# Patient Record
Sex: Male | Born: 1945
Health system: Southern US, Community
[De-identification: ages and names within clinical notes are randomized; demographics above are authoritative.]

## PROBLEM LIST (undated history)

## (undated) ENCOUNTER — Emergency Department (HOSPITAL_COMMUNITY): Admission: EM | Source: Home / Self Care

## (undated) DIAGNOSIS — E785 Hyperlipidemia, unspecified: Secondary | ICD-10-CM

## (undated) DIAGNOSIS — N529 Male erectile dysfunction, unspecified: Secondary | ICD-10-CM

## (undated) DIAGNOSIS — Z860101 Personal history of adenomatous and serrated colon polyps: Secondary | ICD-10-CM

## (undated) DIAGNOSIS — Z86718 Personal history of other venous thrombosis and embolism: Secondary | ICD-10-CM

## (undated) DIAGNOSIS — Z972 Presence of dental prosthetic device (complete) (partial): Secondary | ICD-10-CM

## (undated) DIAGNOSIS — I2510 Atherosclerotic heart disease of native coronary artery without angina pectoris: Secondary | ICD-10-CM

## (undated) DIAGNOSIS — I440 Atrioventricular block, first degree: Secondary | ICD-10-CM

## (undated) DIAGNOSIS — R351 Nocturia: Secondary | ICD-10-CM

## (undated) DIAGNOSIS — I491 Atrial premature depolarization: Secondary | ICD-10-CM

## (undated) DIAGNOSIS — I252 Old myocardial infarction: Secondary | ICD-10-CM

## (undated) DIAGNOSIS — Z86010 Personal history of colonic polyps: Secondary | ICD-10-CM

## (undated) DIAGNOSIS — Z7901 Long term (current) use of anticoagulants: Secondary | ICD-10-CM

## (undated) DIAGNOSIS — K449 Diaphragmatic hernia without obstruction or gangrene: Secondary | ICD-10-CM

## (undated) DIAGNOSIS — R42 Dizziness and giddiness: Secondary | ICD-10-CM

## (undated) DIAGNOSIS — K219 Gastro-esophageal reflux disease without esophagitis: Secondary | ICD-10-CM

## (undated) DIAGNOSIS — Z955 Presence of coronary angioplasty implant and graft: Secondary | ICD-10-CM

## (undated) DIAGNOSIS — I10 Essential (primary) hypertension: Secondary | ICD-10-CM

## (undated) DIAGNOSIS — Z86711 Personal history of pulmonary embolism: Secondary | ICD-10-CM

## (undated) HISTORY — PX: CORONARY ANGIOPLASTY WITH STENT PLACEMENT: SHX49

## (undated) HISTORY — PX: CARDIOVASCULAR STRESS TEST: SHX262

## (undated) HISTORY — DX: Atherosclerotic heart disease of native coronary artery without angina pectoris: I25.10

---

## 2003-11-30 ENCOUNTER — Ambulatory Visit (HOSPITAL_COMMUNITY): Admission: RE | Admit: 2003-11-30 | Discharge: 2003-11-30 | Payer: Self-pay | Admitting: Internal Medicine

## 2005-01-19 ENCOUNTER — Ambulatory Visit: Payer: Self-pay | Admitting: Orthopedic Surgery

## 2005-06-14 ENCOUNTER — Inpatient Hospital Stay (HOSPITAL_COMMUNITY): Admission: EM | Admit: 2005-06-14 | Discharge: 2005-06-17 | Payer: Self-pay | Admitting: Emergency Medicine

## 2005-06-15 DIAGNOSIS — I252 Old myocardial infarction: Secondary | ICD-10-CM

## 2005-06-15 DIAGNOSIS — Z955 Presence of coronary angioplasty implant and graft: Secondary | ICD-10-CM

## 2005-06-15 HISTORY — DX: Old myocardial infarction: I25.2

## 2005-06-15 HISTORY — DX: Presence of coronary angioplasty implant and graft: Z95.5

## 2005-07-31 ENCOUNTER — Encounter (HOSPITAL_COMMUNITY): Admission: RE | Admit: 2005-07-31 | Discharge: 2005-08-30 | Payer: Self-pay | Admitting: Cardiovascular Disease

## 2005-08-31 ENCOUNTER — Encounter (HOSPITAL_COMMUNITY): Admission: RE | Admit: 2005-08-31 | Discharge: 2005-09-30 | Payer: Self-pay | Admitting: Cardiovascular Disease

## 2005-10-02 ENCOUNTER — Encounter (HOSPITAL_COMMUNITY): Admission: RE | Admit: 2005-10-02 | Discharge: 2005-11-01 | Payer: Self-pay | Admitting: Cardiovascular Disease

## 2005-10-09 HISTORY — PX: CARDIAC CATHETERIZATION: SHX172

## 2005-11-03 ENCOUNTER — Encounter (HOSPITAL_COMMUNITY): Admission: RE | Admit: 2005-11-03 | Discharge: 2005-12-03 | Payer: Self-pay | Admitting: Cardiovascular Disease

## 2006-10-19 ENCOUNTER — Observation Stay (HOSPITAL_COMMUNITY): Admission: EM | Admit: 2006-10-19 | Discharge: 2006-10-23 | Payer: Self-pay | Admitting: Emergency Medicine

## 2006-10-22 HISTORY — PX: CARDIAC CATHETERIZATION: SHX172

## 2008-03-10 ENCOUNTER — Encounter (INDEPENDENT_AMBULATORY_CARE_PROVIDER_SITE_OTHER): Payer: Self-pay | Admitting: General Surgery

## 2008-03-10 HISTORY — PX: LAPAROSCOPIC APPENDECTOMY: SUR753

## 2008-03-11 ENCOUNTER — Inpatient Hospital Stay (HOSPITAL_COMMUNITY): Admission: EM | Admit: 2008-03-11 | Discharge: 2008-03-11 | Payer: Self-pay | Admitting: Emergency Medicine

## 2008-12-18 ENCOUNTER — Other Ambulatory Visit: Admission: RE | Admit: 2008-12-18 | Discharge: 2008-12-18 | Payer: Self-pay | Admitting: Family Medicine

## 2010-02-21 ENCOUNTER — Ambulatory Visit (HOSPITAL_COMMUNITY): Admission: RE | Admit: 2010-02-21 | Discharge: 2010-02-21 | Payer: Self-pay | Admitting: Family Medicine

## 2010-05-22 ENCOUNTER — Encounter: Payer: Self-pay | Admitting: Family Medicine

## 2010-07-29 ENCOUNTER — Encounter: Payer: Self-pay | Admitting: Gastroenterology

## 2010-08-01 ENCOUNTER — Ambulatory Visit (INDEPENDENT_AMBULATORY_CARE_PROVIDER_SITE_OTHER): Payer: Medicare Other | Admitting: Gastroenterology

## 2010-08-01 ENCOUNTER — Encounter: Payer: Self-pay | Admitting: Gastroenterology

## 2010-08-01 VITALS — BP 121/77 | HR 72 | Temp 98.0°F | Ht 69.0 in | Wt 229.0 lb

## 2010-08-01 DIAGNOSIS — K219 Gastro-esophageal reflux disease without esophagitis: Secondary | ICD-10-CM

## 2010-08-01 DIAGNOSIS — Z800 Family history of malignant neoplasm of digestive organs: Secondary | ICD-10-CM

## 2010-08-01 DIAGNOSIS — K6289 Other specified diseases of anus and rectum: Secondary | ICD-10-CM

## 2010-08-01 DIAGNOSIS — K625 Hemorrhage of anus and rectum: Secondary | ICD-10-CM

## 2010-08-01 MED ORDER — PEG 3350-KCL-NA BICARB-NACL 420 G PO SOLR: ORAL | Status: AC

## 2010-08-01 NOTE — Assessment & Plan Note (Signed)
Significant family history of colorectal cancer. Mother deceased in her 28s, 2 paternal uncles deceased in their 58s all from the disease. Brother, Melford Tullier, recently diagnosed with colorectal cancer at age 65. It has been over 6 years since patient's last colonoscopy. Colonoscopy in the near future.

## 2010-08-01 NOTE — Progress Notes (Signed)
Discussed with Dr. Jena Gauss. Patient will continue his Plavix and aspirin prior to colonoscopy.

## 2010-08-01 NOTE — Progress Notes (Signed)
Primary Care Physician:  [name], MD  Chief Complaint  Patient presents with  . Rectal Pain    burning during BM around anus    HPI:  Steven Oneal is a 65 y.o. male here for consideration of colonoscopy. His brother, Candler Ginsberg, was recently diagnosed with colorectal cancer at the age of 52. Patient also has a history of 2 paternal uncles and mother who are deceased due to colon cancer in their 66s. Patient's last colonoscopy was in August of 2005. He had pancolonic diverticula, internal hemorrhoids, single anal papila. Patient states his bowels are always loose. Lot of rectal burning with BMs for past couple of months. Tried suppositories for hemorrhoids which helped some. Sometimes sees moderate volume hematachezia. Has happened several times. Appetite good. Heartburn controlled. No dysphagia, n/v. Some lower abdominal soreness. Chronic GERD over 20 years. Has been on PPI chronically. No prior upper endoscopy.   Current Outpatient Prescriptions  Medication Sig Dispense Refill  . amLODipine (NORVASC) 5 MG tablet Take 5 mg by mouth daily.        Marland Kitchen aspirin 81 MG tablet Take 81 mg by mouth daily.        . clopidogrel (PLAVIX) 75 MG tablet Take 75 mg by mouth daily.        . diazepam (VALIUM) 10 MG tablet Take 10 mg by mouth every 6 (six) hours as needed.        . fish oil-omega-3 fatty acids 1000 MG capsule Take 3 g by mouth daily.        Marland Kitchen losartan (COZAAR) 50 MG tablet Take 50 mg by mouth daily.        . nebivolol (BYSTOLIC) 10 MG tablet Take 10 mg by mouth daily.        . niacin (NIASPAN) 1000 MG CR tablet Take 1,000 mg by mouth at bedtime.        Marland Kitchen omeprazole (PRILOSEC) 20 MG capsule Take 20 mg by mouth daily.        . pravastatin (PRAVACHOL) 40 MG tablet Take 40 mg by mouth daily.                                                           Allergies as of 08/01/2010 - Review Complete 08/01/2010  Allergen Reaction Noted  . Codeine Itching, Nausea Only, and Rash  08/01/2010    Past Medical History  Diagnosis Date  . Allergic rhinitis   . Acid reflux   . CAD (coronary artery disease)     stents in 2007  . Hypertension   . Hyperlipidemia     Past Surgical History  Procedure Date  . Appendectomy   . Cardiac surgery     two stents placed through cardiac cath, 2007/2007  . Colonoscopy 11/2003    single anal papilla and int hemorrhoids, pancolonic tics    Family History  Problem Relation Age of Onset  . Colon cancer Mother 53  . Colon cancer Paternal Uncle 66  . Colon cancer Brother 9    rectal cancer  . Colon cancer Paternal Uncle 59  . Throat cancer Brother 55  . Prostate cancer Brother     History   Social History  . Marital Status: Married    Spouse Name: N/A    Number of Children:  2  . Years of Education: N/A   Occupational History  . disablity     CAD   Social History Main Topics  . Smoking status: Never Smoker   . Smokeless tobacco: Never Used  . Alcohol Use: No  . Drug Use: No  . Sexually Active: Yes -- Male partner(s)    Birth Control/ Protection: None     spouse    ROS:  General: Negative for anorexia, weight loss, fever, chills, fatigue, weakness. Eyes: Negative for vision changes.  ENT: Negative for hoarseness, difficulty swallowing , nasal congestion. CV: Negative for chest pain, angina, palpitations, dyspnea on exertion, peripheral edema.  Respiratory: Negative for dyspnea at rest, dyspnea on exertion, cough, sputum, wheezing.  GI: See history of present illness. GU:  Negative for dysuria, hematuria, urinary incontinence, urinary frequency, nocturnal urination.  MS: Negative for joint pain, low back pain.  Derm: Negative for rash or itching.  Neuro: Negative for weakness, abnormal sensation, seizure, frequent headaches, memory loss, confusion.  Psych: Negative for anxiety, depression, suicidal ideation, hallucinations.  Endo: Negative for unusual weight change.  Heme: Negative for bruising or  bleeding. Allergy: Negative for rash or hives.    Physical Examination: BP 121/77  Pulse 72  Temp 98 F (36.7 C)  Ht 5\' 9"  (1.753 m)  Wt 229 lb (103.874 kg)  BMI 33.82 kg/m2  SpO2 97%   General: Well-nourished, well-developed in no acute distress.  Head: Normocephalic, atraumatic.   Eyes: Conjunctiva pink, no icterus. Mouth: Oropharyngeal mucosa moist and pink , no lesions erythema or exudate. Neck: Supple without thyromegaly, masses, or lymphadenopathy.  Lungs: Clear to auscultation bilaterally.  Heart: Regular rate and rhythm, no murmurs rubs or gallops.  Abdomen: Bowel sounds are normal, nontender, nondistended, no hepatosplenomegaly or masses, no abdominal bruits or    hernia , no rebound or guarding.   Extremities: No lower extremity edema.  Neuro: Alert and oriented x 4 , grossly normal neurologically.  Skin: Warm and dry, no rash or jaundice.   Psych: Alert and cooperative, normal mood and affect.

## 2010-08-01 NOTE — Assessment & Plan Note (Signed)
Several month history of chronic rectal burning and prior history of internal hemorrhoids. May be due to benign anorectal disease such as hemorrhoids or fissure. Needs colonoscopy.

## 2010-08-01 NOTE — Assessment & Plan Note (Signed)
Intermittent moderate volume hematochezia. Colonoscopy in the near future. We'll continue his aspirin and Plavix for now. Will discuss further with Dr. Jena Gauss, but given history of coronary artery disease and stents and without recent bleeding will likely continue Plavix and aspirin at time of procedure. Patient will let us know if he has recurrent GI bleeding. I have discussed the risks, alternatives, benefits with regards to but not limited to the risk of reaction to medication, bleeding, infection, perforation and the patient is agreeable to proceed. Written consent to be obtained.

## 2010-08-01 NOTE — Assessment & Plan Note (Signed)
Chronic GERD on chronic PPI. He has never had an upper endoscopy. Would offer one at this time to rule out complications of GERD such as Barrett's esophagus. I have discussed the risks, alternatives, benefits with regards to but not limited to the risk of reaction to medication, bleeding, infection, perforation and the patient is agreeable to proceed. Written consent to be obtained.

## 2010-08-09 ENCOUNTER — Encounter: Payer: Medicare Other | Admitting: Internal Medicine

## 2010-08-09 ENCOUNTER — Other Ambulatory Visit: Payer: Self-pay | Admitting: Internal Medicine

## 2010-08-09 ENCOUNTER — Ambulatory Visit (HOSPITAL_COMMUNITY)
Admission: RE | Admit: 2010-08-09 | Discharge: 2010-08-09 | Disposition: A | Discharge status: Home / Self Care | Payer: Medicare Other | Source: Ambulatory Visit | Attending: Internal Medicine | Admitting: Internal Medicine

## 2010-08-09 DIAGNOSIS — D126 Benign neoplasm of colon, unspecified: Secondary | ICD-10-CM | POA: Insufficient documentation

## 2010-08-09 DIAGNOSIS — Z7982 Long term (current) use of aspirin: Secondary | ICD-10-CM | POA: Insufficient documentation

## 2010-08-09 DIAGNOSIS — K625 Hemorrhage of anus and rectum: Secondary | ICD-10-CM | POA: Insufficient documentation

## 2010-08-09 DIAGNOSIS — K449 Diaphragmatic hernia without obstruction or gangrene: Secondary | ICD-10-CM

## 2010-08-09 DIAGNOSIS — Z860100 Personal history of colon polyps, unspecified: Secondary | ICD-10-CM | POA: Insufficient documentation

## 2010-08-09 DIAGNOSIS — Z79899 Other long term (current) drug therapy: Secondary | ICD-10-CM | POA: Insufficient documentation

## 2010-08-09 DIAGNOSIS — Z800 Family history of malignant neoplasm of digestive organs: Secondary | ICD-10-CM | POA: Insufficient documentation

## 2010-08-09 DIAGNOSIS — I10 Essential (primary) hypertension: Secondary | ICD-10-CM | POA: Insufficient documentation

## 2010-08-09 DIAGNOSIS — K5730 Diverticulosis of large intestine without perforation or abscess without bleeding: Secondary | ICD-10-CM

## 2010-08-15 NOTE — Op Note (Signed)
NAME:  Steven Oneal, Steven Oneal            ACCOUNT NO.:  [id number]  MEDICAL RECORD NO.:  [id number]           PATIENT TYPE:  O  LOCATION:  DAYP                          FACILITY:  [facility]  PHYSICIAN:  R. Roetta Sessions, M.D. DATE OF BIRTH:  Mar 19, 1946  DATE OF PROCEDURE:  08/09/2010 DATE OF DISCHARGE:                              OPERATIVE REPORT   PROCEDURE:  Diagnostic EGD followed by colonoscopy with biopsy and snare polypectomy.  INDICATIONS FOR PROCEDURE:  A 65 year old gentleman with intermittent low-volume rectal bleeding, intermittent rectal burning, multiple family members with colon polyps, brother recently diagnosed with colorectal cancer, age less than 60; reflux symptoms longstanding, well-controlled on Prilosec 20 mg orally daily.  EGD and colonoscopy now being done. Risks, benefits, limitations, alternatives, imponderables have been discussed, questions answered.  Please see the documentation in the medical record.  PROCEDURE NOTE:  O2 saturation, blood pressure, pulse, respirations were monitored throughout the entire procedure.  CONSCIOUS SEDATION:  Versed 5 mg IV, Demerol 100 mg IV in divided doses.  INSTRUMENT:  Pentax video chip system.  Cetacaine spray for topical pharyngeal anesthesia.  FINDINGS:  EGD examination of tubular esophagus revealed normal mucosa. There was no evidence of Barrett's esophagus or other abnormality.  EG junction was easily traversed.  Stomach:  Gastric cavity was emptied and insufflated well with air. Thorough examination of gastric mucosa including retroflexion of proximal stomach, esophagogastric junction demonstrated only small hiatal hernia.  Pylorus was patent, easily traversed.  Examination of bulb and second portion revealed no abnormalities.  THERAPEUTIC/DIAGNOSTIC MANEUVERS PERFORMED:  None.  The patient tolerated the procedure well and was prepared for colonoscopy.  Digital rectal exam revealed no abnormalities.   Endoscopic findings:  Prep was adequate.  Colon:  Colonic mucosa was surveyed from the rectosigmoid junction through the left transverse right colon to the appendiceal orifice, ileocecal valve/cecum.  These structures were well seen and photographed for the record.  From this level, the scope was slowly and cautiously withdrawn.  All previously mucosal surfaces were again seen.  The patient did not have pancolonic diverticulosis.  There was a single dimunitive polyp in the transverse colon which was cold biopsied/removed.  In this sigmoid colon, there was a 8-mm polyp on a stalk which was hot snared and recovered for the pathologist.  The remainder of colonic mucosa appeared normal.  Scope was pulled down to the rectum where a thorough examination of rectal mucosa including retroflexed view of the anal verge.  On en face view, the anal canal demonstrated a single anal canal tag only.  The patient tolerated the procedure well.  Cecal withdrawal time 12 minutes.  IMPRESSION: 1. EGD normal esophagus, small hiatal hernia, otherwise normal stomach D1 and D2. 2. Colonoscopy findings:  Single anal canal tag, otherwise normal     rectum. 3. Pancolonic diverticula, diminutive polyp, transverse colon status     post cold biopsy removal, polyp in the sigmoid status post hot     snare polypectomy.  RECOMMENDATIONS: 1. Stop aspirin for 5 days.  The patient is to continue Plavix.  The     patient is to resume aspirin in 6 days. 2.  Daily fiber supplement recommended. 3. A 10-day course of Anusol-HC suppositories one per rectum bedtime. 4. Literature of hiatal hernia and reflux provided to Steven Oneal. 5. Prilosec has controlled the symptoms nicely, I feel he should     continue that regimen.  I feel the benefits outweigh the risks.     [name], M.D.     RMR/MEDQ  D:  08/09/2010  T:  08/10/2010  Job:  [id number]  cc:   [facility]  Electronically Signed by Lorrin Goodell  M.D. on 08/15/2010 12:34:31 PM

## 2010-08-21 ENCOUNTER — Encounter: Payer: Self-pay | Admitting: Internal Medicine

## 2010-09-13 NOTE — H&P (Signed)
NAME:  Steven, RAMSBURG NO.:  [id number]   MEDICAL RECORD NO.:  [id number]          PATIENT TYPE:  INP   LOCATION:  A312                          FACILITY:  [facility]   PHYSICIAN:  Tilford Pillar, MD      DATE OF BIRTH:  [birth year]   DATE OF ADMISSION:  03/10/2008  DATE OF DISCHARGE:  LH                              HISTORY & PHYSICAL   CHIEF COMPLAINT:  Right lower quadrant abdominal pain.   HISTORY OF PRESENT ILLNESS:  The patient is a 65 year old male with a  history of coronary artery disease, hypertension, anxiety, who presents  with right lower quadrant abdominal pain that started approximately 36  hours ago.  He described this as an acute onset in the periumbilical  region soon after eating lunch yesterday.  He has had a decrease in his  appetite since that time.  He has noticed a localization of the pain  from the periumbilical regions of the right lower quadrant.  The pain is  worse with movement and palpation.  There is no significant relieving  factors or features.  He denied any fever or chills.  He has had some  nausea but no episodes of emesis.  No change in bowel movements.  No  hematochezia.  No melena.  He has had no similar symptomatology in the  past.  He denies any shortness of breath or chest pain.   PAST MEDICAL HISTORY:  1. Hypertension.  2. Coronary artery disease with stents.  The patient had an MI in      February 2009.  3. Anxiety.   PAST SURGICAL HISTORY:  He has had coronary artery stents placed in  February 2009.   Medications are multiple and reviewed including Plavix, aspirin,  diazepam, lisinopril, isosorbide, metoprolol, Niaspan, nitroglycerin  p.r.n., simvastatin, AndroGel, Prilosec, amlodipine.   ALLERGIES:  CODEINE which causes a rash and pruritus.   SOCIAL HISTORY:  No tobacco.  No alcohol.  No recreational drug use.   REVIEW OF SYSTEMS:  CONSTITUTIONAL:  No recent weight loss.  No history  of headaches.  EYES:   Unremarkable.  EAR, NOSE, AND THROAT:  Unremarkable.  RESPIRATORY:  Unremarkable.  CARDIOVASCULAR:  He did have  a history of acute myocardial infarction in February of this year.  He  had coronary artery stenting at that time.  His last Cardiology  presentation was approximately 1-2 months ago.  The patient states that  he was told that everything was within normal limits at that  presentation.  No concerns were expressed to him at that time by his  cardiologist.  He has had no recent chest pain.  No palpitations.  GASTROINTESTINAL:  As per HPI, otherwise unremarkable.  GENITOURINARY:  Unremarkable.  MUSCULOSKELETAL:  Unremarkable.  NEUROLOGIC:  Unremarkable.  ENDOCRINE:  Unremarkable.   PHYSICAL EXAMINATION:  VITAL SIGNS:  Temperature 101.6, heart rate 100-  115, respirations 18, blood pressure 132/70.  GENERAL:  The patient is an obese-appearing male in no acute distress.  He is alert and oriented x3.  He is in a supine position in emergency  room cart.  HEENT:  Scalp no deformities.  Pupils are equal, round, and reactive.  Extraocular movements are intact.  No conjunctival pallor is noted.  Oral mucosa is pink and moist.  NECK:  Trachea is midline.  No cervical lymphadenopathy is apparent.  PULMONARY:  Unlabored respirations.  She is clear to auscultation  bilaterally.  CARDIOVASCULAR:  Tachycardic, but regular rhythm.  He has 2+ radial  pulses bilaterally.  ABDOMEN:  Bowel sounds are absent.  His abdomen is soft, obese, mildly  distended.  He does have right lower quadrant abdominal pain at McBurney  point.  He does have a positive Rovsing sign.  He has no diffuse  peritoneal signs.  No masses.  No hernias are appreciated.  EXTREMITIES:  Warm and dry.   PERTINENT LABORATORY AND RADIOGRAPHIC STUDIES:  CBC; white blood cell  count 11.5, hemoglobin 15.3, hematocrit 44.2, platelets 182, neutrophils  78, lymphocytes of 12, monocytes of 10.  Basic metabolic panel; sodium  136,  potassium 3.6, chloride 101, bicarb 26, BUN 5, creatinine 0.70,  blood glucose is elevated at 203.  CT of the abdomen and pelvis  demonstrated no evidence of any free air or free fluid.  There was  inflammatory changes in the right lower quadrant and the pericecal area  with positive fat stranding noted.  There is appendiceal dilatation and  wall thickening of the appendix.  No periappendiceal fluid is noted.  No  other masses or abnormalities are noted.   ASSESSMENT AND PLAN:  Acute appendicitis.  At this point, I did have a  discussion with the patient and the patient's wife who is present at the  time of evaluation regarding the physical examination and CT findings,  which are consistent with acute appendicitis.  I did discuss at length  the risks, benefits, and alternatives of laparoscopic possible open  appendectomy including but not limited to risk of bleeding, infection,  intra-abdominal abscess, anastomotic leak, as well as the possibility of  intraoperative cardiac and pulmonary events.  I did also discussed with  the patient his current medications including Plavix and aspirin and  discussed with the patient that this does not place him at an increased  risk for perioperative bleeding.  At this time, however due to the  emergent nature of his appendicitis, I did discuss with the patient that  these would be monitored closely and address if develop any difficulties  with postoperative or perioperative bleeding.  The patient's questions  and concerns were addressed and the patient will be consented for the  planned procedure, which includes a laparoscopic possible open  appendectomy.  This will be conducted in emergent fashion.      Tilford Pillar, MD  Electronically Signed     BZ/MEDQ  D:  03/10/2008  T:  03/11/2008  Job:  [id number]

## 2010-09-13 NOTE — Cardiovascular Report (Signed)
NAME:  Steven Oneal, Steven Oneal NO.:  [id number]   MEDICAL RECORD NO.:  [id number]          PATIENT TYPE:  INP   LOCATION:  2008                         FACILITY:  [facility]   PHYSICIAN:  Nanetta Batty, M.D.   DATE OF BIRTH:  20-Jun-1945   DATE OF PROCEDURE:  10/22/2006  DATE OF DISCHARGE:                            CARDIAC CATHETERIZATION   INDICATIONS:  Mr. Awbrey is a 64 year old, white male with history of  CAD, status post inferior wall myocardial infarction on June 15, 2005, treated with a Cypher drug-eluting stents by Dr. Lenise Herald.  He had moderate, but not critical disease in his LAD and OD.  His other  problems include hypertension and hyperlipidemia.  He was admitted on  June 20, with chest pain.  He ruled out for myocardial infarction.  He  had negative enzymes and was placed on Lovenox.  He presents now for  diagnostic coronary arteriography to define his anatomy and rule out  ischemic etiology.   DESCRIPTION OF PROCEDURE:  The patient was brought to the second floor  [facility] lab in a postabsorptive state.  He was premedicated  with p.o. Valium.  He was prepped and draped in the usual sterile  fashion.  Xylocaine 1% was used for local anesthesia.  A 6-French sheath  was inserted into the right femoral artery using standard Seldinger  technique.  A 6-French, right and left Judkins diagnostic catheter as  well as [place] pigtail catheter were used for selective coronary  angiography and left ventriculography respectively.  Visipaque dye was  used for entirety of the case.  Retrograde aortic and left ventricular  blood pressures were recorded.   HEMODYNAMIC RESULTS:  1. Aortic systolic pressure 136, diastolic pressure 66.  2. Left ventricular systolic pressure 133 and diastolic pressure 8.   Selective cholangiography:  1. Left main normal.  2. LAD:  LAD had a smooth segmental 60% proximal stenosis.  3. Left circumflex:  Free of  significant disease.  4. Ramus branch:  Free of significant disease.  5. Right coronary artery:  Patent stents in the proximal segment with      40% fairly focal stenosis just prior to that, 40% in the genu and      crux of the vessel.   Left ventriculography:  Left ventriculogram was performed using 25 mL of  Visipaque dye at 12 mL per second.  The overall LVEF was estimated at  greater than 60% without focal wall motion abnormalities.   IMPRESSION:  Mr. Samons has patent stents with noncritical coronary  artery disease.  I am not sure of the etiology of his chest pain.  Empiric antireflux therapy will be recommended as well as outpatient  Myoview to assess  the physiologic significance of his remaining coronary disease.  Sheath  was removed and pressure of the groin used to achieve hemostasis.  The  patient left in stable condition.  He will be discharged home later  tomorrow.  He will see me back in the office in approximately 2 weeks  for followup.      Nanetta Batty, M.D.  Electronically Signed  JB/MEDQ  D:  10/22/2006  T:  10/23/2006  Job:  [id number]   cc:   [facility]  [facility]  Patrica Duel, M.D.

## 2010-09-13 NOTE — H&P (Signed)
NAME:  Steven Oneal, Steven Oneal NO.:  [id number]   MEDICAL RECORD NO.:  [id number]          PATIENT TYPE:  INP   LOCATION:  6532                         FACILITY:  [facility]   PHYSICIAN:  Steven Oneal, M.D.   DATE OF BIRTH:  17-Apr-1946   DATE OF ADMISSION:  10/19/2006  DATE OF DISCHARGE:                              HISTORY & PHYSICAL   CHIEF COMPLAINTS:  Chest pain.   HISTORY OF PRESENT ILLNESS:  Mr. Steven Oneal is a 65 year old male  followed by Dr. Allyson Oneal and Dr. Patrica Oneal.  He has a history of  coronary disease.  He had an ST MI in February of 2007, treated with 2  RCA Cypher stents.  He was restudied in June of 2007 because of  recurrent chest pain.  Catheterization at that time showed a 70% LAD,  70% distal RCA, 70% optional diagonal and 80% diagonal; plan was for  medical therapy.  He did have a Myoview after his catheterization that  showed old inferior scar with no ischemia.  The patient has done pretty  well on medical therapy until this morning when he developed some left-  sided chest pain with shortness of breath.  He says his symptoms are  similar to his pre-PCI symptoms.  He became anxious.  He took  nitroglycerin with some relief.  He presented to Dr. Geanie Oneal office  and is now transferred here by EMS from Dr. Geanie Oneal office.  He is  almost pain free now.   His past medical history is remarkable for treated hypertension.  He  does have a history of anxiety.  He has had remote peptic ulcer disease  but no recent bleeding.  He has a history of dyslipidemia.  He has a  history of colonoscopy by Dr. Jena Oneal in 2005.   CURRENT MEDICATIONS:  1. Lisinopril 10 mg a day.  2. Metoprolol 50 mg b.i.d.  3. Prilosec 20 mg a day.  4. Plavix 75 mg a day.  5. Aspirin 162 mg a day.  6. Zocor 40 mg a day.  7. Imdur 30 mg a day.   HE IS INTOLERANT TO CODEINE BUT HAS NO OTHER DRUG ALLERGIES.   SOCIAL HISTORY:  Is married.  He is a nonsmoker.  He has 2 children  and  4 grandchildren.   FAMILY HISTORY:  His father died 11 of an MI; there is a family history  of colon cancer.   REVIEW OF SYSTEMS:  Essentially unremarkable except for noted above.  He  denies any recent melena or GI bleeding.  Has not had prostate trouble  or kidney trouble.  He has not had any trouble voiding.  He denies any  recent fever or chills.   PHYSICAL EXAMINATION:  Blood pressure was 160/80, pulse 70, respirations  12.  GENERAL:  He is a well-developed, well-nourished male in no acute  distress.  HEENT:  Normocephalic.  Extraocular movements are intact. Sclerae  nonicteric. Lids and conjunctivae within normal limits.  NECK:  Without bruit or JVD.  CHEST:  Clear to auscultation and percussion.  CARDIAC:  Reveals a regular rate and rhythm without murmur,  rub or  gallop.  Normal S1/S2.  ABDOMEN:  Nontender.  No hepatosplenomegaly.  EXTREMITIES:  Without edema.  Distal pulses are intact.  NEUROLOGICAL EXAM:  Grossly intact.  He is awake, alert, oriented,  cooperative.  Moves all extremities without obvious deficit.   His EKG shows sinus rhythm without acute changes.   IMPRESSION:  1. Unstable angina.  2. Coronary disease, right coronary artery percutaneous coronary      intervention with drug-eluting stents x2 February of 2007 with      moderate coronary disease on restudy June of 2007.  3. Treated hypertension.  4. Treated dyslipidemia.  5. History of anxiety.   PLAN:  The patient is admitted to telemetry.  He will be started on IV  nitroglycerin and Lovenox.  We will plan on restudy Monday.  The patient  was seen by Dr. Allyson Oneal and myself in the emergency room.      Steven Oneal, P.A.      Steven Oneal, M.D.  Electronically Signed    LKK/MEDQ  D:  10/19/2006  T:  10/20/2006  Job:  [id number]

## 2010-09-13 NOTE — Discharge Summary (Signed)
NAME:  Steven Oneal, Steven Oneal NO.:  [id number]   MEDICAL RECORD NO.:  [id number]          PATIENT TYPE:  INP   LOCATION:  2008                         FACILITY:  [facility]   PHYSICIAN:  Nanetta Batty, M.D.   DATE OF BIRTH:  12/19/1945   DATE OF ADMISSION:  10/19/2006  DATE OF DISCHARGE:                               DISCHARGE SUMMARY   DISCHARGE DIAGNOSES:  1. Coronary artery disease.  2. Chest pain.  3. Status post right coronary artery Cypher stent post ST-elevation      myocardial infarction in February 2007.  4. Hypertension.  5. Anxiety.  6. Dyslipidemia.  7. Noncritical coronary disease on catheterization.   PROCEDURE:  Left heart catheterization and coronary arteriogram were  performed on October 22, 2006, by Dr. Nanetta Batty.  Coronary arteriogram  revealed normal left main, 60% proximal stenosis of the LAD.  Circumflex  was free of significant disease.  The ramus branch was free of  significant disease.  The right coronary artery revealed patent stents  in the proximal vessel with 40% stenosis just prior to that, 40% in the  genu and crux of the vessel.  Left ventriculogram revealed an ejection  fraction of approximately 60% without wall motion abnormalities   BRIEF HISTORY:  Steven Oneal is a 65 year old male with a history of  coronary artery disease.  He is status post myocardial infarction in  February 2007 at which time he was treated with two Cypher stents to the  LAD.  He has experienced recurrent chest pain in the past which revealed  noncritical disease and medical therapy was recommended.  Followup of  that catheterization with Myoview stress test also revealed inferior  scar with no ischemia.  On the day of admission October 19, 2006, he  reported to the emergency department with left-sided chest discomfort  accompanied by shortness of breath very similar to his symptoms he had  experienced in the past.  Please see dictated history and physical for  further details.   HOSPITAL COURSE:  Steven Oneal was admitted to unit 2000 in stable  condition.  He was started on IV nitroglycerin as well as Lovenox and  plans were made to proceed with cardiac catheterization on Monday to  evaluate for any progression of his coronary artery disease.  There were  no acute changes on his EKG and he remained stable through the weekend.  His catheterization was performed on October 22, 2006, with the findings as  listed above and it was recommended that he continue his medical  management and repeat a stress Myoview as an outpatient to confirm no  evidence of ischemia, and followup by Dr. Allyson Sabal in 2 weeks.   DISCHARGE INSTRUCTIONS:  He is to keep his groin site clean and dry.  He  may shower ;ate today.  He is to keep the groin site clean and dry and  contact us with redness, swelling or drainage from the site or with  increased pain.  His followup appointments will be scheduled by our  office.  He is to see Dr. Allyson Sabal in 2 weeks and undergo stress testing  within the next two weeks prior to his visit with Dr. Allyson Sabal.   DISCHARGE MEDICATIONS:  1. Plavix 75 mg daily.  2. Metoprolol 50 mg one b.i.d.  3. Lisinopril 10 mg daily.  4. Zocor 40 mg q.h.s.  5. Prilosec OTC daily.  6. Aspirin 81 mg daily.  7. Sublingual nitroglycerin p.r.n.  8. Imdur 30 mg daily.  9. Diazepam 30 mg q.h.s.   ACTIVITIES:  He is to avoid lifting greater than 10 pounds for the next  2 weeks and avoid driving for the next 2 days.     ______________________________  Charmian Muff, NP      Nanetta Batty, M.D.  Electronically Signed    LS/MEDQ  D:  10/23/2006  T:  10/23/2006  Job:  [id number]   cc:   Patrica Duel, M.D.

## 2010-09-13 NOTE — Op Note (Signed)
NAME:  Steven Oneal, Steven Oneal NO.:  [id number]   MEDICAL RECORD NO.:  [id number]          PATIENT TYPE:  INP   LOCATION:  A312                          FACILITY:  [facility]   PHYSICIAN:  Tilford Pillar, MD      DATE OF BIRTH:  Nov 05, 1945   DATE OF PROCEDURE:  03/10/2008  DATE OF DISCHARGE:                               OPERATIVE REPORT   PREOPERATIVE DIAGNOSIS:  Acute appendicitis.   POSTOPERATIVE DIAGNOSIS:  Acute appendicitis.   PROCEDURE:  Laparoscopic appendectomy.   SURGEON:  Tilford Pillar, MD   ANESTHESIA:  General endotracheal local anesthetic 1% Sensorcaine plain.   SPECIMEN:  Appendix.   ESTIMATED BLOOD LOSS:  50 mL.   INDICATIONS:  The patient is a 65 year old male with multiple medical  problems including coronary artery disease with recent stent placed over  the last 6 months, who presented with acute abdominal pain.  He was  evaluated by the emergency room physician.  A CT evaluation of the  abdomen and pelvis was obtained, which confirmed acute appendicitis.  At  this time, surgical consultation was obtained.  I did discuss with the  patient and the patient's wife who was present during the time of the  evaluation, the risks, benefits, and alternatives of a laparoscopy,  possible open appendectomy.  I additionally discussed at length the  increased risks associated with bleeding secondarily to the patient's  history of recent Plavix and aspirin use for stent what explained the  emergent nature of his appendicitis.  He understood and wished to  proceed.  He consented for the planned procedure.   DESCRIPTION OF PROCEDURE:  The patient was taken to the operating room  and was placed in the supine position on the operating room table at  which time the general anesthetic was administered.  Once the patient  was asleep, he was endotracheally intubated by anesthesia.  At this  point, a Foley catheter was placed in standard sterile fashion by myself  without  any difficulty.  His abdomen was prepped with DuraPrep solution  and draped in standard fashion.  A stab incision was created  supraumbilically with an 11-blade scalpel, additional dissection down  through the subcutaneous tissue was carried out using a Kocher clamp,  which was utilized to grasp the anterior abdominal fascia and lifted  anteriorly.  A Veress needle was inserted.  Saline drop test was  utilized to confirm intraperitoneal placement and then pneumoperitoneum  was initiated.  Once sufficient pneumoperitoneum was obtained, an 11-mm  trocar was inserted over laparoscope allowing visualization of the  trocar entering into the peritoneal cavity.  The inner cannula was  removed.  The laparoscope was reinserted.  There was no evidence of any  trocar or Veress needle placement injury.  At this time, the remaining  trocars were placed with a 5-mm trocar in the suprapubic and a 11-mm  trocar in the left lateral abdominal wall.  These were placed with a  stab incision and placement of the trocars under direct visualization.  At this time, the cecum was identified.  There was significant amount of  inflammatory changes in  this area.  There was some omental tissue in  this area, which was well adherent.  Terminal ileum was identified and  this was mobilized and freed out of the way with gentle retraction.  The  patient was then placed in reverse Trendelenburg in left lateral  decubitus position to help with exposure.  Due to the patient's  severity, the closure was somewhat cleared with a 0-degree scope.  Therefore, a 30-degree scope was brought to the field to help with  angulation.  I could clearly see the base of the appendix as it entered  into the cecum at the termination of the taenia.  A window was created  at this point between the mesocolon and the appendix and an Endo-GIA 45  regular staple load was utilized by the base of the appendix at the  cecum.  At this point, I used the  vascular reload of the Endo-GIA 45  stapler to divide the mesoappendix.  I continued to dissect the distal  end of the appendix with a combination of blunt and suction-irrigated  dissection.  At this point, the specimen was freed and was placed in an  EndoCatch bag and removed through the umbilical trocar site.  At this  point, I reinspected the inflammatory phlegmon in this area.  There was  some oozing coming from the omental fat in this area, and at this time,  I opted to bring the harmonic scalpel to the field.  During this  continued dissection to obtain hemostasis, I visualized a distal tubular  structure adjacent to the cecum suspicious the appendix and its friable  nature might have been inadvertently divided as I continued to dissect  this free.  This clearly ran within the inflammatory tissue along the  right lateral abdominal wall, and based on its appearance, I did opt to  dissect this free using the harmonic scalpel.  With this free, I did opt  to place this also in the EndoCatch bag and remove this as well.  This  was removed in an intact EndoCatch bag and was placed with the appendix  on the back table to be sent as a permanent specimen.  I copiously  irrigated the abdominal cavity.  All the returning aspirate was clear.  I did inspect the area of dissection and did not see any active bleeding  in this area.  At this time, I turned my attention to closure.   Using an Endoclose suture passing device, I passed a 2-0 Vicryl suture  through the umbilical trocar site.  Due to limited space between the  anterior abdominal wall and the descending colon where the left lateral  abdominal trocar was located.  I opted not to use the Endoclose suture  passing device to reapproximate this prior to evacuating the  pneumoperitoneum.  I did inspect the staple line as well as the area of  dissection to reevaluate for any ongoing bleeding.  At this time, the  area appeared dry without any  active ongoing bleeding despite majority  of the patients being on the aspirin and Plavix combination.  At this  time, I opted to close.   The pneumoperitoneum was evacuated.  The trocars were removed.  The  previously placed umbilical Vicryl suture was secured.  Using a 2-0  Vicryl, the left lateral trocar site fascia was reapproximated using a  simple suture.  Local anesthetic was instilled.  A 4-0 Monocryl was  utilized to reapproximate the skin edges at all 3 trocar  sites.  The  skin was washed and dried with moist dry towel.  Benzoin was applied  around the incision.  Half-inch Steri-Strips were placed.  The drapes  were removed.  The patient was allowed to come out of general anesthetic  and was transferred back to regular hospital bed.  He was transferred to  post anesthetic area in stable condition.  At the conclusion of the  procedure, all instrument, sponge, and needle counts were correct.  On  the completion, I did examine the specimen on the back table.  I further  inspecting the appendix.  The appendix was within inflammatory phlegmon,  and therefore, the tip was somewhat obscured.  However, palpation of the  specimen did appear that the specimen was intact.  Further looking at  the second specimen, this was suspicious for a thrombosed vein likely  within the omental tongue that would appear in this area.  We will send  specimens to Pathology.      Tilford Pillar, MD  Electronically Signed     BZ/MEDQ  D:  03/11/2008  T:  03/11/2008  Job:  [id number]

## 2010-09-16 NOTE — Op Note (Signed)
NAME:  Steven Oneal, Steven Oneal                      ACCOUNT NO.:  [id number]   MEDICAL RECORD NO.:  [id number]                   PATIENT TYPE:  AMB   LOCATION:  DAY                                  FACILITY:  [facility]   PHYSICIAN:  R. Roetta Sessions, M.D.              DATE OF BIRTH:  1946-02-06   DATE OF PROCEDURE:  11/30/2003  DATE OF DISCHARGE:                                 OPERATIVE REPORT   PROCEDURE:  Colonoscopy with biopsy.   INDICATIONS:  The patient is a 65 year old gentleman with intermittent  __________  hematochezia with a positive family history of colon cancer in  two uncles and his mother who died of the disease at age 66.  He has never  had colonoscopy.  Colonoscopy is now being done and this approach has been  discussed with the patient at length.  The potential risks, benefits and  alternatives have been reviewed, questions answered and he is agreeable.  Please see documentation in medical record.   DESCRIPTION OF PROCEDURE:  Oxygen saturation, blood pressure, pulse, and  respiration were monitored throughout the entire procedure.  Conscious  sedation with Versed 4 mg IV and Demerol 75 mg IV in divided doses.  The  instrument was the Olympus video chip system.   FINDINGS:  Digital rectal exam initially revealed no abnormalities.   ENDOSCOPIC FINDINGS:  Prep was adequate.   Rectum:  Examination of the rectal mucosa including retroflexion in the anal  verge revealed a single anal papilla and internal hemorrhoids.  Rectal  mucosa otherwise appeared normal.   Colon:  Colonic mucosa was surveyed from the rectosigmoid junction through  the left, transverse,  right colon to the area of the appendiceal orifice,  ileocecal valve and cecum. These structures were well seen and photographed  for the record.  From this level,  the scope was slowly withdrawn.  All  previously mentioned mucosal surfaces were again seen.  The patient was  noted to have scattered pancolonic  diverticula.  The remainder of the  colonic mucosa appeared normal.  The patient tolerated the procedure well  and was reactive after endoscopy.   IMPRESSION:  1. Single anal papilla and internal hemorrhoids; otherwise normal rectum.  2. Pancolonic diverticula.  The remainder of the colonic mucosa appeared     normal.  3. I suspect the patient bled from hemorrhoids.   RECOMMENDATIONS:  1. Hemorrhoid and diverticulosis literature provided to Mr. Aikman.     __________  course of Anusol HC     suppositories one per rectum at bedtime.  2. The patient should go on a daily fiber supplement.  3. Given his family history of colon cancer, he should return for a     screening colonoscopy in five years.      ___________________________________________  [name], M.D.   RMR/MEDQ  D:  11/30/2003  T:  11/30/2003  Job:  [id number]   cc:   Patrica Duel, M.D.  [address], [address]  [place]  [place] [address]  Fax: [phone]

## 2010-09-16 NOTE — Cardiovascular Report (Signed)
NAME:  Steven Oneal, Steven Oneal NO.:  [id number]   MEDICAL RECORD NO.:  [id number]          PATIENT TYPE:  INP   LOCATION:  2917                         FACILITY:  [facility]   PHYSICIAN:  Darlin Priestly, MD  DATE OF BIRTH:  [birth year]   DATE OF PROCEDURE:  06/15/2005  DATE OF DISCHARGE:                              CARDIAC CATHETERIZATION   PROCEDURES PERFORMED:  1.  Left heart catheterization.  2.  Coronary angiography.  3.  Left ventriculogram.  4.  Right coronary artery-mid-proximal.      1.  Percutaneous transluminal coronary balloon angioplasty.      2.  Placement of intracoronary stent.   ATTENDING:  Darlin Priestly, M.D.   COMPLICATIONS:  None.   INDICATIONS:  Mr. Gibbons is a 65 year old male patient of Dr. Nobie Putnam  who was admitted on May 14, 2005 with a complaint of substernal chest  pain which initially began on the night of June 12, 2005.  He had  stuttering pain on and off for one or two days.  Upon his arrival to the ER  he had no significant EKG changes but he had relief of pain with  nitroglycerin.  He does subsequently rule in for MI with a peak troponin of  1.19, CK of 169 with a CK-MB of 26.9.  He is now brought for cardiac  catheterization to evaluate his coronary anatomy and possible PCI.   DESCRIPTION OF OPERATION:  After giving informed written consent, patient  brought to the cardiac catheterization laboratory.  Right and left groin  shaved, prepped, and draped in usual sterile fashion.  ECG monitor  established.  Using a modified Seldinger technique, a #6-French arterial  sheath inserted in right femoral artery.  A 6-French diagnostic catheter was  then used to perform diagnostic angiography.   Left main is a large vessel with no significant disease.   The LAD is a large vessel coursing the apex, gave rise to one diagonal  branch.  The LAD is noted to have a 70% proximal stenotic lesion.  The  remainder of the LAD does not  appear to have any significant disease.   The first diagonal was a small to medium sized vessel with 80%  ostial/proximal lesion.   Left circumflex is a large vessel coursing the AV groove and gave rise to  three obtuse marginal branches.  AV groove circumflex has no significant  disease.   The first OM was a large vessel which bifurcates distally with no  significant disease.   The second OM is a medium to large sized vessel which bifurcates distally  with a 70% mid vessel lesion.   The third OM is a small vessel with no significant disease.   There are collateral branches noted to the distal RCA from the circumflex  and LAD.   The proximal RCA is a medium to large sized vessel which is totally occluded  in its early mid portion.  As previously stated there is left to right  collateral flow into the distal RCA PAD and posterolateral branch.   Left ventriculogram reveals a preserved EF at 60%.  HEMODYNAMICS:  Systemic arterial pressure 116/68, LV systemic pressure  118/11, LVEDP of 19.   INTERVENTIONAL PROCEDURE:  RCA-mid-proximal.  Following diagnostic  angiography a #6-French JR4 guiding catheter with side holes was then  engaged in the right coronary ostium.  Next, a 0.014 Prowater guidewire was  advanced out a guiding catheter and then used to cross the mid RCA total  occlusion and positioned in the PDA without difficulty.  Next, a Voyager 2.5  x 15 mm balloon was then used to cross the mid total occlusion and six  subsequent inflations to a maximum of 16 atmospheres for a total of three  and a half minutes and then performed throughout the mid and proximal RCA  with restoration of TIMI 3 flow in the vessel.  This balloon was then  performed and a CYPHER 3 x 33 mm stent was then positioned in the mid LAD  stenotic lesion.  This stent was then deployed to 12 atmospheres for 30  seconds.  A second inflation to 16 atmospheres was performed for a total of  18 seconds.   Follow-up angiogram revealed no evidence of thrombus with TIMI  3 flow to the distal vessel, though the stent appeared to be somewhat under  sized.  The stent balloon was removed and a PowerSail 3.5 x 18 mm stent was  then inserted in the mid portion of the RCA and three subsequent inflations  to 16 atmospheres was performed throughout the proximal and mid RCA stenotic  lesions.  Follow-up angiogram revealed an excellent luminal gain.  The  ostial portion of the RCA appeared somewhat hazy and we opted to place a  second CYPHER 3.5 x 8 mm stent at the ostial portion of the RCA.  The stent  was deployed at 12 for a total of 25 seconds.  A second inflation to 14  atmospheres was performed to 24 seconds.  Follow-up angiogram revealed no  evidence of dissection or thrombus with TIMI 3 flow to the distal vessel.  There was noted to be a 60-70% lesion in the mid PLA which appeared to be a  less than 2 vessel and we elected to treat this medically.  IV Integrilin  was used throughout the case.  Intravenous doses of heparin were given to  maintain the ACT between 200-300.   Final orthogonal angiograms revealed less than 10% residual stenosis in the  proximal and mid RCA stenotic lesions with TIMI 3 flow to the distal vessel.  At this point we elected to conclude the procedure.  All balloons and  catheters were removed.  Hemostatic sheath was sewn in place and patient was  transferred back to the ward in stable condition.   CONCLUSION:  1.  Successful percutaneous transluminal coronary balloon angioplasty and      placement of a CYPHER 3 x 33 in the mid right coronary artery stenotic      lesion ultimately post dilated to 3.67 mm.  2.  Successful placement of a CYPHER 3.5 x 8 mm stent in the ostial portion      of the right coronary artery.  3.  70% residual proximal left anterior descending disease with 80% ostial     diagonal disease.  4.  70% mid obtuse marginal stenotic lesion.  5.  Normal  left ventricular function.  6.  Adjunct use of Integrilin infusion.      Darlin Priestly, MD  Electronically Signed     RHM/MEDQ  D:  06/15/2005  T:  06/15/2005  Job:  [id number]   cc:   Patrica Duel, M.D.  Fax: [phone]

## 2010-09-16 NOTE — Discharge Summary (Signed)
NAME:  ESTLE, HUGULEY NO.:  [id number]   MEDICAL RECORD NO.:  [id number]          PATIENT TYPE:  INP   LOCATION:  6531                         FACILITY:  [facility]   PHYSICIAN:  Abelino Derrick, P.A.   DATE OF BIRTH:  06-Mar-1946   DATE OF ADMISSION:  06/14/2005  DATE OF DISCHARGE:  06/17/2005                                 DISCHARGE SUMMARY   DISCHARGE DIAGNOSES:  1.  Coronary disease, subendocardial myocardial infarction this admission.  2.  Right coronary artery stenting this admission.  3.  Residual 70% obtuse marginal and 70% left anterior descending; plan is      medical therapy.  4.  Good left ventricular function.  5.  Treated hypertension.  6.  Dyslipidemia.  7.  Anxiety.   HOSPITAL COURSE:  The patient is a 65 year old male followed by Dr. Allyson Sabal  and Dr. Nobie Putnam who presented to [facility] ER with chest pain 06/12/05.  The patient is on Prilosec at home.  He was seen by Dr. Allyson Sabal consult.  He  has had remote peptic ulcer disease but no other significant coronary  disease.  He was put on heparin and nitrates, Integrilin, aspirin and beta  blocker and transferred to [facility].  The initial troponins were elevated.  He  was set up for diagnostic catheterization which was done June 15, 2005,  by Dr. Jenne Campus.  The patient had a total RCA with left-to-right collaterals.  There is also residual disease as noted above.  He underwent PTCA and  stenting to the RCA.  He received two stents, one in the mid RCA and one  ostial RCA.  He tolerated the procedure well.  His CKs peaked at 308 with 38  MBs and troponin of 3.50.  Other labs:  White count 7.2, hemoglobin 13.2,  hematocrit 37.3, platelets 169,000.  Sodium 138, potassium 3.8, BUN 5,  creatinine 0.9.  Cholesterol was 71 with HDL of 42, LDL of 96.  TSH 3.038.  Chest x-ray no acute disease.  EKG shows sinus rhythm with inferior Qs.   DISPOSITION:  The patient is discharged in stable condition.  He will follow  up  with Dr. Allyson Sabal in a couple of weeks.   DISCHARGE MEDICATIONS:  1.  Prilosec OTC once a day.  2.  Coated aspirin every day.  3.  Plavix 75 mg a day.  4.  Metoprolol 50 mg b.i.d.  5.  Isosorbide mononitrate 30 mg a day.  6.  Simvastatin 40 mg a day.  7.  Lisinopril 10 mg a day.  8.  Nitroglycerin sublingual p.r.n.      Abelino Derrick, P.Memory Dance  D:  06/17/2005  T:  06/17/2005  Job:  [id number]   cc:   Patrica Duel, M.D.  Fax: [phone]

## 2011-01-31 LAB — COMPREHENSIVE METABOLIC PANEL
ALT: 20
AST: 17
Albumin: 3.6
CO2: 26
Calcium: 9.3
GFR calc Af Amer: >60
Sodium: 136
Total Protein: 6.3

## 2011-01-31 LAB — DIFFERENTIAL
Basophils Absolute: 0
Eosinophils Absolute: 0
Eosinophils Relative: 0
Lymphocytes Relative: 8 — ABNORMAL LOW
Lymphs Abs: 0.8
Lymphs Abs: 1.4
Monocytes Absolute: 0.9
Monocytes Absolute: 1.1 — ABNORMAL HIGH
Monocytes Relative: 10
Monocytes Relative: 9
Neutro Abs: 8.7 — ABNORMAL HIGH

## 2011-01-31 LAB — BASIC METABOLIC PANEL
BUN: 4 — ABNORMAL LOW
CO2: 29
Chloride: 104
Glucose, Bld: 149 — ABNORMAL HIGH
Potassium: 4

## 2011-01-31 LAB — URINALYSIS, ROUTINE W REFLEX MICROSCOPIC
Bilirubin Urine: NEGATIVE
Nitrite: NEGATIVE
Specific Gravity, Urine: <1.005 — ABNORMAL LOW
Urobilinogen, UA: 0.2

## 2011-01-31 LAB — CBC
HCT: 39.9
Hemoglobin: 13.9
MCHC: 34.6
RBC: 4.85
RDW: 13
RDW: 13

## 2011-02-15 LAB — POCT CARDIAC MARKERS
Myoglobin, poc: 63.7
Operator id: 282751

## 2011-02-15 LAB — CBC
HCT: 41.3
HCT: 42.1
HCT: 42.8
Hemoglobin: 14.1
Hemoglobin: 14.6
MCHC: 33.8
MCHC: 34.2
MCHC: 34.6
MCV: 88
Platelets: 193
RBC: 4.7
RBC: 4.83
RDW: 13.1
RDW: 13.3

## 2011-02-15 LAB — COMPREHENSIVE METABOLIC PANEL
ALT: 27
CO2: 23
Calcium: 8.6
Creatinine, Ser: 0.79
GFR calc non Af Amer: >60
Glucose, Bld: 100 — ABNORMAL HIGH
Sodium: 134 — ABNORMAL LOW

## 2011-02-15 LAB — LIPID PANEL
Cholesterol: 113
LDL Cholesterol: 59
VLDL: 27

## 2011-02-15 LAB — PROTIME-INR: INR: 1.1

## 2011-02-15 LAB — I-STAT 8, (EC8 V) (CONVERTED LAB)
BUN: 14
Chloride: 105
Glucose, Bld: 97
Potassium: 4
TCO2: 22
pH, Ven: 7.407 — ABNORMAL HIGH

## 2011-02-15 LAB — URINALYSIS, ROUTINE W REFLEX MICROSCOPIC
Leukocytes, UA: NEGATIVE
Nitrite: NEGATIVE
Specific Gravity, Urine: 1.02
pH: 7

## 2011-02-15 LAB — TROPONIN I: Troponin I: 0.01

## 2011-02-15 LAB — BASIC METABOLIC PANEL
BUN: 10
CO2: 27
GFR calc non Af Amer: >60
Glucose, Bld: 128 — ABNORMAL HIGH
Potassium: 4.2

## 2011-02-15 LAB — DIFFERENTIAL
Eosinophils Absolute: 0.1
Lymphs Abs: 1.7
Neutrophils Relative %: 70

## 2011-02-15 LAB — APTT: aPTT: 28

## 2011-02-15 LAB — URINE MICROSCOPIC-ADD ON

## 2011-02-15 LAB — POCT I-STAT CREATININE: Operator id: 189501

## 2012-10-08 ENCOUNTER — Other Ambulatory Visit: Payer: Self-pay

## 2012-10-08 MED ORDER — PRAVASTATIN SODIUM 40 MG PO TABS: 40.0000 mg | ORAL_TABLET | Freq: Every day | ORAL | Status: DC

## 2012-10-08 NOTE — Telephone Encounter (Signed)
Rx was sent to pharmacy electronically. 

## 2012-10-23 ENCOUNTER — Other Ambulatory Visit (HOSPITAL_COMMUNITY): Payer: Self-pay | Admitting: Family Medicine

## 2012-10-23 ENCOUNTER — Ambulatory Visit (HOSPITAL_COMMUNITY)
Admission: RE | Admit: 2012-10-23 | Discharge: 2012-10-23 | Disposition: A | Discharge status: Home / Self Care | Payer: Medicare Other | Source: Ambulatory Visit | Attending: Family Medicine | Admitting: Family Medicine

## 2012-10-23 DIAGNOSIS — E279 Disorder of adrenal gland, unspecified: Secondary | ICD-10-CM | POA: Insufficient documentation

## 2012-10-23 DIAGNOSIS — R1032 Left lower quadrant pain: Secondary | ICD-10-CM

## 2012-10-23 DIAGNOSIS — R109 Unspecified abdominal pain: Secondary | ICD-10-CM

## 2012-10-23 DIAGNOSIS — R110 Nausea: Secondary | ICD-10-CM | POA: Insufficient documentation

## 2012-10-23 DIAGNOSIS — R197 Diarrhea, unspecified: Secondary | ICD-10-CM | POA: Insufficient documentation

## 2012-10-23 DIAGNOSIS — K5730 Diverticulosis of large intestine without perforation or abscess without bleeding: Secondary | ICD-10-CM | POA: Insufficient documentation

## 2012-10-23 MED ORDER — IOHEXOL 300 MG/ML  SOLN
50.0000 mL | Freq: Once | INTRAMUSCULAR | Status: AC | PRN
  Administered 2012-10-23: 50 mL via ORAL

## 2012-10-23 MED ORDER — IOHEXOL 300 MG/ML  SOLN
100.0000 mL | Freq: Once | INTRAMUSCULAR | Status: AC | PRN
  Administered 2012-10-23: 100 mL via INTRAVENOUS

## 2012-10-29 DIAGNOSIS — Z86718 Personal history of other venous thrombosis and embolism: Secondary | ICD-10-CM

## 2012-10-29 DIAGNOSIS — Z86711 Personal history of pulmonary embolism: Secondary | ICD-10-CM

## 2012-10-29 HISTORY — DX: Personal history of pulmonary embolism: Z86.711

## 2012-10-29 HISTORY — DX: Personal history of other venous thrombosis and embolism: Z86.718

## 2012-11-07 ENCOUNTER — Other Ambulatory Visit (HOSPITAL_COMMUNITY): Payer: Self-pay | Admitting: General Surgery

## 2012-11-07 DIAGNOSIS — M7989 Other specified soft tissue disorders: Secondary | ICD-10-CM

## 2012-11-07 DIAGNOSIS — M79604 Pain in right leg: Secondary | ICD-10-CM

## 2012-11-08 ENCOUNTER — Emergency Department (HOSPITAL_COMMUNITY): Payer: Medicare Other

## 2012-11-08 ENCOUNTER — Encounter (HOSPITAL_COMMUNITY): Payer: Self-pay | Admitting: *Deleted

## 2012-11-08 ENCOUNTER — Observation Stay (HOSPITAL_COMMUNITY)
Admission: EM | Admit: 2012-11-08 | Discharge: 2012-11-09 | Disposition: A | Discharge status: Home / Self Care | Payer: Medicare Other | Attending: Family Medicine | Admitting: Family Medicine

## 2012-11-08 ENCOUNTER — Ambulatory Visit (HOSPITAL_COMMUNITY)
Admission: RE | Admit: 2012-11-08 | Discharge: 2012-11-08 | Disposition: A | Discharge status: Home / Self Care | Payer: Medicare Other | Source: Ambulatory Visit | Attending: General Surgery | Admitting: General Surgery

## 2012-11-08 DIAGNOSIS — I82402 Acute embolism and thrombosis of unspecified deep veins of left lower extremity: Secondary | ICD-10-CM

## 2012-11-08 DIAGNOSIS — Z86711 Personal history of pulmonary embolism: Secondary | ICD-10-CM | POA: Diagnosis present

## 2012-11-08 DIAGNOSIS — I2699 Other pulmonary embolism without acute cor pulmonale: Principal | ICD-10-CM | POA: Insufficient documentation

## 2012-11-08 DIAGNOSIS — I82409 Acute embolism and thrombosis of unspecified deep veins of unspecified lower extremity: Secondary | ICD-10-CM

## 2012-11-08 DIAGNOSIS — Z86718 Personal history of other venous thrombosis and embolism: Secondary | ICD-10-CM | POA: Diagnosis present

## 2012-11-08 DIAGNOSIS — M79604 Pain in right leg: Secondary | ICD-10-CM

## 2012-11-08 DIAGNOSIS — R0602 Shortness of breath: Secondary | ICD-10-CM | POA: Insufficient documentation

## 2012-11-08 DIAGNOSIS — Z7901 Long term (current) use of anticoagulants: Secondary | ICD-10-CM | POA: Insufficient documentation

## 2012-11-08 DIAGNOSIS — K219 Gastro-esophageal reflux disease without esophagitis: Secondary | ICD-10-CM | POA: Diagnosis present

## 2012-11-08 DIAGNOSIS — E669 Obesity, unspecified: Secondary | ICD-10-CM | POA: Insufficient documentation

## 2012-11-08 DIAGNOSIS — Z9861 Coronary angioplasty status: Secondary | ICD-10-CM | POA: Insufficient documentation

## 2012-11-08 DIAGNOSIS — I824Y9 Acute embolism and thrombosis of unspecified deep veins of unspecified proximal lower extremity: Secondary | ICD-10-CM | POA: Insufficient documentation

## 2012-11-08 DIAGNOSIS — M7989 Other specified soft tissue disorders: Secondary | ICD-10-CM

## 2012-11-08 DIAGNOSIS — Z6833 Body mass index (BMI) 33.0-33.9, adult: Secondary | ICD-10-CM | POA: Insufficient documentation

## 2012-11-08 DIAGNOSIS — I2510 Atherosclerotic heart disease of native coronary artery without angina pectoris: Secondary | ICD-10-CM | POA: Insufficient documentation

## 2012-11-08 LAB — POCT I-STAT, CHEM 8
BUN: 4 mg/dL — ABNORMAL LOW (ref 6–23)
Calcium, Ion: 1.22 mmol/L (ref 1.13–1.30)
Chloride: 104 mEq/L (ref 96–112)
Creatinine, Ser: 0.7 mg/dL (ref 0.50–1.35)
Sodium: 141 mEq/L (ref 135–145)
TCO2: 27 mmol/L (ref 0–100)

## 2012-11-08 LAB — URINALYSIS, ROUTINE W REFLEX MICROSCOPIC
Glucose, UA: NEGATIVE mg/dL
Hgb urine dipstick: NEGATIVE
Specific Gravity, Urine: 1.01 (ref 1.005–1.030)
Urobilinogen, UA: 0.2 mg/dL (ref 0.0–1.0)

## 2012-11-08 LAB — CBC WITH DIFFERENTIAL/PLATELET
Basophils Absolute: 0 10*3/uL (ref 0.0–0.1)
Basophils Relative: 1 % (ref 0–1)
Eosinophils Relative: 4 % (ref 0–5)
HCT: 41.6 % (ref 39.0–52.0)
MCH: 31.2 pg (ref 26.0–34.0)
MCHC: 35.6 g/dL (ref 30.0–36.0)
MCV: 87.8 fL (ref 78.0–100.0)
Monocytes Absolute: 0.6 10*3/uL (ref 0.1–1.0)
RDW: 13.6 % (ref 11.5–15.5)

## 2012-11-08 MED ORDER — FLEET ENEMA 7-19 GM/118ML RE ENEM: 1.0000 | ENEMA | Freq: Once | RECTAL | Status: AC | PRN

## 2012-11-08 MED ORDER — ASPIRIN EC 81 MG PO TBEC
81.0000 mg | DELAYED_RELEASE_TABLET | Freq: Every day | ORAL | Status: DC
  Administered 2012-11-08 – 2012-11-09 (×2): 81 mg via ORAL
  Filled 2012-11-08 (×2): qty 1

## 2012-11-08 MED ORDER — IOHEXOL 350 MG/ML SOLN: 80.0000 mL | Freq: Once | INTRAVENOUS | Status: DC | PRN

## 2012-11-08 MED ORDER — NIACIN ER (ANTIHYPERLIPIDEMIC) 500 MG PO TBCR
1000.0000 mg | EXTENDED_RELEASE_TABLET | Freq: Every day | ORAL | Status: DC
  Administered 2012-11-08: 1000 mg via ORAL
  Filled 2012-11-08 (×3): qty 2

## 2012-11-08 MED ORDER — ENOXAPARIN SODIUM 120 MG/0.8ML ~~LOC~~ SOLN
1.0000 mg/kg | Freq: Two times a day (BID) | SUBCUTANEOUS | Status: DC
  Administered 2012-11-09: 105 mg via SUBCUTANEOUS
  Filled 2012-11-08 (×5): qty 0.8

## 2012-11-08 MED ORDER — ACETAMINOPHEN 325 MG PO TABS: 650.0000 mg | ORAL_TABLET | ORAL | Status: DC | PRN

## 2012-11-08 MED ORDER — SODIUM CHLORIDE 0.9 % IV SOLN
INTRAVENOUS | Status: DC
  Administered 2012-11-08 (×2): via INTRAVENOUS

## 2012-11-08 MED ORDER — ONDANSETRON HCL 4 MG/2ML IJ SOLN: 4.0000 mg | INTRAMUSCULAR | Status: DC | PRN

## 2012-11-08 MED ORDER — NIACIN 500 MG PO TABS
ORAL_TABLET | ORAL | Status: AC
  Filled 2012-11-08: qty 2

## 2012-11-08 MED ORDER — WARFARIN SODIUM 10 MG PO TABS
ORAL_TABLET | ORAL | Status: AC
  Filled 2012-11-08: qty 1

## 2012-11-08 MED ORDER — SIMVASTATIN 20 MG PO TABS: 20.0000 mg | ORAL_TABLET | Freq: Every day | ORAL | Status: DC

## 2012-11-08 MED ORDER — NEBIVOLOL HCL 2.5 MG PO TABS
ORAL_TABLET | ORAL | Status: AC
  Filled 2012-11-08: qty 4

## 2012-11-08 MED ORDER — DIAZEPAM 5 MG PO TABS: 10.0000 mg | ORAL_TABLET | Freq: Every evening | ORAL | Status: DC | PRN

## 2012-11-08 MED ORDER — WARFARIN SODIUM 10 MG PO TABS
10.0000 mg | ORAL_TABLET | Freq: Every day | ORAL | Status: DC
  Administered 2012-11-08: 10 mg via ORAL
  Filled 2012-11-08 (×2): qty 1

## 2012-11-08 MED ORDER — OMEGA-3 FATTY ACIDS 1000 MG PO CAPS: 3.0000 g | ORAL_CAPSULE | Freq: Every day | ORAL | Status: DC

## 2012-11-08 MED ORDER — AMLODIPINE BESYLATE 5 MG PO TABS
5.0000 mg | ORAL_TABLET | Freq: Every day | ORAL | Status: DC
  Administered 2012-11-08 – 2012-11-09 (×2): 5 mg via ORAL
  Filled 2012-11-08 (×2): qty 1

## 2012-11-08 MED ORDER — PANTOPRAZOLE SODIUM 40 MG PO TBEC
40.0000 mg | DELAYED_RELEASE_TABLET | Freq: Every day | ORAL | Status: DC
  Administered 2012-11-08 – 2012-11-09 (×2): 40 mg via ORAL
  Filled 2012-11-08 (×2): qty 1

## 2012-11-08 MED ORDER — WARFARIN - PHYSICIAN DOSING INPATIENT: Freq: Every day | Status: DC

## 2012-11-08 MED ORDER — SODIUM CHLORIDE 0.9 % IJ SOLN: 3.0000 mL | Freq: Two times a day (BID) | INTRAMUSCULAR | Status: DC

## 2012-11-08 MED ORDER — NEBIVOLOL HCL 10 MG PO TABS
10.0000 mg | ORAL_TABLET | Freq: Two times a day (BID) | ORAL | Status: DC
  Administered 2012-11-08 – 2012-11-09 (×2): 10 mg via ORAL
  Filled 2012-11-08 (×6): qty 1

## 2012-11-08 MED ORDER — TRAZODONE HCL 50 MG PO TABS
50.0000 mg | ORAL_TABLET | Freq: Every evening | ORAL | Status: DC | PRN
  Administered 2012-11-08: 50 mg via ORAL
  Filled 2012-11-08: qty 1

## 2012-11-08 MED ORDER — DOCUSATE SODIUM 100 MG PO CAPS
100.0000 mg | ORAL_CAPSULE | Freq: Two times a day (BID) | ORAL | Status: DC
  Administered 2012-11-08 – 2012-11-09 (×2): 100 mg via ORAL
  Filled 2012-11-08 (×2): qty 1

## 2012-11-08 MED ORDER — WARFARIN VIDEO
Freq: Once | Status: AC
  Administered 2012-11-09: 10:00:00

## 2012-11-08 MED ORDER — POTASSIUM CHLORIDE IN NACL 20-0.9 MEQ/L-% IV SOLN
INTRAVENOUS | Status: DC
  Administered 2012-11-08: 23:00:00 via INTRAVENOUS

## 2012-11-08 MED ORDER — IOHEXOL 300 MG/ML  SOLN
100.0000 mL | Freq: Once | INTRAMUSCULAR | Status: AC | PRN
  Administered 2012-11-08: 100 mL via INTRAVENOUS

## 2012-11-08 MED ORDER — OMEGA-3-ACID ETHYL ESTERS 1 G PO CAPS
1.0000 g | ORAL_CAPSULE | Freq: Every day | ORAL | Status: DC
  Administered 2012-11-09: 1 g via ORAL
  Filled 2012-11-08: qty 1

## 2012-11-08 MED ORDER — LOSARTAN POTASSIUM 50 MG PO TABS
50.0000 mg | ORAL_TABLET | Freq: Every day | ORAL | Status: DC
  Administered 2012-11-08 – 2012-11-09 (×2): 50 mg via ORAL
  Filled 2012-11-08 (×2): qty 1

## 2012-11-08 MED ORDER — ENOXAPARIN SODIUM 100 MG/ML ~~LOC~~ SOLN
100.0000 mg | Freq: Once | SUBCUTANEOUS | Status: AC
  Administered 2012-11-08: 100 mg via SUBCUTANEOUS
  Filled 2012-11-08: qty 1

## 2012-11-08 MED ORDER — CLOPIDOGREL BISULFATE 75 MG PO TABS
75.0000 mg | ORAL_TABLET | Freq: Every day | ORAL | Status: DC
  Administered 2012-11-08 – 2012-11-09 (×2): 75 mg via ORAL
  Filled 2012-11-08 (×2): qty 1

## 2012-11-08 MED ORDER — PATIENT'S GUIDE TO USING COUMADIN BOOK
Freq: Once | Status: AC
  Administered 2012-11-09: 10:00:00
  Filled 2012-11-08: qty 1

## 2012-11-08 NOTE — ED Notes (Signed)
Pt requesting a meal tray

## 2012-11-08 NOTE — H&P (Signed)
[facility] History and Physical  Steven Oneal  ZOX:[id number]  DOB: 05/13/1945   DOA: 11/08/2012   PCP:   [name], MD   Chief Complaint:  Swelling of the left leg and shortness of breath for 3 weeks  HPI: Steven Oneal is a 67 y.o. male.   Elderly overweight Caucasian gentleman, with known history of diverticulosis,  who is usually very active walks at least 2 miles each day, has been sick for about a month. He reports that because of extreme fatigue he has stopped walking for about 3 weeks, is no longer able to more his lawn. Because of his abdominal pain and history of diverticulosis it seems his primary care physician referred him to Dr. Leticia Penna for further management. Dr. Leticia Penna noted the swollen left leg and referred him to the emergency room for ultrasound evaluation. Ultrasound revealed evidence of DVT and a subsequent CT angiogram revealed pulmonary embolus.  He denies any bloody or black stool; he denies a history of lower GI bleed.  Patient denies any long-distance travel; he is not by any means a sedentary person; he is overweight; He does have a brother with a history of pulmonary embolus while being treated for lung cancer   Rewiew of Systems:   All systems negative except as marked bold or noted in the HPI;  Constitutional:    malaise, fever and chills. ;  Eyes:   eye pain, redness and discharge. ;  ENMT:   ear pain, hoarseness, nasal congestion, sinus pressure and sore throat. ;  Cardiovascular:    chest pain, palpitations, diaphoresis, dyspnea and peripheral edema.  Respiratory:   cough, hemoptysis, wheezing and stridor. ;  Gastrointestinal:  nausea, vomiting, diarrhea, constipation, abdominal pain, melena, blood in stool, hematemesis, jaundice and rectal bleeding. unusual weight loss..   Genitourinary:    frequency, dysuria, incontinence,flank pain and hematuria; Musculoskeletal:   back pain and neck pain.  swelling and trauma.;  Skin: .   pruritus, rash, abrasions, bruising and skin lesion.; ulcerations Neuro:    headache, lightheadedness and neck stiffness.  weakness, altered level of consciousness, altered mental status, extremity weakness, burning feet, involuntary movement, seizure and syncope.  Psych:    anxiety, depression, insomnia, tearfulness, panic attacks, hallucinations, paranoia, suicidal or homicidal ideation    Past Medical History  Diagnosis Date  . Allergic rhinitis   . Acid reflux   . CAD (coronary artery disease)     stents in 2007  . Hypertension   . Hyperlipidemia     Past Surgical History  Procedure Laterality Date  . Appendectomy    . Cardiac surgery      two stents placed through cardiac cath, 2007/2007  . Colonoscopy  11/2003    single anal papilla and int hemorrhoids, pancolonic tics    Medications:  HOME MEDS: Prior to Admission medications   Medication Sig Start Date End Date Taking? Authorizing Provider  amLODipine (NORVASC) 5 MG tablet Take 5 mg by mouth daily.     Yes Historical Provider, MD  aspirin 81 MG tablet Take 81 mg by mouth daily.     Yes Historical Provider, MD  clopidogrel (PLAVIX) 75 MG tablet Take 75 mg by mouth daily.     Yes Historical Provider, MD  diazepam (VALIUM) 10 MG tablet Take 10 mg by mouth every 6 (six) hours as needed.     Yes Historical Provider, MD  fish oil-omega-3 fatty acids 1000 MG capsule Take 3 g by mouth daily.  Yes Historical Provider, MD  losartan (COZAAR) 50 MG tablet Take 50 mg by mouth daily.     Yes Historical Provider, MD  Naproxen Sodium (ALEVE PO) Take 2 tablets by mouth daily as needed (pain).   Yes Historical Provider, MD  nebivolol (BYSTOLIC) 10 MG tablet Take 10 mg by mouth 2 (two) times daily.    Yes Historical Provider, MD  niacin (NIASPAN) 1000 MG CR tablet Take 1,000 mg by mouth at bedtime.     Yes Historical Provider, MD  omeprazole (PRILOSEC) 20 MG capsule Take 20 mg by mouth daily.     Yes Historical Provider, MD  pravastatin  (PRAVACHOL) 40 MG tablet Take 1 tablet (40 mg total) by mouth daily. 10/08/12  Yes Runell Gess, MD     Allergies:  Allergies  Allergen Reactions  . Codeine Itching, Nausea Only and Rash    Social History:   reports that he has never smoked. He has never used smokeless tobacco. He reports that he does not drink alcohol or use illicit drugs.  Family History: Family History  Problem Relation Age of Onset  . Colon cancer Mother 37  . Colon cancer Paternal Uncle 98  . Colon cancer Brother 13    rectal cancer  . Colon cancer Paternal Uncle 10  . Throat cancer Brother 86  . Prostate cancer Brother      Physical Exam: Filed Vitals:   11/08/12 1331 11/08/12 1756 11/08/12 1925 11/08/12 2051  BP: 123/73 119/65 144/77 129/74  Pulse: 85 85 86 84  Temp: 98 F (36.7 C) 97.5 F (36.4 C)  97.5 F (36.4 C)  TempSrc: Oral Oral  Oral  Resp: 18  24 20   Height: 5\' 9"  (1.753 m)     Weight: 103.42 kg (228 lb)     SpO2: 98% 94% 96% 99%   Blood pressure 129/74, pulse 84, temperature 97.5 F (36.4 C), temperature source Oral, resp. rate 20, height 5\' 9"  (1.753 m), weight 103.42 kg (228 lb), SpO2 99.00%. Body mass index is 33.65 kg/(m^2).   GEN:  Pleasant middle-aged Caucasian gentleman lying bed in no acute distress; cooperative with exam PSYCH:  alert and oriented x4;  anxious nor depressed; affect is appropriate. HEENT: Mucous membranes pink and anicteric; PERRLA; EOM intact;  Breasts:: Not examined CHEST WALL: No tenderness CHEST: Normal respiration, clear to auscultation bilaterally HEART: Regular rate and rhythm; no murmurs rubs or gallops BACK: No kyphosis no scoliosis; no CVA tenderness ABDOMEN: Obese, mild lower abdominal; no masses, no organomegaly, normal abdominal bowel sounds; no pannus; no intertriginous candida. Rectal Exam: Not done EXTREMITIES: age-appropriate arthropathy of the hands and knees; slightly swollen left leg ; no ulcerations. Genitalia: not  examined PULSES: 2+ and symmetric SKIN: Normal hydration no rash or ulceration CNS: Cranial nerves 2-12 grossly intact no focal lateralizing neurologic deficit   Labs on Admission:  Basic Metabolic Panel:  Recent Labs Lab 11/08/12 1613  NA 141  K 3.7  CL 104  GLUCOSE 100*  BUN 4*  CREATININE 0.70   Liver Function Tests: No results found for this basename: AST, ALT, ALKPHOS, BILITOT, PROT, ALBUMIN,  in the last 168 hours No results found for this basename: LIPASE, AMYLASE,  in the last 168 hours No results found for this basename: AMMONIA,  in the last 168 hours CBC:  Recent Labs Lab 11/08/12 1558 11/08/12 1613  WBC 6.8  --   NEUTROABS 3.8  --   HGB 14.8 14.3  HCT 41.6 42.0  MCV  87.8  --   PLT 214  --    Cardiac Enzymes: No results found for this basename: CKTOTAL, CKMB, CKMBINDEX, TROPONINI,  in the last 168 hours BNP: No components found with this basename: POCBNP,  D-dimer: No components found with this basename: D-DIMER,  CBG: No results found for this basename: GLUCAP,  in the last 168 hours  Radiological Exams on Admission: Ct Angio Chest W/cm &/or Wo Cm  11/08/2012   *RADIOLOGY REPORT*  Clinical Data: Shortness of breath, elevated D-dimer  CT ANGIOGRAPHY CHEST  Technique:  Multidetector CT imaging of the chest using the standard protocol during bolus administration of intravenous contrast. Multiplanar reconstructed images including MIPs were obtained and reviewed to evaluate the vascular anatomy.  Contrast: OMNIPAQUE IOHEXOL 300 MG/ML  SOLN  Comparison: None  Findings: Images of the thoracic inlet are unremarkable.  Mild atherosclerotic calcifications of the thoracic aorta.  No aortic aneurysm or dissection.  The study is of excellent technical quality.  Thrombus is noted in the left lower lobe pulmonary artery.  A small amount of thrombus is seen extending into at least two segmental arterial branches in  left lower lobe.  This is best seen in axial image  41.  Atherosclerotic calcifications of the coronary artery.  Heart size is within normal limits.  The visualized upper abdomen shows no adrenal gland mass.  Gallbladder is contracted.  Question tiny gallstones.  Images of the lung parenchyma shows no acute infiltrate or pulmonary edema.  No pulmonary nodules.  Sagittal images of the spine shows no destructive bony lesions.  Degenerative changes thoracic spine are noted.  IMPRESSION:  1.  Study is positive for pulmonary embolus with thrombus noted in the left lower lobe arterial branch please see axial image 41. 2.  No acute infiltrate or pulmonary edema.  No adenopathy. 3.  Contracted gallbladder.  Question small gallstones.  Critical findings discussed with Dr. Lynelle Doctor   Original Report Authenticated By: Natasha Mead, M.D.   US Venous Img Lower Bilateral  11/08/2012   *RADIOLOGY REPORT*  Clinical Data: Bilateral leg swelling  RIGHT LOWER EXTREMITY VENOUS DUPLEX ULTRASOUND  Technique:  Gray-scale sonography with graded compression, as well as color Doppler and duplex ultrasound were performed to evaluate the deep venous system of the lower extremity from the level of the common femoral vein through the popliteal and proximal calf veins. Spectral Doppler was utilized to evaluate flow at rest and with distal augmentation maneuvers.  Comparison:  None.  Findings:  Normal compressibility of the common femoral, superficial femoral, and popliteal veins is demonstrated, as well as the visualized proximal calf veins.  No filling defects to suggest DVT on grayscale or color Doppler imaging.  Doppler waveforms show normal direction of venous flow, normal respiratory phasicity and response to augmentation.  IMPRESSION: No evidence of lower extremity deep vein thrombosis.  *RADIOLOGY REPORT*  Clinical Data: Bilateral leg swelling  LEFT LOWER EXTREMITY VENOUS DUPLEX ULTRASOUND  Technique:  Gray-scale sonography with graded compression, as well as color Doppler and duplex  ultrasound, were performed to evaluate the deep venous system of the lower extremity from the level of the common femoral vein through the popliteal and proximal calf veins. Spectral Doppler was utilized to evaluate flow at rest and with distal augmentation maneuvers.  Comparison:  None.  Findings: The left common femoral vein and superficial femoral vein are patent with normal compressibility and normal Doppler signal.  There is clot in the popliteal vein.  This is partially occlusive proximally  and occlusive distally.  IMPRESSION: Segmental DVT in the left popliteal vein which is occlusive in the distal popliteal vein.   Original Report Authenticated By: Janeece Riggers, M.D.     Assessment/Plan  Active Problems:   GERD (gastroesophageal reflux disease)   DVT (deep venous thrombosis)   Pulmonary embolus   Mild obesity   PLAN: Continue Lovenox and Coumadin already started in the emergency room; Discuss plan of management Continue management of his chronic medical conditions  Other plans as per orders.  Code Status: Full code    Naphtali Zywicki Nocturnist [facility] Pager [phone]   11/08/2012, 9:17 PM

## 2012-11-08 NOTE — ED Provider Notes (Signed)
History    This chart was scribed for Ward Givens, MD, MD by Ashley Jacobs, ED Scribe. The patient was seen in room APA16A/APA16A and the patient's care was started at 1523.  CSN: [id number] Arrival date & time 11/08/12  1314  First MD Initiated Contact with Patient 11/08/12 1446     Chief Complaint  Patient presents with  . DVT    Patient is a 67 y.o. male presenting with general illness. The history is provided by the patient and medical records. No language interpreter was used.  Illness Location:  Lower left leg Severity:  Mild Onset quality:  Gradual Chronicity:  Chronic Associated symptoms: cough and shortness of breath   Associated symptoms: no fatigue    HPI Comments: Steven Oneal is a 67 y.o. male with DVTwho presents to the Emergency Department complaining of a pop behind knee 3 weeks ago followed by constant moderate left leg pain behind his left knee and on the anterior portion of his left ankle and swelling of his left leg. Pt experiences associated SOB upon walking and nonproductive cough.  He denies any chest pain. He denies any fever. He states he was recently treated about 2 weeks ago for diverticulitis when he had abdominal pain and that improved after he took the antibiotics. However he was subsequently found to also have gallstones. He was seen today by his surgeon Dr. Leticia Penna who sent him for an outpatient Doppler ultrasound of his leg which showed he did have a DVT of his popliteal vein.   Pt has a hx of 2 hearts stents  in  Feb 2007. Pt reports that he has 3 small stones in gallbladder  Pt reports that he takes 1 Asprin (81mg ) at night. Pt reports that nothing relieves or worsens the pain.    Patient states he had a brother who had colon cancer and developed a PE. He also had another brother who died of throat cancer. He states his mother died of colon cancer at age 75  PCP Dr Regino Schultze Cardiologist Dr Allyson Sabal  Past Medical History  Diagnosis Date  .  Allergic rhinitis   . Acid reflux   . CAD (coronary artery disease)     stents in 2007  . Hypertension   . Hyperlipidemia    Past Surgical History  Procedure Laterality Date  . Appendectomy    . Cardiac surgery      two stents placed through cardiac cath, 2007/2007  . Colonoscopy  11/2003    single anal papilla and int hemorrhoids, pancolonic tics   Family History  Problem Relation Age of Onset  . Colon cancer Mother 87  . Colon cancer Paternal Uncle 6  . Colon cancer Brother 68    rectal cancer  . Colon cancer Paternal Uncle 55  . Throat cancer Brother 53  . Prostate cancer Brother    History  Substance Use Topics  . Smoking status: Never Smoker   . Smokeless tobacco: Never Used  . Alcohol Use: No   Pt is retired.  Pt do not have a hx of blood clotting issues.   Review of Systems  Constitutional: Negative for fatigue.  Respiratory: Positive for cough and shortness of breath.   Cardiovascular: Positive for leg swelling (lower left leg).  Musculoskeletal:       Left knee tenderness   Hematological: Does not bruise/bleed easily.  All other systems reviewed and are negative.    Allergies  Codeine  Home Medications   Current  Outpatient Rx  Name  Route  Sig  Dispense  Refill  . amLODipine (NORVASC) 5 MG tablet   Oral   Take 5 mg by mouth daily.           Marland Kitchen aspirin 81 MG tablet   Oral   Take 81 mg by mouth daily.           . clopidogrel (PLAVIX) 75 MG tablet   Oral   Take 75 mg by mouth daily.           . diazepam (VALIUM) 10 MG tablet   Oral   Take 10 mg by mouth every 6 (six) hours as needed.           . fish oil-omega-3 fatty acids 1000 MG capsule   Oral   Take 3 g by mouth daily.           Marland Kitchen losartan (COZAAR) 50 MG tablet   Oral   Take 50 mg by mouth daily.           . nebivolol (BYSTOLIC) 10 MG tablet   Oral   Take 10 mg by mouth daily.           . niacin (NIASPAN) 1000 MG CR tablet   Oral   Take 1,000 mg by mouth at  bedtime.           Marland Kitchen omeprazole (PRILOSEC) 20 MG capsule   Oral   Take 20 mg by mouth daily.           . pravastatin (PRAVACHOL) 40 MG tablet   Oral   Take 1 tablet (40 mg total) by mouth daily.   30 tablet   9    BP 123/73  Pulse 85  Temp(Src) 98 F (36.7 C) (Oral)  Resp 18  Ht 5\' 9"  (1.753 m)  Wt 228 lb (103.42 kg)  BMI 33.65 kg/m2  SpO2 98%  Vital signs normal    Physical Exam  Nursing note and vitals reviewed. Constitutional: He is oriented to person, place, and time. He appears well-developed and well-nourished.  Non-toxic appearance. He does not appear ill. No distress.  HENT:  Head: Normocephalic and atraumatic.  Right Ear: External ear normal.  Left Ear: External ear normal.  Nose: Nose normal. No mucosal edema or rhinorrhea.  Mouth/Throat: Oropharynx is clear and moist and mucous membranes are normal. No dental abscesses or edematous.  Eyes: Conjunctivae and EOM are normal. Pupils are equal, round, and reactive to light.  Neck: Normal range of motion and full passive range of motion without pain. Neck supple.  Cardiovascular: Normal rate, regular rhythm and normal heart sounds.  Exam reveals no gallop and no friction rub.   No murmur heard. Pulmonary/Chest: Effort normal and breath sounds normal. No respiratory distress. He has no wheezes. He has no rhonchi. He has no rales. He exhibits no tenderness and no crepitus.  Abdominal: Soft. Normal appearance and bowel sounds are normal. He exhibits no distension. There is no tenderness. There is no rebound and no guarding.  Musculoskeletal: Normal range of motion. He exhibits edema and tenderness.  Diffused swelling of the left lower leg Tenderness in the popliteal area of the L knee and the ant L ankle  Neurological: He is alert and oriented to person, place, and time. He has normal strength. No cranial nerve deficit.  Skin: Skin is warm, dry and intact. No rash noted. No erythema. No pallor.  Psychiatric: He has  a normal mood and affect.  His speech is normal and behavior is normal. His mood appears not anxious.    ED Course  Procedures (including critical care time)  Medications  0.9 %  sodium chloride infusion ( Intravenous New Bag/Given 11/08/12 1607)  iohexol (OMNIPAQUE) 350 MG/ML injection 80 mL (not administered)  enoxaparin (LOVENOX) injection 100 mg (not administered)  warfarin (COUMADIN) tablet 10 mg (not administered)  iohexol (OMNIPAQUE) 300 MG/ML solution 100 mL (100 mLs Intravenous Contrast Given 11/08/12 1650)    DIAGNOSTIC STUDIES: Oxygen Saturation is 100% on room air, normal by my interpretation.    COORDINATION OF CARE: 1528 Discussed course of care with pt which includes xray and prescription treament . Pt understands and agrees.  16:32 Dr Sherwood Gambler, if chest CT negative, stop lovenox, coumadin 10 mg see Monday July 14  17:09 Radiologist called results of CT angio chest.  18:00 Pt given results of his CT angio chest.   Pt given option of being admitted to start medication or to go home with medication and he prefers to be admitted overnight   18:49 Dr Irene Limbo, hospitalist, admit to tele, obs, team 1  Results for orders placed during the hospital encounter of 11/08/12  CBC WITH DIFFERENTIAL      Result Value Range   WBC 6.8  4.0 - 10.5 K/uL   RBC 4.74  4.22 - 5.81 MIL/uL   Hemoglobin 14.8  13.0 - 17.0 g/dL   HCT 16.1  09.6 - 04.5 %   MCV 87.8  78.0 - 100.0 fL   MCH 31.2  26.0 - 34.0 pg   MCHC 35.6  30.0 - 36.0 g/dL   RDW 40.9  81.1 - 91.4 %   Platelets 214  150 - 400 K/uL   Neutrophils Relative % 55  43 - 77 %   Neutro Abs 3.8  1.7 - 7.7 K/uL   Lymphocytes Relative 31  12 - 46 %   Lymphs Abs 2.1  0.7 - 4.0 K/uL   Monocytes Relative 9  3 - 12 %   Monocytes Absolute 0.6  0.1 - 1.0 K/uL   Eosinophils Relative 4  0 - 5 %   Eosinophils Absolute 0.3  0.0 - 0.7 K/uL   Basophils Relative 1  0 - 1 %   Basophils Absolute 0.0  0.0 - 0.1 K/uL  APTT      Result Value Range    aPTT 28  24 - 37 seconds  PROTIME-INR      Result Value Range   Prothrombin Time 13.3  11.6 - 15.2 seconds   INR 1.03  0.00 - 1.49  POCT I-STAT, CHEM 8      Result Value Range   Sodium 141  135 - 145 mEq/L   Potassium 3.7  3.5 - 5.1 mEq/L   Chloride 104  96 - 112 mEq/L   BUN 4 (*) 6 - 23 mg/dL   Creatinine, Ser 7.82  0.50 - 1.35 mg/dL   Glucose, Bld 956 (*) 70 - 99 mg/dL   Calcium, Ion 2.13  0.86 - 1.30 mmol/L   TCO2 27  0 - 100 mmol/L   Hemoglobin 14.3  13.0 - 17.0 g/dL   HCT 57.8  46.9 - 62.9 %   Laboratory interpretation all normal   Doppler US of Lower Extremities 11/08/2012   *RADIOLOGY REPORT*  Clinical Data: Bilateral leg swelling  RIGHT LOWER EXTREMITY VENOUS DUPLEX ULTRASOUND  Technique:  Gray-scale sonography with graded compression, as well as color Doppler and duplex ultrasound were performed  to evaluate the deep venous system of the lower extremity from the level of the common femoral vein through the popliteal and proximal calf veins. Spectral Doppler was utilized to evaluate flow at rest and with distal augmentation maneuvers.  Comparison:  None.  Findings:  Normal compressibility of the common femoral, superficial femoral, and popliteal veins is demonstrated, as well as the visualized proximal calf veins.  No filling defects to suggest DVT on grayscale or color Doppler imaging.  Doppler waveforms show normal direction of venous flow, normal respiratory phasicity and response to augmentation.  IMPRESSION: No evidence of lower extremity deep vein thrombosis.  *RADIOLOGY REPORT*  Clinical Data: Bilateral leg swelling  LEFT LOWER EXTREMITY VENOUS DUPLEX ULTRASOUND  Technique:  Gray-scale sonography with graded compression, as well as color Doppler and duplex ultrasound, were performed to evaluate the deep venous system of the lower extremity from the level of the common femoral vein through the popliteal and proximal calf veins. Spectral Doppler was utilized to evaluate flow at  rest and with distal augmentation maneuvers.  Comparison:  None.  Findings: The left common femoral vein and superficial femoral vein are patent with normal compressibility and normal Doppler signal.  There is clot in the popliteal vein.  This is partially occlusive proximally and occlusive distally.  IMPRESSION: Segmental DVT in the left popliteal vein which is occlusive in the distal popliteal vein.   Original Report Authenticated By: Janeece Riggers, M.D.     Ct Angio Chest W/cm &/or Wo Cm  11/08/2012   *RADIOLOGY REPORT*  Clinical Data: Shortness of breath, elevated D-dimer  CT ANGIOGRAPHY CHEST  Technique:  Multidetector CT imaging of the chest using the standard protocol during bolus administration of intravenous contrast. Multiplanar reconstructed images including MIPs were obtained and reviewed to evaluate the vascular anatomy.  Contrast: OMNIPAQUE IOHEXOL 300 MG/ML  SOLN  Comparison: None  Findings: Images of the thoracic inlet are unremarkable.  Mild atherosclerotic calcifications of the thoracic aorta.  No aortic aneurysm or dissection.  The study is of excellent technical quality.  Thrombus is noted in the left lower lobe pulmonary artery.  A small amount of thrombus is seen extending into at least two segmental arterial branches in  left lower lobe.  This is best seen in axial image 41.  Atherosclerotic calcifications of the coronary artery.  Heart size is within normal limits.  The visualized upper abdomen shows no adrenal gland mass.  Gallbladder is contracted.  Question tiny gallstones.  Images of the lung parenchyma shows no acute infiltrate or pulmonary edema.  No pulmonary nodules.  Sagittal images of the spine shows no destructive bony lesions.  Degenerative changes thoracic spine are noted.  IMPRESSION:  1.  Study is positive for pulmonary embolus with thrombus noted in the left lower lobe arterial branch please see axial image 41. 2.  No acute infiltrate or pulmonary edema.  No  adenopathy. 3.  Contracted gallbladder.  Question small gallstones.  Critical findings discussed with Dr. Lynelle Doctor   Original Report Authenticated By: Natasha Mead, M.D.     Ct Abdomen Pelvis W Contrast  10/23/2012  .  IMPRESSION: No acute findings.  Sigmoid colon diverticulosis with additional right colon diverticula, but no diverticulitis.  Stones along the gallbladder wall.  These may reside in the wall. There is no evidence of acute cholecystitis.  The stones are new from prior study.  Small right adrenal mass consistent with adenoma.  Stable renal cortical thinning.  Degenerative changes of the spine.  Original Report Authenticated By: Amie Portland, M.D.      1. DVT (deep venous thrombosis), left   2. Pulmonary embolus     Plan admission   Devoria Albe, MD, FACEP   MDM    I personally performed the services described in this documentation, which was scribed in my presence. The recorded information has been reviewed and considered.  Devoria Albe, MD, Armando Gang    Ward Givens, MD 11/08/12 Ebony Cargo

## 2012-11-08 NOTE — Progress Notes (Signed)
ANTICOAGULATION CONSULT NOTE - Initial Consult  Pharmacy Consult for Warfarin Indication: DVT / PE  Allergies  Allergen Reactions  . Codeine Itching, Nausea Only and Rash    Patient Measurements: Height: 5\' 9"  (175.3 cm) Weight: 228 lb (103.42 kg) IBW/kg (Calculated) : 70.7   Vital Signs: Temp: 97.5 F (36.4 C) (07/11 2051) Temp src: Oral (07/11 2051) BP: 129/74 mmHg (07/11 2051) Pulse Rate: 84 (07/11 2051)  Labs:  Recent Labs  11/08/12 1558 11/08/12 1613  HGB 14.8 14.3  HCT 41.6 42.0  PLT 214  --   APTT 28  --   LABPROT 13.3  --   INR 1.03  --   CREATININE  --  0.70    Estimated Creatinine Clearance: 106.2 ml/min (by C-G formula based on Cr of 0.7).   Medical History: Past Medical History  Diagnosis Date  . Allergic rhinitis   . Acid reflux   . CAD (coronary artery disease)     stents in 2007  . Hypertension   . Hyperlipidemia     Medications:  Scheduled:  . amLODipine  5 mg Oral Daily  . aspirin  81 mg Oral Daily  . clopidogrel  75 mg Oral Daily  . docusate sodium  100 mg Oral BID  . [START ON 11/09/2012] enoxaparin (LOVENOX) injection  1 mg/kg Subcutaneous Q12H  . fish oil-omega-3 fatty acids  3 g Oral Daily  . losartan  50 mg Oral Daily  . nebivolol  10 mg Oral BID  . niacin  1,000 mg Oral QHS  . pantoprazole  40 mg Oral Daily  . [START ON 11/09/2012] simvastatin  20 mg Oral q1800  . sodium chloride  3 mL Intravenous Q12H  . warfarin  10 mg Oral q1800  . [START ON 11/09/2012] Warfarin - Physician Dosing Inpatient   Does not apply q1800    Assessment: VTE treatment for DVT & PE Patient on Lovenox 1 mg/kg every 12 hours Warfarin 10 mg po given tonight  Goal of Therapy:  INR 2-3 Monitor platelets by anticoagulation protocol: Yes   Plan:  No additional Warfarin tonight INR/PT daily CBC daily Monitor renal function Labs per protocol  Raquel James, Tayvin Preslar Bennett 11/08/2012,9:57 PM

## 2012-11-08 NOTE — ED Notes (Signed)
Here for outpt US - DVT to left leg.  C/o increased swelling/pain x 3 wks.

## 2012-11-08 NOTE — ED Notes (Signed)
Pt c/o pain behind left knee and swelling to left lower leg x 3 weeks.  Denies injury.  Reports saw Dr. Caesar Bookman recently for diverticulitis and was told to come here for [place] of left leg.  Pt diagnosed with blood clot.  Left leg swollen, red.  Pedal pulse audible with doppler.

## 2012-11-09 LAB — CBC
MCV: 88 fL (ref 78.0–100.0)
Platelets: 185 10*3/uL (ref 150–400)
RBC: 4.43 MIL/uL (ref 4.22–5.81)
WBC: 7.4 10*3/uL (ref 4.0–10.5)

## 2012-11-09 LAB — COMPREHENSIVE METABOLIC PANEL
ALT: 16 U/L (ref 0–53)
AST: 15 U/L (ref 0–37)
CO2: 28 mEq/L (ref 19–32)
Chloride: 105 mEq/L (ref 96–112)
Creatinine, Ser: 0.66 mg/dL (ref 0.50–1.35)
GFR calc non Af Amer: >90 mL/min (ref >90)
Total Bilirubin: 0.9 mg/dL (ref 0.3–1.2)

## 2012-11-09 LAB — HEMOGLOBIN A1C: Hgb A1c MFr Bld: 5.8 % — ABNORMAL HIGH (ref <5.7)

## 2012-11-09 LAB — PROTIME-INR: INR: 1.13 (ref 0.00–1.49)

## 2012-11-09 MED ORDER — ENOXAPARIN SODIUM 150 MG/ML ~~LOC~~ SOLN
150.0000 mg | SUBCUTANEOUS | Status: DC
  Filled 2012-11-09 (×2): qty 1

## 2012-11-09 MED ORDER — WARFARIN - PHARMACIST DOSING INPATIENT: Status: DC

## 2012-11-09 MED ORDER — WARFARIN SODIUM 10 MG PO TABS: 10.0000 mg | ORAL_TABLET | Freq: Every day | ORAL | Status: DC

## 2012-11-09 MED ORDER — ENOXAPARIN SODIUM 120 MG/0.8ML ~~LOC~~ SOLN
SUBCUTANEOUS | Status: AC
  Filled 2012-11-09: qty 0.8

## 2012-11-09 MED ORDER — ENOXAPARIN SODIUM 150 MG/ML ~~LOC~~ SOLN: 150.0000 mg | SUBCUTANEOUS | Status: DC

## 2012-11-09 MED ORDER — WARFARIN SODIUM 10 MG PO TABS: 10.0000 mg | ORAL_TABLET | Freq: Once | ORAL | Status: DC

## 2012-11-09 NOTE — Progress Notes (Signed)
[facility] PROGRESS NOTE  Steven Oneal ZOX:[id number] DOB: 1945/06/01 DOA: 11/08/2012 PCP: [name], MD Cardiologist Dr Allyson Sabal  Assessment/Plan: 1. Acute pulmonary embolism: Asymptomatic. Continue anticoagulation. 2. Left lower remedy DVT in the popliteal vein: Somewhat improved. Continue anticoagulation. 3. History of coronary artery disease: On aspirin stent was 2007. Can likely discontinue Plavix. I will discuss with Dr. Allyson Sabal 7/14. Plavix as an outpatient.   Continue warfarin and Lovenox on discharge  Followup with primary care physician 7/14 for PT/INR  Code Status: Full DVT prophylaxis: Lovenox, warfarin Family Communication: None present Disposition Plan: Home  Brendia Sacks, MD  [facility]  Pager [phone] If 7PM-7AM, please contact night-coverage at [email or web address], password [facility] 11/09/2012, 11:44 AM  LOS: 1 day   Clinical Summary: 67 year old man presented for an outpatient lower extremity venous Doppler secondary to a swollen left leg. This was positive for DVT. In emergency department he was found pulmonary embolism and referred for admission.  Consultants:  None  Procedures:  None  HPI/Subjective: Feels well. No complaints. No chest pain or shortness of breath. Some leg pain and swelling.  Objective: Filed Vitals:   11/08/12 1756 11/08/12 1925 11/08/12 2051 11/09/12 0614  BP: 119/65 144/77 129/74 105/65  Pulse: 85 86 84 77  Temp: 97.5 F (36.4 C)  97.5 F (36.4 C) 97.6 F (36.4 C)  TempSrc: Oral  Oral Oral  Resp:  24 20 18   Height:      Weight:      SpO2: 94% 96% 99% 93%    Intake/Output Summary (Last 24 hours) at 11/09/12 1144 Last data filed at 11/09/12 0834  Gross per 24 hour  Intake 531.67 ml  Output   1125 ml  Net -593.33 ml     Filed Weights   11/08/12 1331  Weight: 103.42 kg (228 lb)    Exam:   Afebrile, vital signs stable  General: Appears calm and comfortable.  Cardiovascular: Regular rate and  rhythm. No murmur, rub, gallop.   Telemetry: Sinus rhythm. No arrhythmias.  Musculoskeletal: Left lower short he edema at ankle up mid lower leg. Nontender.  Psychiatric: Grossly normal mood and affect. Speech fluent and appropriate.  Data Reviewed:  Complete metabolic panel unremarkable. CBC normal. INR 1.13.  Urinalysis negative.  Left lower extremity DVT in the popliteal vein  CT chest positive for pulmonary embolus left lower lobe arterial branch  Pending studies:   TSH  Hemoglobin A1c  Scheduled Meds: . amLODipine  5 mg Oral Daily  . aspirin EC  81 mg Oral Daily  . clopidogrel  75 mg Oral Daily  . docusate sodium  100 mg Oral BID  . enoxaparin (LOVENOX) injection  1 mg/kg Subcutaneous Q12H  . losartan  50 mg Oral Daily  . nebivolol  10 mg Oral BID  . niacin  1,000 mg Oral QHS  . omega-3 acid ethyl esters  1 g Oral Daily  . pantoprazole  40 mg Oral Daily  . simvastatin  20 mg Oral q1800  . sodium chloride  3 mL Intravenous Q12H  . warfarin  10 mg Oral Once  . Warfarin - Pharmacist Dosing Inpatient   Does not apply Q24H   Continuous Infusions: . 0.9 % NaCl with KCl 20 mEq / L 50 mL/hr at 11/08/12 2316    Active Problems:   GERD (gastroesophageal reflux disease)   DVT (deep venous thrombosis)   Pulmonary embolus   Mild obesity

## 2012-11-09 NOTE — Progress Notes (Signed)
Pt is to be discharged home today. Pt is in NAD, IV is out, all paperwork has been reviewed/discussed with patient, and there are no questions/concerns at this time. Assessment is unchanged from this morning. Pt is to be accompanied downstairs by staff and family via wheelchair.  

## 2012-11-09 NOTE — Progress Notes (Addendum)
ANTICOAGULATION CONSULT NOTE   Pharmacy Consult for Warfarin Indication: DVT / PE  Allergies  Allergen Reactions  . Codeine Itching, Nausea Only and Rash    Patient Measurements: Height: 5\' 9"  (175.3 cm) Weight: 228 lb (103.42 kg) IBW/kg (Calculated) : 70.7   Vital Signs: Temp: 97.6 F (36.4 C) (07/12 0614) Temp src: Oral (07/12 0614) BP: 105/65 mmHg (07/12 0614) Pulse Rate: 77 (07/12 0614)  Labs:  Recent Labs  11/08/12 1558 11/08/12 1613 11/09/12 0615  HGB 14.8 14.3 13.7  HCT 41.6 42.0 39.0  PLT 214  --  185  APTT 28  --   --   LABPROT 13.3  --  14.3  INR 1.03  --  1.13  CREATININE  --  0.70 0.66  TROPONINI <0.30  --   --     Estimated Creatinine Clearance: 106.2 ml/min (by C-G formula based on Cr of 0.66).   Medical History: Past Medical History  Diagnosis Date  . Allergic rhinitis   . Acid reflux   . CAD (coronary artery disease)     stents in 2007  . Hypertension   . Hyperlipidemia     Medications:  Scheduled:  . amLODipine  5 mg Oral Daily  . aspirin EC  81 mg Oral Daily  . clopidogrel  75 mg Oral Daily  . docusate sodium  100 mg Oral BID  . enoxaparin (LOVENOX) injection  1 mg/kg Subcutaneous Q12H  . losartan  50 mg Oral Daily  . nebivolol  10 mg Oral BID  . niacin  1,000 mg Oral QHS  . omega-3 acid ethyl esters  1 g Oral Daily  . pantoprazole  40 mg Oral Daily  . [COMPLETED] patient's guide to using coumadin book   Does not apply Once  . simvastatin  20 mg Oral q1800  . sodium chloride  3 mL Intravenous Q12H  . warfarin  10 mg Oral Once  . warfarin   Does not apply Once  . Warfarin - Pharmacist Dosing Inpatient   Does not apply Q24H    Assessment: VTE treatment for DVT & PE Patient on Lovenox 1 mg/kg every 12 hours  Goal of Therapy:  INR 2-3 Monitor platelets by anticoagulation protocol: Yes   Plan:  Coumadin 10 mg po today at 4 PM INR/PT daily CBC daily, monitor platelets Monitor renal function Labs per protocol Follow up  Coumadin education  Steven Oneal, Steven Oneal 11/09/2012,9:11 AM

## 2012-11-09 NOTE — Progress Notes (Signed)
Provided pt education. Pt was given coumadin pamphlet and video has been seen by pt. Pt currently has no new questions. Will continue to monitor and assess pt's needs for further education based on taking anticoagulants at home.

## 2012-11-09 NOTE — Discharge Summary (Signed)
Physician Discharge Summary  Steven Oneal NFA:[id number] DOB: August 02, 1945 DOA: 11/08/2012  PCP: Steven Ruths, MD  Admit date: 11/08/2012 Discharge date: 11/09/2012  Recommendations for Outpatient Follow-up:  1. Followup acute pulmonary embolism, with left lower extremity DVT 2. Followup PT/INR 7/14.  Lab Results  Component Value Date   INR 1.13 11/09/2012   INR 1.03 11/08/2012   INR 1.1 10/19/2006    3. Patient on aspirin and Plavix for history of coronary artery disease. Followup with cardiology, consider discontinuing Plavix.   Follow-up Information   Follow up with Steven Ruths, MD. Schedule an appointment as soon as possible for a visit on 11/11/2012. (for PT/INR check)    Contact information:   [address] [place] [address] [phone]      Discharge Diagnoses:  1. Acute pulmonary embolism 2. Left lower extremity DVT in the popliteal vein  Discharge Condition: Improved Disposition: Home  Diet recommendation: Heart healthy  Filed Weights   11/08/12 1331  Weight: 103.42 kg (228 lb)    History of present illness:  67 year old man presented for an outpatient lower extremity venous Doppler secondary to a swollen left leg. This was positive for DVT. In emergency department he was found pulmonary embolism and referred for admission.  Hospital Course:  Steven Oneal was started on Lovenox and Coumadin. He had no chest pain or shortness of breath and telemetry is unremarkable. His hospitalization was uncomplicated. He was instructed in Lovenox and Coumadin administration and demonstrate competence. I confirmed that pharmacy has Lovenox available. He is stable for discharge and will followup with his primary care physician 7/14 for PT/INR check.  Assessment/Plan:  1. Acute pulmonary embolism: Asymptomatic. Continue anticoagulation. 2. Left lower remedy DVT in the popliteal vein: Somewhat improved. Continue  anticoagulation. 3. History of coronary artery disease: On aspirin stent was 2007. Can likely discontinue Plavix. I will discuss with Steven Oneal 7/14.   Discharge Instructions  Discharge Orders   Future Orders Complete By Expires     Diet - low sodium heart healthy  As directed     Discharge instructions  As directed     Comments:      Be sure to take Lovenox and warfarin as directed. Call your physician or seek immediate medical attention for bleeding, chest pain, shortness of breath, worsening swelling or worsening of your condition.    Increase activity slowly  As directed         Medication List    STOP taking these medications       ALEVE PO      TAKE these medications       amLODipine 5 MG tablet  Commonly known as:  NORVASC  Take 5 mg by mouth daily.     aspirin 81 MG tablet  Take 81 mg by mouth daily.     clopidogrel 75 MG tablet  Commonly known as:  PLAVIX  Take 75 mg by mouth daily.     diazepam 10 MG tablet  Commonly known as:  VALIUM  Take 10 mg by mouth every 6 (six) hours as needed.     enoxaparin 150 MG/ML injection  Commonly known as:  LOVENOX  Inject 1 mL (150 mg total) into the skin daily. Start this evening 7/12 at 6 PM     fish oil-omega-3 fatty acids 1000 MG capsule  Take 3 g by mouth daily.     losartan 50 MG tablet  Commonly known as:  COZAAR  Take 50  mg by mouth daily.     nebivolol 10 MG tablet  Commonly known as:  BYSTOLIC  Take 10 mg by mouth 2 (two) times daily.     niacin 1000 MG CR tablet  Commonly known as:  NIASPAN  Take 1,000 mg by mouth at bedtime.     omeprazole 20 MG capsule  Commonly known as:  PRILOSEC  Take 20 mg by mouth daily.     pravastatin 40 MG tablet  Commonly known as:  PRAVACHOL  Take 1 tablet (40 mg total) by mouth daily.     warfarin 10 MG tablet  Commonly known as:  COUMADIN  Take 1 tablet (10 mg total) by mouth daily. Take each evening at the same time.       Allergies  Allergen Reactions  .  Codeine Itching, Nausea Only and Rash    The results of significant diagnostics from this hospitalization (including imaging, microbiology, ancillary and laboratory) are listed below for reference.    Significant Diagnostic Studies: Ct Angio Chest W/cm &/or Wo Cm  11/08/2012   *RADIOLOGY REPORT*  Clinical Data: Shortness of breath, elevated D-dimer  CT ANGIOGRAPHY CHEST  Technique:  Multidetector CT imaging of the chest using the standard protocol during bolus administration of intravenous contrast. Multiplanar reconstructed images including MIPs were obtained and reviewed to evaluate the vascular anatomy.  Contrast: OMNIPAQUE IOHEXOL 300 MG/ML  SOLN  Comparison: None  Findings: Images of the thoracic inlet are unremarkable.  Mild atherosclerotic calcifications of the thoracic aorta.  No aortic aneurysm or dissection.  The study is of excellent technical quality.  Thrombus is noted in the left lower lobe pulmonary artery.  A small amount of thrombus is seen extending into at least two segmental arterial branches in  left lower lobe.  This is best seen in axial image 41.  Atherosclerotic calcifications of the coronary artery.  Heart size is within normal limits.  The visualized upper abdomen shows no adrenal gland mass.  Gallbladder is contracted.  Question tiny gallstones.  Images of the lung parenchyma shows no acute infiltrate or pulmonary edema.  No pulmonary nodules.  Sagittal images of the spine shows no destructive bony lesions.  Degenerative changes thoracic spine are noted.  IMPRESSION:  1.  Study is positive for pulmonary embolus with thrombus noted in the left lower lobe arterial branch please see axial image 41. 2.  No acute infiltrate or pulmonary edema.  No adenopathy. 3.  Contracted gallbladder.  Question small gallstones.  Critical findings discussed with Steven Oneal   Original Report Authenticated By: Natasha Mead, M.D.   US Venous Img Lower Bilateral  11/08/2012   *RADIOLOGY REPORT*   Clinical Data: Bilateral leg swelling  RIGHT LOWER EXTREMITY VENOUS DUPLEX ULTRASOUND  Technique:  Gray-scale sonography with graded compression, as well as color Doppler and duplex ultrasound were performed to evaluate the deep venous system of the lower extremity from the level of the common femoral vein through the popliteal and proximal calf veins. Spectral Doppler was utilized to evaluate flow at rest and with distal augmentation maneuvers.  Comparison:  None.  Findings:  Normal compressibility of the common femoral, superficial femoral, and popliteal veins is demonstrated, as well as the visualized proximal calf veins.  No filling defects to suggest DVT on grayscale or color Doppler imaging.  Doppler waveforms show normal direction of venous flow, normal respiratory phasicity and response to augmentation.  IMPRESSION: No evidence of lower extremity deep vein thrombosis.  *RADIOLOGY REPORT*  Clinical  Data: Bilateral leg swelling  LEFT LOWER EXTREMITY VENOUS DUPLEX ULTRASOUND  Technique:  Gray-scale sonography with graded compression, as well as color Doppler and duplex ultrasound, were performed to evaluate the deep venous system of the lower extremity from the level of the common femoral vein through the popliteal and proximal calf veins. Spectral Doppler was utilized to evaluate flow at rest and with distal augmentation maneuvers.  Comparison:  None.  Findings: The left common femoral vein and superficial femoral vein are patent with normal compressibility and normal Doppler signal.  There is clot in the popliteal vein.  This is partially occlusive proximally and occlusive distally.  IMPRESSION: Segmental DVT in the left popliteal vein which is occlusive in the distal popliteal vein.   Original Report Authenticated By: Janeece Riggers, M.D.   Labs: Basic Metabolic Panel:  Recent Labs Lab 11/08/12 1613 11/09/12 0615  NA 141 140  K 3.7 3.7  CL 104 105  CO2  --  28  GLUCOSE 100* 141*  BUN 4* 6   CREATININE 0.70 0.66  CALCIUM  --  8.8   Liver Function Tests:  Recent Labs Lab 11/09/12 0615  AST 15  ALT 16  ALKPHOS 39  BILITOT 0.9  PROT 5.7*  ALBUMIN 3.1*   CBC:  Recent Labs Lab 11/08/12 1558 11/08/12 1613 11/09/12 0615  WBC 6.8  --  7.4  NEUTROABS 3.8  --   --   HGB 14.8 14.3 13.7  HCT 41.6 42.0 39.0  MCV 87.8  --  88.0  PLT 214  --  185   Cardiac Enzymes:  Recent Labs Lab 11/08/12 1558  TROPONINI <0.30    Active Problems:   GERD (gastroesophageal reflux disease)   DVT (deep venous thrombosis)   Pulmonary embolus   Mild obesity   Time coordinating discharge: 25 minutes  Signed:  Brendia Sacks, MD [facility] 11/09/2012, 12:26 PM

## 2012-11-14 ENCOUNTER — Other Ambulatory Visit: Payer: Self-pay

## 2012-11-14 MED ORDER — CARVEDILOL 6.25 MG PO TABS: 6.2500 mg | ORAL_TABLET | Freq: Two times a day (BID) | ORAL | Status: DC

## 2012-11-14 MED ORDER — LOSARTAN POTASSIUM 50 MG PO TABS: 50.0000 mg | ORAL_TABLET | Freq: Every day | ORAL | Status: DC

## 2012-11-14 NOTE — Telephone Encounter (Signed)
Rx was sent to pharmacy electronically. 

## 2012-11-15 ENCOUNTER — Other Ambulatory Visit: Payer: Self-pay | Admitting: *Deleted

## 2012-11-21 ENCOUNTER — Telehealth: Payer: Self-pay | Admitting: Family Medicine

## 2012-11-21 NOTE — Telephone Encounter (Signed)
Discussed with Dr. Jolayne Haines to stop Plavix.  Updated patient by telephone. He is doing well. He understands to stop Plavix. Continue ASA.  Brendia Sacks, MD [facility]

## 2012-12-17 ENCOUNTER — Other Ambulatory Visit: Payer: Self-pay | Admitting: Cardiovascular Disease

## 2012-12-17 MED ORDER — LOSARTAN POTASSIUM 50 MG PO TABS: 50.0000 mg | ORAL_TABLET | Freq: Every day | ORAL | Status: DC

## 2013-01-02 ENCOUNTER — Ambulatory Visit (INDEPENDENT_AMBULATORY_CARE_PROVIDER_SITE_OTHER): Payer: Medicare Other | Admitting: Cardiovascular Disease

## 2013-01-02 ENCOUNTER — Encounter: Payer: Self-pay | Admitting: Cardiovascular Disease

## 2013-01-02 VITALS — BP 102/72 | HR 82 | Ht 69.0 in | Wt 224.0 lb

## 2013-01-02 DIAGNOSIS — I82402 Acute embolism and thrombosis of unspecified deep veins of left lower extremity: Secondary | ICD-10-CM

## 2013-01-02 DIAGNOSIS — I82409 Acute embolism and thrombosis of unspecified deep veins of unspecified lower extremity: Secondary | ICD-10-CM

## 2013-01-02 DIAGNOSIS — I2510 Atherosclerotic heart disease of native coronary artery without angina pectoris: Secondary | ICD-10-CM

## 2013-01-02 DIAGNOSIS — I10 Essential (primary) hypertension: Secondary | ICD-10-CM

## 2013-01-02 DIAGNOSIS — E785 Hyperlipidemia, unspecified: Secondary | ICD-10-CM

## 2013-01-02 NOTE — Patient Instructions (Addendum)
  We will see you back in follow up in 6 months with Dr Allyson Sabal  Dr Allyson Sabal has ordered a lower extremity venous doppler in December 2014

## 2013-01-02 NOTE — Assessment & Plan Note (Signed)
The patient had a left lower Jimi DVT in July of this year with a pulmonary embolus. He was hospitalized and she was Lovenox and his transition to oral Coumadin anticoagulation. His swelling resolved. I'm going to get a venous duplex on him in December and consider stopping his Coumadin regulation after that if his DVT has resolved.

## 2013-01-02 NOTE — Assessment & Plan Note (Signed)
On statin therapy with recent lipid profile performed 07/03/12 revealing a total cholesterol of 126, LDL of 66 and HDL of 41

## 2013-01-02 NOTE — Assessment & Plan Note (Signed)
Patient has a history of CAD status post PCI and stenting by myself in the past. He had an intraoral myocardial infarction February of 07. I restarted him 10/22/06 revealing patent stents in the proximal RCA with noncritical disease in his proximal LAD and normal LV function. His last Myoview performed in 2007 was nonischemic. He denies chest pain or shortness of breath.

## 2013-01-02 NOTE — Progress Notes (Signed)
01/02/2013 Steven Oneal   [birth year]  [id number]  Primary Physician Steven Ruths, MD Primary Cardiologist: Steven Gess MD Steven Oneal   HPI:  The patient is a 67 year old, moderately overweight, married Caucasian male, father of 2 who I last saw a year ago. He has a history of CAD status post multivessel PCI and stenting by myself in the past. He did have an inferior wall myocardial infarction February 2007. I restudied him October 22, 2006, revealing patent stents in his proximal RCA and noncritical disease in his proximal LAD with normal LV function. His last Myoview performed in 2007 was nonischemic. He denies chest pain or shortness of breath. His most recent lipid profile performed by Dr. Regino Oneal i3/5/14revealed total cholesterol 126, LDL 66 and HDL of 41. He did have a left lobes of a DVT in July of this year with a pulmonary embolus 2 with Lovenox and Coumadin anticoagulation.      Current Outpatient Prescriptions  Medication Sig Dispense Refill  . amLODipine (NORVASC) 5 MG tablet Take 5 mg by mouth daily.      Marland Kitchen aspirin 81 MG tablet Take 81 mg by mouth daily.        . carvedilol (COREG) 6.25 MG tablet       . diazepam (VALIUM) 10 MG tablet Take 10 mg by mouth every 6 (six) hours as needed.        . fish oil-omega-3 fatty acids 1000 MG capsule Take 3 g by mouth daily.        Marland Kitchen losartan (COZAAR) 50 MG tablet Take 1 tablet (50 mg total) by mouth daily.  30 tablet  1  . niacin (NIASPAN) 1000 MG CR tablet Take 1,000 mg by mouth at bedtime.        Marland Kitchen omeprazole (PRILOSEC) 20 MG capsule Take 20 mg by mouth daily.        . pravastatin (PRAVACHOL) 40 MG tablet Take 1 tablet (40 mg total) by mouth daily.  30 tablet  9  . warfarin (COUMADIN) 10 MG tablet Take 1 tablet (10 mg total) by mouth daily. Take each evening at the same time.  30 tablet  0  . clopidogrel (PLAVIX) 75 MG tablet Take 75 mg by mouth daily.        Marland Kitchen enoxaparin (LOVENOX) 150 MG/ML injection Inject 1 mL  (150 mg total) into the skin daily. Start this evening 7/12 at 6 PM  7 Syringe  0   No current facility-administered medications for this visit.    Allergies  Allergen Reactions  . Codeine Itching, Nausea Only and Rash    History   Social History  . Marital Status: Married    Spouse Name: N/A    Number of Children: 2  . Years of Education: N/A   Occupational History  . disablity     CAD   Social History Main Topics  . Smoking status: Former Games developer  . Smokeless tobacco: Never Used  . Alcohol Use: No  . Drug Use: No  . Sexual Activity: Yes    Partners: Female    Pharmacist, hospital Protection: None     Comment: spouse   Other Topics Concern  . Not on file   Social History Narrative  . No narrative on file     Review of Systems: General: negative for chills, fever, night sweats or weight changes.  Cardiovascular: negative for chest pain, dyspnea on exertion, edema, orthopnea, palpitations, paroxysmal nocturnal dyspnea or shortness of breath Dermatological: negative  for rash Respiratory: negative for cough or wheezing Urologic: negative for hematuria Abdominal: negative for nausea, vomiting, diarrhea, bright red blood per rectum, melena, or hematemesis Neurologic: negative for visual changes, syncope, or dizziness All other systems reviewed and are otherwise negative except as noted above.    Blood pressure 102/72, pulse 82, height 5\' 9"  (1.753 m), weight 224 lb (101.606 kg).  General appearance: alert and no distress Neck: no adenopathy, no carotid bruit, no JVD, supple, symmetrical, trachea midline and thyroid not enlarged, symmetric, no tenderness/mass/nodules Lungs: clear to auscultation bilaterally Heart: regular rate and rhythm, S1, S2 normal, no murmur, click, rub or gallop Extremities: extremities normal, atraumatic, no cyanosis or edema  EKG normal sinus rhythm 82 without ST or T wave changes.  ASSESSMENT AND PLAN:   Coronary artery disease Patient has a  history of CAD status post PCI and stenting by myself in the past. He had an intraoral myocardial infarction February of 07. I restarted him 10/22/06 revealing patent stents in the proximal RCA with noncritical disease in his proximal LAD and normal LV function. His last Myoview performed in 2007 was nonischemic. He denies chest pain or shortness of breath.  Essential hypertension Well-controlled on current medications  Hyperlipidemia On statin therapy with recent lipid profile performed 07/03/12 revealing a total cholesterol of 126, LDL of 66 and HDL of 41  DVT (deep venous thrombosis) The patient had a left lower Jimi DVT in July of this year with a pulmonary embolus. He was hospitalized and she was Lovenox and his transition to oral Coumadin anticoagulation. His swelling resolved. I'm going to get a venous duplex on him in December and consider stopping his Coumadin regulation after that if his DVT has resolved.      Steven Gess MD FACP,FACC,FAHA, [facility] 01/02/2013 2:20 PM

## 2013-01-02 NOTE — Assessment & Plan Note (Signed)
Well-controlled on current medications 

## 2013-02-10 ENCOUNTER — Other Ambulatory Visit: Payer: Self-pay | Admitting: Cardiovascular Disease

## 2013-02-10 ENCOUNTER — Other Ambulatory Visit: Payer: Self-pay | Admitting: *Deleted

## 2013-02-10 MED ORDER — AMLODIPINE BESYLATE 5 MG PO TABS: 5.0000 mg | ORAL_TABLET | Freq: Every day | ORAL | Status: DC

## 2013-02-10 NOTE — Telephone Encounter (Signed)
Rx was sent to pharmacy electronically. 

## 2013-02-12 ENCOUNTER — Other Ambulatory Visit: Payer: Self-pay | Admitting: Cardiovascular Disease

## 2013-02-12 NOTE — Telephone Encounter (Signed)
Rx was sent to pharmacy electronically. 

## 2013-04-07 ENCOUNTER — Telehealth: Payer: Self-pay | Admitting: Cardiovascular Disease

## 2013-04-07 ENCOUNTER — Ambulatory Visit (HOSPITAL_COMMUNITY)
Admission: RE | Admit: 2013-04-07 | Discharge: 2013-04-07 | Disposition: A | Discharge status: Home / Self Care | Payer: Medicare Other | Source: Ambulatory Visit | Attending: Cardiovascular Disease | Admitting: Cardiovascular Disease

## 2013-04-07 DIAGNOSIS — I825Y9 Chronic embolism and thrombosis of unspecified deep veins of unspecified proximal lower extremity: Secondary | ICD-10-CM | POA: Insufficient documentation

## 2013-04-07 DIAGNOSIS — I82402 Acute embolism and thrombosis of unspecified deep veins of left lower extremity: Secondary | ICD-10-CM

## 2013-04-07 DIAGNOSIS — Z7901 Long term (current) use of anticoagulants: Secondary | ICD-10-CM | POA: Insufficient documentation

## 2013-04-07 DIAGNOSIS — I803 Phlebitis and thrombophlebitis of lower extremities, unspecified: Secondary | ICD-10-CM

## 2013-04-07 DIAGNOSIS — E785 Hyperlipidemia, unspecified: Secondary | ICD-10-CM

## 2013-04-07 NOTE — Telephone Encounter (Signed)
Spoke with patient who reported that on thanksgiving evening took Niaspan before going to bed and was flushed, red skin, heart fluttering - took a "nerve pill" and went back to bed, but awoke with continued palpitations. patient reports he did not take nNaspan on Friday and only took 1/2 a pill on Saturday 11/29.Marland Kitchen Since then, he talked with his brother who recommended red yeast rice and patient reports that he stopped Pravastatin and Niaspan on 12/1 and has been take 2 caps of red yeast rice every morning and 2 capsules at lunch. Reports that he does not have any complaints currently and is sleeping well. Also reports that insurance will not cover his Niaspan anymore and left paperwork with front desk staff? Informed patient that Dr. Allyson Sabal and his nurse would be notified of his issues with Niaspan and his medication changes and he would be contacted with any further instructions.

## 2013-04-07 NOTE — Progress Notes (Signed)
Left Lower Ext. Venous Duplex Completed. Laylanie Kruczek, BS, RDMS, RVT  

## 2013-04-07 NOTE — Progress Notes (Addendum)
Left Lower Ext. Venous Duplex Completed. Chronic appearing partial DVT is still present in the distal popliteal vein. Marilynne Halsted, BS, RDMS, RVT

## 2013-04-07 NOTE — Telephone Encounter (Signed)
Patient was here for a test today.  Would like someone to call him. He takes Niaspan which often causes hot flashes.  When he took it Thursday night, his heart starting racing and he had hot flashes and flushing. (Thought he was going to have to call 911). He got up and took 10 mg Valium and drank a coke.  Went back to bed but did not feel good on Friday. What to do? He also left a notice from his insurance company to be filled out for generic Niaspan.  They will no longer cover brand name.

## 2013-04-08 NOTE — Telephone Encounter (Signed)
I advised patient to stay on Red Yeast Rice and have blood work repeated in 3 months to see what the cholesterol num bers are on just Red Yeast Rice.  Pt verbalized understanding and is agreeable.

## 2013-04-15 ENCOUNTER — Encounter: Payer: Self-pay | Admitting: Physician Assistant

## 2013-04-15 ENCOUNTER — Telehealth: Payer: Self-pay | Admitting: *Deleted

## 2013-04-15 ENCOUNTER — Ambulatory Visit (INDEPENDENT_AMBULATORY_CARE_PROVIDER_SITE_OTHER): Payer: Medicare Other | Admitting: Physician Assistant

## 2013-04-15 VITALS — BP 130/80 | HR 84 | Ht 69.0 in | Wt 226.0 lb

## 2013-04-15 DIAGNOSIS — I4891 Unspecified atrial fibrillation: Secondary | ICD-10-CM

## 2013-04-15 DIAGNOSIS — I10 Essential (primary) hypertension: Secondary | ICD-10-CM

## 2013-04-15 DIAGNOSIS — R002 Palpitations: Secondary | ICD-10-CM

## 2013-04-15 MED ORDER — CARVEDILOL 6.25 MG PO TABS: 9.3750 mg | ORAL_TABLET | Freq: Two times a day (BID) | ORAL | Status: DC

## 2013-04-15 NOTE — Assessment & Plan Note (Signed)
The most severe episode was on Thanksgiving(maybe SVT/VT or afib?) but he still as been having intermittent fluttering in his chest along with dizziness.  I have ordered a HR monitor and increased his coreg to 9.375mg  BID.

## 2013-04-15 NOTE — Progress Notes (Signed)
Date:  04/15/2013   ID:  [name], DOB 06-16-1945, MRN [id number]  PCP:  [name], MD  Primary Cardiologist:  Allyson Sabal     History of Present Illness: Steven Oneal is a 67 y.o. male  The patient is a 67 year old, Obese, married Caucasian male, father of 2 who I last saw a year ago. He has a history of CAD status post multivessel PCI and stenting by myself in the past. He did have an inferior wall myocardial infarction February 2007. Dr. Allyson Sabal restudied him October 22, 2006, revealing patent stents in his proximal RCA and noncritical disease in his proximal LAD with normal LV function. His last Myoview performed in 2007 was nonischemic.  His most recent lipid profile performed by Dr. Regino Schultze 3/5/14revealed total cholesterol 126, LDL 66 and HDL of 41. He did have a left lower ext DVT in July of this year with a pulmonary embolus. On Coumadin anticoagulation.  He presents today with complaints of increased HR, dizziness, Flushed in the face, CP, SOB Diaphoresis.  This occurred on Thanksgiving evening. He took a nerve pill and eventually it stopped.  He has not had a big episode since.  The CP, which he has been having intermittently in the left axillae, is sharp at about 4/10.  He attributes it to gas.  He also has some mild LEE.  The patient currently denies nausea, vomiting, fever, orthopnea, PND, cough, congestion, abdominal pain, hematochezia, melena, claudication.  Wt Readings from Last 3 Encounters:  04/15/13 226 lb (102.513 kg)  01/02/13 224 lb (101.606 kg)  11/08/12 228 lb (103.42 kg)     Past Medical History  Diagnosis Date  . Allergic rhinitis   . Acid reflux   . CAD (coronary artery disease)     stents in 2007  . Hypertension   . Hyperlipidemia   . DVT of leg (deep venous thrombosis)   . Pulmonary embolus   . On continuous oral anticoagulation     Current Outpatient Prescriptions  Medication Sig Dispense Refill  . amLODipine (NORVASC) 5 MG tablet  Take 1 tablet (5 mg total) by mouth daily.  90 tablet  3  . aspirin 81 MG tablet Take 81 mg by mouth daily.        . carvedilol (COREG) 6.25 MG tablet Take 1.5 tablets (9.375 mg total) by mouth 2 (two) times daily with a meal.  90 tablet  6  . diazepam (VALIUM) 10 MG tablet Take 10 mg by mouth every 6 (six) hours as needed.        . fish oil-omega-3 fatty acids 1000 MG capsule Take 3 g by mouth daily.        Marland Kitchen losartan (COZAAR) 50 MG tablet Take 1 tablet (50 mg total) by mouth daily.  30 tablet  6  . omeprazole (PRILOSEC) 20 MG capsule Take 20 mg by mouth daily.        . pravastatin (PRAVACHOL) 40 MG tablet Take 1 tablet (40 mg total) by mouth daily.  30 tablet  9  . Red Yeast Rice 600 MG TABS Take by mouth.      . warfarin (COUMADIN) 10 MG tablet Take 1 tablet (10 mg total) by mouth daily. Take each evening at the same time.  30 tablet  0   No current facility-administered medications for this visit.    Allergies:    Allergies  Allergen Reactions  . Codeine Itching, Nausea Only and Rash    Social History:  The patient  reports that he has quit smoking. He has never used smokeless tobacco. He reports that he does not drink alcohol or use illicit drugs.   Family history:   Family History  Problem Relation Age of Onset  . Colon cancer Mother 21  . Colon cancer Paternal Uncle 71  . Colon cancer Brother 70    rectal cancer  . Colon cancer Paternal Uncle 3  . Throat cancer Brother 74  . Prostate cancer Brother   . Pulmonary embolism Brother     with cancer    ROS:  Please see the history of present illness.  All other systems reviewed and negative.   PHYSICAL EXAM: VS:  BP 130/80  Pulse 84  Ht 5\' 9"  (1.753 m)  Wt 226 lb (102.513 kg)  BMI 33.36 kg/m2 Obese, well developed, in no acute distress HEENT: Pupils are equal round react to light accommodation extraocular movements are intact.  Neck: no JVDNo cervical lymphadenopathy. Cardiac: Regular rate and rhythm without murmurs  rubs or gallops.  Occasional ectopy. Lungs:  clear to auscultation bilaterally, no wheezing, rhonchi or rales Ext: 1+ lower extremity edema.  2+ radial and PT pulses. Skin: warm and dry Neuro:  Grossly normal  EKG:  Sinus rhythm 1st degree AVB. QTc .  PACs.   ASSESSMENT AND PLAN:  Problem List Items Addressed This Visit   Essential hypertension     BP at target.  Increased coreg will further improve it.    Relevant Medications      carvedilol (COREG) tablet   Palpitations - Primary     The most severe episode was on Thanksgiving(maybe SVT/VT or afib?) but he still as been having intermittent fluttering in his chest along with dizziness.  I have ordered a HR monitor and increased his coreg to 9.375mg  BID.      Relevant Orders      EKG 12-Lead

## 2013-04-15 NOTE — Patient Instructions (Signed)
1.  Wear a Cardionet HR monitor for two weeks. 2.  Increase Coreg to 9.375mg  twice daily. 3.  Follow up in two weeks with Dr. Allyson Sabal or PA

## 2013-04-15 NOTE — Assessment & Plan Note (Addendum)
BP at target.  Increased coreg will further improve it.

## 2013-04-21 ENCOUNTER — Encounter: Payer: Self-pay | Admitting: Cardiology

## 2013-04-22 ENCOUNTER — Encounter: Payer: Self-pay | Admitting: Cardiovascular Disease

## 2013-04-29 ENCOUNTER — Encounter: Payer: Self-pay | Admitting: Cardiology

## 2013-04-29 ENCOUNTER — Ambulatory Visit (INDEPENDENT_AMBULATORY_CARE_PROVIDER_SITE_OTHER): Payer: Medicare Other | Admitting: Cardiology

## 2013-04-29 VITALS — BP 120/80 | HR 68 | Ht 69.0 in | Wt 221.0 lb

## 2013-04-29 DIAGNOSIS — E785 Hyperlipidemia, unspecified: Secondary | ICD-10-CM

## 2013-04-29 DIAGNOSIS — R079 Chest pain, unspecified: Secondary | ICD-10-CM

## 2013-04-29 DIAGNOSIS — I209 Angina pectoris, unspecified: Secondary | ICD-10-CM | POA: Insufficient documentation

## 2013-04-29 DIAGNOSIS — R002 Palpitations: Secondary | ICD-10-CM

## 2013-04-29 DIAGNOSIS — Z7901 Long term (current) use of anticoagulants: Secondary | ICD-10-CM | POA: Insufficient documentation

## 2013-04-29 MED ORDER — CARVEDILOL 6.25 MG PO TABS: 6.2500 mg | ORAL_TABLET | Freq: Two times a day (BID) | ORAL | Status: DC

## 2013-04-29 NOTE — Patient Instructions (Signed)
Decrease Coreg back to 6.25 mg BID. Your physician has requested that you have en exercise stress myoview. For further information please visit [email or web address] Please follow instruction sheet, as given. Your physician recommends that you schedule a follow-up appointment in: one month with Dr Allyson Sabal

## 2013-04-29 NOTE — Assessment & Plan Note (Signed)
Associated with tachycardia  

## 2013-04-29 NOTE — Progress Notes (Signed)
04/29/2013 CASHEL BELLINA   [birth year]  [id number]  Primary Physicia [name], MD Primary Cardiologist: Dr Allyson Sabal  HPI:  67 y/o male who has a history of CAD status post PCI and stenting of his RCA by Dr Allyson Sabal Feb 2007 in the setting of an MI. He was restudied in June  2008, and had patent stents in his proximal RCA and noncritical disease in his proximal LAD with normal LV function. He has not had a Myoview since and has done well from a cardiac standpoint. He did have a PE/DVT in July 2014 and is on chronic Coumadin. He recently saw Wilburt Finlay after he had palpitations and chest pain Thanksgiving night. The pt says his heart was racing, he was sweaty, and he had pain across his chest. He told me  "thought I was having a heart attack". Judie Grieve increased his Coreg and the pt stopped his Niaspan suspecting this had something to do with his symptoms, he had been on this since 2008. Since he stopped the Niaspan he has done well. He denies any further chest pain or palpations. His Holter showed PACs, PVCs, and sinus arrythmia with transient bradycardia and sinus pauses up to 2.7 seconds. He denies any syncope or pre syncope.     Current Outpatient Prescriptions  Medication Sig Dispense Refill  . amLODipine (NORVASC) 5 MG tablet Take 1 tablet (5 mg total) by mouth daily.  90 tablet  3  . aspirin 81 MG tablet Take 81 mg by mouth daily.        . diazepam (VALIUM) 10 MG tablet Take 10 mg by mouth every 6 (six) hours as needed.        . fish oil-omega-3 fatty acids 1000 MG capsule Take 3 g by mouth daily.        Marland Kitchen losartan (COZAAR) 50 MG tablet Take 1 tablet (50 mg total) by mouth daily.  30 tablet  6  . omeprazole (PRILOSEC) 20 MG capsule Take 20 mg by mouth daily.        . pravastatin (PRAVACHOL) 40 MG tablet Take 1 tablet (40 mg total) by mouth daily.  30 tablet  9  . warfarin (COUMADIN) 10 MG tablet Take 1 tablet (10 mg total) by mouth daily. Take each evening at the same time.  30 tablet  0   . carvedilol (COREG) 6.25 MG tablet Take 1 tablet (6.25 mg total) by mouth 2 (two) times daily.  180 tablet  3   No current facility-administered medications for this visit.    Allergies  Allergen Reactions  . Codeine Itching, Nausea Only and Rash    History   Social History  . Marital Status: Married    Spouse Name: N/A    Number of Children: 2  . Years of Education: N/A   Occupational History  . disablity     CAD   Social History Main Topics  . Smoking status: Former Games developer  . Smokeless tobacco: Never Used  . Alcohol Use: No  . Drug Use: No  . Sexual Activity: Yes    Partners: Female    Pharmacist, hospital Protection: None     Comment: spouse   Other Topics Concern  . Not on file   Social History Narrative  . No narrative on file     Review of Systems: General: negative for chills, fever, night sweats or weight changes.  Cardiovascular: negative for chest pain, dyspnea on exertion, edema, orthopnea, palpitations, paroxysmal nocturnal dyspnea or shortness of breath  Dermatological: negative for rash Respiratory: negative for cough or wheezing Urologic: negative for hematuria Abdominal: negative for nausea, vomiting, diarrhea, bright red blood per rectum, melena, or hematemesis Neurologic: negative for visual changes, syncope, or dizziness All other systems reviewed and are otherwise negative except as noted above.    Blood pressure 120/80, pulse 68, height 5\' 9"  (1.753 m), weight 221 lb (100.245 kg).  General appearance: alert, cooperative and no distress Neck: no carotid bruit and no JVD Lungs: clear to auscultation bilaterally Heart: regular rate and rhythm    ASSESSMENT AND PLAN:   Palpitations Better off Niaspan. PACs, PVCs, sinus arrythmia on Holter  Hyperlipidemia LDL 66, HDL 41 March 2014.   Chest pain Associated with tachycardia.   PLAN: I suggested Mr Shatto stay off his Niaspan. His brother in law suggested he take red yeast rice but I  discouraged this as he is already on a statin. I did cut his Coreg back to his previous dose and ordered an exercise Myoview. He will see Dr Allyson Sabal in follow up.   Sierria Bruney KPA-C 04/29/2013 2:28 PM

## 2013-04-29 NOTE — Assessment & Plan Note (Signed)
LDL 66, HDL 41 March 2014.

## 2013-04-29 NOTE — Assessment & Plan Note (Signed)
Better off Niaspan. PACs, PVCs, sinus arrythmia on Holter

## 2013-05-07 ENCOUNTER — Encounter (HOSPITAL_COMMUNITY): Payer: Medicare Other

## 2013-05-07 ENCOUNTER — Other Ambulatory Visit (HOSPITAL_COMMUNITY): Payer: Self-pay | Admitting: *Deleted

## 2013-05-08 ENCOUNTER — Ambulatory Visit (HOSPITAL_COMMUNITY)
Admission: RE | Admit: 2013-05-08 | Discharge: 2013-05-08 | Disposition: A | Discharge status: Home / Self Care | Payer: Medicare Other | Source: Ambulatory Visit | Attending: Cardiovascular Disease | Admitting: Cardiovascular Disease

## 2013-05-08 DIAGNOSIS — R079 Chest pain, unspecified: Secondary | ICD-10-CM

## 2013-05-08 MED ORDER — TECHNETIUM TC 99M SESTAMIBI GENERIC - CARDIOLITE
30.2000 | Freq: Once | INTRAVENOUS | Status: AC | PRN
  Administered 2013-05-08: 30.2 via INTRAVENOUS

## 2013-05-08 MED ORDER — TECHNETIUM TC 99M SESTAMIBI GENERIC - CARDIOLITE
10.4000 | Freq: Once | INTRAVENOUS | Status: AC | PRN
  Administered 2013-05-08: 10 via INTRAVENOUS

## 2013-05-08 NOTE — Procedures (Addendum)
 [facility] [address] [place] [address] [phone]  Cardiology Nuclear Med Study  Steven Oneal is a 68 y.o. male     MRN : [id number]     DOB: February 09, 1946  Procedure Date: 05/08/2013  Nuclear Med Background Indication for Stress Test:  Evaluation for Ischemia and Stent Patency History:  CAD;MI-2007;STENT/PTCA--06/15/2005;Pulmonary emboli;DVT of leg; Cardiac Risk Factors: Family History - CAD, History of Smoking, Hypertension, Lipids and Obesity  Symptoms:  Chest Pain, Dizziness, Light-Headedness, Palpitations and SOB   Nuclear Pre-Procedure Caffeine/Decaff Intake:  12:00am NPO After: 10 am   IV Site: R Forearm  IV 0.9% NS with Angio Cath:  22g  Chest Size (in):  44"  IV Started by: Azucena Cecil, RN  Height: 5\' 9"  (1.753 m)  Cup Size: n/a  BMI:  Body mass index is 32.62 kg/(m^2). Weight:  221 lb (100.245 kg)   Tech Comments:  n/a    Nuclear Med Study 1 or 2 day study: 1 day  Stress Test Type:  Stress  Order Authorizing Provider:  Quay Burow, Md   Resting Radionuclide: Technetium 88m Sestamibi  Resting Radionuclide Dose: 10.4 mCi   Stress Radionuclide:  Technetium 15m Sestamibi  Stress Radionuclide Dose: 30.2 mCi           Stress Protocol Rest HR: 81 Stress HR: 166  Rest BP: 148/81 Stress BP: 198/77  Exercise Time (min): 8 METS: 10.1   Predicted Max HR: 153 bpm % Max HR: 108.5 bpm Rate Pressure Product: 32868  Dose of Adenosine (mg):  n/a Dose of Lexiscan: n/a mg  Dose of Atropine (mg): n/a Dose of Dobutamine: n/a mcg/kg/min (at max HR)  Stress Test Technologist: Leane Para, CCT Nuclear Technologist: Imagene Riches, CNMT   Rest Procedure:  Myocardial perfusion imaging was performed at rest 45 minutes following the intravenous administration of Technetium 67m Sestamibi. Stress Procedure:  The patient performed treadmill exercise using a Bruce  Protocol for 8 minutes. The patient stopped due  to SOB and Fatigue and denied any chest pain.  There were no significant ST-T wave changes.  Technetium 14m Sestamibi was injected at peak exercise and myocardial perfusion imaging was performed after a brief delay.  Transient Ischemic Dilatation (Normal <1.22):  0.83 Lung/Heart Ratio (Normal <0.45):  0.36 QGS EDV:  59 ml QGS ESV:  20 ml LV Ejection Fraction: 65%     Rest ECG: NSR, rare PVCs  Stress ECG: No significant change from baseline ECG  QPS Raw Data Images:  Normal; no motion artifact; normal heart/lung ratio. Stress Images:  There is decreased uptake in the inferior wall. Rest Images:  Comparison with the stress images reveals no significant change. Subtraction (SDS):  No evidence of ischemia.  Impression Exercise Capacity:  Good exercise capacity. BP Response:  Hypertensive blood pressure response. Clinical Symptoms:  No significant symptoms noted. ECG Impression:  No significant ST segment change suggestive of ischemia. Comparison with Prior Nuclear Study: No images to compare  Overall Impression:  Low risk stress nuclear study with basal inferior wall non transmural infarction..  LV Wall Motion:  Normal overall systolic function. Mild basal inferior wall hypokinesis.   Sanda Klein, MD  05/08/2013 5:52 PM

## 2013-05-27 ENCOUNTER — Encounter: Payer: Self-pay | Admitting: Cardiovascular Disease

## 2013-05-27 ENCOUNTER — Ambulatory Visit (INDEPENDENT_AMBULATORY_CARE_PROVIDER_SITE_OTHER): Payer: Medicare Other | Admitting: Cardiovascular Disease

## 2013-05-27 VITALS — BP 112/82 | HR 80 | Ht 69.0 in | Wt 223.6 lb

## 2013-05-27 DIAGNOSIS — I10 Essential (primary) hypertension: Secondary | ICD-10-CM

## 2013-05-27 DIAGNOSIS — E785 Hyperlipidemia, unspecified: Secondary | ICD-10-CM

## 2013-05-27 DIAGNOSIS — I2510 Atherosclerotic heart disease of native coronary artery without angina pectoris: Secondary | ICD-10-CM

## 2013-05-27 NOTE — Assessment & Plan Note (Signed)
Controlled on current medications 

## 2013-05-27 NOTE — Assessment & Plan Note (Signed)
On statin therapy. We'll recheck a lipid and liver profile 

## 2013-05-27 NOTE — Progress Notes (Signed)
05/27/2013 Steven Oneal   [birth year]  [id number]  Primary Physician Leonides Grills, MD Primary Cardiologist: Lorretta Harp MD [name]   HPI:  68 y/o male who has a history of CAD status post PCI and stenting of his RCA by Dr [name] Feb 2007 in the setting of an MI. He was restudied in June 2008, and had patent stents in his proximal RCA and noncritical disease in his proximal LAD with normal LV function. He has not had a Myoview since and has done well from a cardiac standpoint. He did have a PE/DVT in July 2014 and is on chronic Coumadin. He recently saw [name] after he had palpitations and chest pain Thanksgiving night. The pt says his heart was racing, he was sweaty, and he had pain across his chest. He told me "thought I was having a heart attack". [name] increased his Coreg and the pt stopped his Niaspan suspecting this had something to do with his symptoms, he had been on this since 2008. Since he stopped the Niaspan he has done well. He denies any further chest pain or palpations. His Holter showed PACs, PVCs, and sinus arrythmia with transient bradycardia and sinus pauses up to 2.7 seconds. He denies any syncope or pre syncope. He saw [name] back several weeks ago. A Myoview stress test performed 05/08/13 showed inferobasal scar with mild inferolateral hypokinesis unchanged from prior functional studies.    Current Outpatient Prescriptions  Medication Sig Dispense Refill  . amLODipine (NORVASC) 5 MG tablet Take 1 tablet (5 mg total) by mouth daily.  90 tablet  3  . aspirin 81 MG tablet Take 81 mg by mouth daily.        . carvedilol (COREG) 6.25 MG tablet Take 1 tablet (6.25 mg total) by mouth 2 (two) times daily.  180 tablet  3  . diazepam (VALIUM) 10 MG tablet Take 10 mg by mouth every 6 (six) hours as needed.        . fish oil-omega-3 fatty acids 1000 MG capsule Take 3 g by mouth daily.        Marland Kitchen losartan (COZAAR) 50 MG tablet Take 1 tablet (50 mg  total) by mouth daily.  30 tablet  6  . meclizine (ANTIVERT) 25 MG tablet Take 25 mg by mouth as needed.      Marland Kitchen omeprazole (PRILOSEC) 20 MG capsule Take 20 mg by mouth daily.        . pravastatin (PRAVACHOL) 40 MG tablet Take 1 tablet (40 mg total) by mouth daily.  30 tablet  9  . warfarin (COUMADIN) 5 MG tablet Take 5 mg by mouth as directed.       No current facility-administered medications for this visit.    Allergies  Allergen Reactions  . Codeine Itching, Nausea Only and Rash    History   Social History  . Marital Status: Married    Spouse Name: N/A    Number of Children: 2  . Years of Education: N/A   Occupational History  . disablity     CAD   Social History Main Topics  . Smoking status: Former Smoker -- 0.10 packs/day for 13 years    Types: Cigarettes    Quit date: 05/27/1977  . Smokeless tobacco: Never Used  . Alcohol Use: No  . Drug Use: No  . Sexual Activity: Yes    Partners: Female    Patent examiner Protection: None     Comment: spouse  Other Topics Concern  . Not on file   Social History Narrative  . No narrative on file     Review of Systems: General: negative for chills, fever, night sweats or weight changes.  Cardiovascular: negative for chest pain, dyspnea on exertion, edema, orthopnea, palpitations, paroxysmal nocturnal dyspnea or shortness of breath Dermatological: negative for rash Respiratory: negative for cough or wheezing Urologic: negative for hematuria Abdominal: negative for nausea, vomiting, diarrhea, bright red blood per rectum, melena, or hematemesis Neurologic: negative for visual changes, syncope, or dizziness All other systems reviewed and are otherwise negative except as noted above.    Blood pressure 112/82, pulse 80, height 5\' 9"  (1.753 m), weight 223 lb 9.6 oz (101.424 kg).  General appearance: alert and no distress Neck: no adenopathy, no carotid bruit, no JVD, supple, symmetrical, trachea midline and thyroid not  enlarged, symmetric, no tenderness/mass/nodules Lungs: clear to auscultation bilaterally Heart: regular rate and rhythm, S1, S2 normal, no murmur, click, rub or gallop Extremities: extremities normal, atraumatic, no cyanosis or edema  EKG not performed today  ASSESSMENT AND PLAN:   Coronary artery disease- RCA PCI, last cath 2007 Status post RCA stenting by Dr. [name] 06/15/05 with Cypher drug-eluting stents. I recast him 10/22/06 revealing a widely patent stent with scattered noncritical disease in the distal RCA and proximal LAD. Because of atypical chest pain he had a Myoview stress test performed 05/08/13 which was low risk with inferior wall nontransmural scar at the base unchanged from prior studies. He denies chest pain or shortness of breath.  Essential hypertension Controlled on current medications.  Hyperlipidemia On statin therapy. We'll recheck a lipid and liver profile      Lorretta Harp MD [facility], [facility] 05/27/2013 2:18 PM

## 2013-05-27 NOTE — Patient Instructions (Signed)
Your physician recommends that you schedule a follow-up appointment in: 6 months with [facility]  Your physician recommends that you schedule a follow-up appointment in: 12 months with Dr [name]  '

## 2013-05-27 NOTE — Assessment & Plan Note (Signed)
Status post RCA stenting by Dr. [name] 06/15/05 with Cypher drug-eluting stents. I recast him 10/22/06 revealing a widely patent stent with scattered noncritical disease in the distal RCA and proximal LAD. Because of atypical chest pain he had a Myoview stress test performed 05/08/13 which was low risk with inferior wall nontransmural scar at the base unchanged from prior studies. He denies chest pain or shortness of breath.

## 2013-06-09 LAB — LIPID PANEL
Cholesterol: 169 mg/dL (ref 0–200)
HDL: 45 mg/dL (ref >39)
LDL Cholesterol: 88 mg/dL (ref 0–99)
Total CHOL/HDL Ratio: 3.8 Ratio
Triglycerides: 180 mg/dL — ABNORMAL HIGH (ref <150)
VLDL: 36 mg/dL (ref 0–40)

## 2013-06-16 ENCOUNTER — Telehealth: Payer: Self-pay | Admitting: *Deleted

## 2013-06-16 ENCOUNTER — Other Ambulatory Visit: Payer: Self-pay | Admitting: *Deleted

## 2013-06-16 DIAGNOSIS — Z79899 Other long term (current) drug therapy: Secondary | ICD-10-CM

## 2013-06-16 DIAGNOSIS — E785 Hyperlipidemia, unspecified: Secondary | ICD-10-CM

## 2013-06-16 MED ORDER — PRAVASTATIN SODIUM 80 MG PO TABS: 80.0000 mg | ORAL_TABLET | Freq: Every day | ORAL | Status: DC

## 2013-06-16 NOTE — Telephone Encounter (Signed)
Message copied by Chauncy Lean on Mon Jun 16, 2013 12:23 PM ------      Message from: Lorretta Harp      Created: Thu Jun 12, 2013  3:29 PM       Unsure why FLP worse then before. Is he taking his statin? If so, increase Prav from 40 to 80 and re check ------

## 2013-06-16 NOTE — Telephone Encounter (Signed)
Order placed for repeat labs and RX sent to pharmacy

## 2013-07-01 ENCOUNTER — Other Ambulatory Visit (HOSPITAL_COMMUNITY): Payer: Self-pay | Admitting: Physician Assistant

## 2013-07-01 DIAGNOSIS — R1013 Epigastric pain: Secondary | ICD-10-CM

## 2013-07-07 ENCOUNTER — Ambulatory Visit (HOSPITAL_COMMUNITY)
Admission: RE | Admit: 2013-07-07 | Discharge: 2013-07-07 | Disposition: A | Discharge status: Home / Self Care | Payer: Medicare Other | Source: Ambulatory Visit | Attending: Physician Assistant | Admitting: Physician Assistant

## 2013-07-07 ENCOUNTER — Other Ambulatory Visit (HOSPITAL_COMMUNITY): Payer: Self-pay | Admitting: Physician Assistant

## 2013-07-07 DIAGNOSIS — K8020 Calculus of gallbladder without cholecystitis without obstruction: Secondary | ICD-10-CM | POA: Insufficient documentation

## 2013-07-07 DIAGNOSIS — R109 Unspecified abdominal pain: Secondary | ICD-10-CM | POA: Insufficient documentation

## 2013-07-07 DIAGNOSIS — R1013 Epigastric pain: Secondary | ICD-10-CM

## 2013-07-07 DIAGNOSIS — K7689 Other specified diseases of liver: Secondary | ICD-10-CM | POA: Insufficient documentation

## 2013-07-07 DIAGNOSIS — N281 Cyst of kidney, acquired: Secondary | ICD-10-CM | POA: Insufficient documentation

## 2013-07-11 ENCOUNTER — Encounter (HOSPITAL_COMMUNITY): Payer: Self-pay | Admitting: Pharmacy Technician

## 2013-07-11 ENCOUNTER — Encounter (HOSPITAL_COMMUNITY)
Admission: RE | Admit: 2013-07-11 | Discharge: 2013-07-11 | Disposition: A | Discharge status: Home / Self Care | Payer: Medicare Other | Source: Ambulatory Visit | Attending: General Surgery | Admitting: General Surgery

## 2013-07-11 ENCOUNTER — Encounter (HOSPITAL_COMMUNITY): Payer: Self-pay

## 2013-07-11 HISTORY — DX: Dizziness and giddiness: R42

## 2013-07-11 LAB — CBC WITH DIFFERENTIAL/PLATELET
Basophils Absolute: 0 10*3/uL (ref 0.0–0.1)
Basophils Relative: 0 % (ref 0–1)
EOS ABS: 0.2 10*3/uL (ref 0.0–0.7)
Eosinophils Relative: 2 % (ref 0–5)
HCT: 41.4 % (ref 39.0–52.0)
Hemoglobin: 14.7 g/dL (ref 13.0–17.0)
Lymphocytes Relative: 33 % (ref 12–46)
Lymphs Abs: 2.3 10*3/uL (ref 0.7–4.0)
MCH: 30.2 pg (ref 26.0–34.0)
MCHC: 35.5 g/dL (ref 30.0–36.0)
MCV: 85.2 fL (ref 78.0–100.0)
Monocytes Absolute: 0.6 10*3/uL (ref 0.1–1.0)
Monocytes Relative: 9 % (ref 3–12)
NEUTROS PCT: 56 % (ref 43–77)
Neutro Abs: 3.9 10*3/uL (ref 1.7–7.7)
PLATELETS: 188 10*3/uL (ref 150–400)
RBC: 4.86 MIL/uL (ref 4.22–5.81)
RDW: 13.2 % (ref 11.5–15.5)
WBC: 7.1 10*3/uL (ref 4.0–10.5)

## 2013-07-11 LAB — HEPATIC FUNCTION PANEL
ALBUMIN: 3.6 g/dL (ref 3.5–5.2)
ALT: 28 U/L (ref 0–53)
AST: 22 U/L (ref 0–37)
Alkaline Phosphatase: 47 U/L (ref 39–117)
BILIRUBIN TOTAL: 1 mg/dL (ref 0.3–1.2)
Bilirubin, Direct: <0.2 mg/dL (ref 0.0–0.3)
Total Protein: 6.6 g/dL (ref 6.0–8.3)

## 2013-07-11 LAB — BASIC METABOLIC PANEL
BUN: 8 mg/dL (ref 6–23)
CALCIUM: 9.2 mg/dL (ref 8.4–10.5)
CO2: 27 mEq/L (ref 19–32)
Chloride: 106 mEq/L (ref 96–112)
Creatinine, Ser: 0.61 mg/dL (ref 0.50–1.35)
GFR calc Af Amer: >90 mL/min (ref >90)
GLUCOSE: 84 mg/dL (ref 70–99)
Potassium: 3.9 mEq/L (ref 3.7–5.3)
Sodium: 144 mEq/L (ref 137–147)

## 2013-07-11 LAB — PROTIME-INR
INR: 1.74 — AB (ref 0.00–1.49)
PROTHROMBIN TIME: 19.8 s — AB (ref 11.6–15.2)

## 2013-07-11 NOTE — Patient Instructions (Signed)
Steven Oneal  07/11/2013   Your procedure is scheduled on:   07/14/2013  Report to [facility] at  900  AM.  Call this number if you have problems the morning of surgery: [phone]   Remember:   Do not eat food or drink liquids after midnight.   Take these medicines the morning of surgery with A SIP OF WATER:  Amlodipine, carvedilol, valium, losartan, antivert, prilosec   Do not wear jewelry, make-up or nail polish.  Do not wear lotions, powders, or perfumes.   Do not shave 48 hours prior to surgery. Men may shave face and neck.  Do not bring valuables to the hospital.  [facility] is not responsible for any belongings or valuables.               Contacts, dentures or bridgework may not be worn into surgery.  Leave suitcase in the car. After surgery it may be brought to your room.  For patients admitted to the hospital, discharge time is determined by your treatment team.               Patients discharged the day of surgery will not be allowed to drive home.  Name and phone number of your driver: family  Special Instructions: Shower using CHG 2 nights before surgery and the night before surgery.  If you shower the day of surgery use CHG.  Use special wash - you have one bottle of CHG for all showers.  You should use approximately 1/3 of the bottle for each shower.   Please read over the following fact sheets that you were given: Pain Booklet, Coughing and Deep Breathing, Surgical Site Infection Prevention, Anesthesia Post-op Instructions and Care and Recovery After Surgery Laparoscopic Cholecystectomy Laparoscopic cholecystectomy is surgery to remove the gallbladder. The gallbladder is located in the upper right part of the abdomen, behind the liver. It is a storage sac for bile produced in the liver. Bile aids in the digestion and absorption of fats. Cholecystectomy is often done for inflammation of the gallbladder (cholecystitis). This condition is usually caused by a  buildup of gallstones (cholelithiasis) in your gallbladder. Gallstones can block the flow of bile, resulting in inflammation and pain. In severe cases, emergency surgery may be required. When emergency surgery is not required, you will have time to prepare for the procedure. Laparoscopic surgery is an alternative to open surgery. Laparoscopic surgery has a shorter recovery time. Your common bile duct may also need to be examined during the procedure. If stones are found in the common bile duct, they may be removed. LET [facility] CARE PROVIDER KNOW ABOUT:  Any allergies you have.  All medicines you are taking, including vitamins, herbs, eye drops, creams, and over-the-counter medicines.  Previous problems you or members of your family have had with the use of anesthetics.  Any blood disorders you have.  Previous surgeries you have had.  Medical conditions you have. RISKS AND COMPLICATIONS Generally, this is a safe procedure. However, as with any procedure, complications can occur. Possible complications include:  Infection.  Damage to the common bile duct, nerves, arteries, veins, or other internal organs such as the stomach, liver, or intestines.  Bleeding.  A stone may remain in the common bile duct.  A bile leak from the cyst duct that is clipped when your gallbladder is removed.  The need to convert to open surgery, which requires a larger incision in the abdomen. This may be necessary  if your surgeon thinks it is not safe to continue with a laparoscopic procedure. BEFORE THE PROCEDURE  Ask your health care provider about changing or stopping any regular medicines. You will need to stop taking aspirin or blood thinners at least 5 days prior to surgery.  Do not eat or drink anything after midnight the night before surgery.  Let your health care provider know if you develop a cold or other infectious problem before surgery. PROCEDURE   You will be given medicine to make you  sleep through the procedure (general anesthetic). A breathing tube will be placed in your mouth.  When you are asleep, your surgeon will make several small cuts (incisions) in your abdomen.  A thin, lighted tube with a tiny camera on the end (laparoscope) is inserted through one of the small incisions. The camera on the laparoscope sends a picture to a TV screen in the operating room. This gives the surgeon a good view inside your abdomen.  A gas will be pumped into your abdomen. This expands your abdomen so that the surgeon has more room to perform the surgery.  Other tools needed for the procedure are inserted through the other incisions. The gallbladder is removed through one of the incisions.  After the removal of your gallbladder, the incisions will be closed with stitches, staples, or skin glue. AFTER THE PROCEDURE  You will be taken to a recovery area where your progress will be checked often.  You may be allowed to go home the same day if your pain is controlled and you can tolerate liquids. Document Released: 04/17/2005 Document Revised: 02/05/2013 Document Reviewed: 11/27/2012 [facility] Patient Information 2014 [place]. PATIENT INSTRUCTIONS POST-ANESTHESIA  IMMEDIATELY FOLLOWING SURGERY:  Do not drive or operate machinery for the first twenty four hours after surgery.  Do not make any important decisions for twenty four hours after surgery or while taking narcotic pain medications or sedatives.  If you develop intractable nausea and vomiting or a severe headache please notify your doctor immediately.  FOLLOW-UP:  Please make an appointment with your surgeon as instructed. You do not need to follow up with anesthesia unless specifically instructed to do so.  WOUND CARE INSTRUCTIONS (if applicable):  Keep a dry clean dressing on the anesthesia/puncture wound site if there is drainage.  Once the wound has quit draining you may leave it open to air.  Generally you should leave  the bandage intact for twenty four hours unless there is drainage.  If the epidural site drains for more than 36-48 hours please call the anesthesia department.  QUESTIONS?:  Please feel free to call your physician or the hospital operator if you have any questions, and they will be happy to assist you.

## 2013-07-11 NOTE — Pre-Procedure Instructions (Signed)
Patient given information to sign up for my chart at home. 

## 2013-07-11 NOTE — H&P (Signed)
  NTS SOAP Note  Vital Signs:  Vitals as of: 5/91/6384: Systolic 665: Diastolic 95: Heart Rate 92: Temp 97.47F: Height 19ft 9in: Weight 230Lbs 0 Ounces: Pain Level 5: BMI 33.96  BMI : 33.96 kg/m2  Subjective: This 68 Years 87 Months old Male presents for of right upper quadrant abdomina pain.  Has been present for several months.  Made worse with fatty food.  No fever, chills, jaundice.  U/S of gallbladder reveals choleltihiasis, normal common bile duct.  Review of Symptoms:  Constitutional:unremarkable   Head:unremarkable    Eyes:unremarkable   Nose/Mouth/Throat:unremarkable Cardiovascular:  unremarkable   Respiratory:unremarkable   Gastrointestin    abdominal pain Genitourinary:unremarkable     Musculoskeletal:unremarkable   Hematolgic/Lymphatic:unremarkable     Allergic/Immunologic:unremarkable     Past Medical History:    Reviewed  Past Medical History  Surgical History: right knee.  stent Medical Problems: GERD, Hypercholesterolemia, CAD, HTN, DVT with PE one year ago, atrial fib Psychiatric History: anxiety Allergies: codiene Medications: pravastatin, coumadin (which he is holding), niaspan, prilosec, valium, carvediol, cialis, losartan, ASA, amlodipine   Social History:Reviewed  Social History  Preferred Language: English Race:  White Ethnicity: Not Hispanic / Latino Age: 19 Years 0 Months Marital Status:  M Alcohol: none Recreational drug(s): none   Smoking Status: Never smoker reviewed on 07/10/2013 Functional Status reviewed on 07/10/2013 ------------------------------------------------ Bathing: Normal Cooking: Normal Dressing: Normal Driving: Normal Eating: Normal Managing Meds: Normal Oral Care: Normal Shopping: Normal Toileting: Normal Transferring: Normal Walking: Normal Cognitive Status reviewed on 07/10/2013 ------------------------------------------------ Attention: Normal Decision Making:  Normal Language: Normal Memory: Normal Motor: Normal Perception: Normal Problem Solving: Normal Visual and Spatial: Normal   Family History:  Reviewed  Family Health History Mother, Healthy;  Father, Healthy;  Other Family Member, Living; Healthy; noncontributory    Objective Information: General:  Well appearing, well nourished in no distress.   no scleral icterus Heart:  RRR, no murmur or gallop.  Normal S1, S2.  No S3, s4.. No atrial fib apprecicated. Abdomen:Soft, slightly tender in right upper quadrant to palpation, ND, normal bowel sounds, no HSM, no masses.  No peritoneal signs.  Assessment:Biliary colic, cholelitihiasis  Diagnoses: 574.20 Gallstone (Calculus of gallbladder without cholecystitis without obstruction)  Procedures: 99357 - OFFICE OUTPATIENT VISIT 25 MINUTES    Plan:  Scheduled for laparoscopic cholecystectomy on 07/14/13.  Should continue to hold coumadin, stop ASA.   Patient Education:Alternative treatments to surgery were discussed with patient (and family).  Risks and benefits  of procedure including bleeding, infection, hepatobiliary injury, and the possibility of an open procedure were fully explained to the patient (and family) who gave informed consent. Patient/family questions were addressed.  Follow-up:Pending Surgery

## 2013-07-14 ENCOUNTER — Ambulatory Visit (HOSPITAL_COMMUNITY): Payer: Medicare Other | Admitting: Anesthesiology

## 2013-07-14 ENCOUNTER — Encounter (HOSPITAL_COMMUNITY): Payer: Self-pay | Admitting: *Deleted

## 2013-07-14 ENCOUNTER — Encounter (HOSPITAL_COMMUNITY)
Admission: RE | Disposition: A | Discharge status: Home / Self Care | Payer: Self-pay | Source: Ambulatory Visit | Attending: General Surgery

## 2013-07-14 ENCOUNTER — Ambulatory Visit (HOSPITAL_COMMUNITY)
Admission: RE | Admit: 2013-07-14 | Discharge: 2013-07-14 | Disposition: A | Discharge status: Home / Self Care | Payer: Medicare Other | Source: Ambulatory Visit | Attending: General Surgery | Admitting: General Surgery

## 2013-07-14 ENCOUNTER — Encounter (HOSPITAL_COMMUNITY): Payer: Medicare Other | Admitting: Anesthesiology

## 2013-07-14 DIAGNOSIS — I252 Old myocardial infarction: Secondary | ICD-10-CM | POA: Insufficient documentation

## 2013-07-14 DIAGNOSIS — K8010 Calculus of gallbladder with chronic cholecystitis without obstruction: Secondary | ICD-10-CM | POA: Insufficient documentation

## 2013-07-14 DIAGNOSIS — K219 Gastro-esophageal reflux disease without esophagitis: Secondary | ICD-10-CM | POA: Insufficient documentation

## 2013-07-14 DIAGNOSIS — I4891 Unspecified atrial fibrillation: Secondary | ICD-10-CM | POA: Insufficient documentation

## 2013-07-14 DIAGNOSIS — I2510 Atherosclerotic heart disease of native coronary artery without angina pectoris: Secondary | ICD-10-CM | POA: Insufficient documentation

## 2013-07-14 DIAGNOSIS — E7800 Pure hypercholesterolemia, unspecified: Secondary | ICD-10-CM | POA: Insufficient documentation

## 2013-07-14 DIAGNOSIS — I739 Peripheral vascular disease, unspecified: Secondary | ICD-10-CM | POA: Insufficient documentation

## 2013-07-14 DIAGNOSIS — Z9861 Coronary angioplasty status: Secondary | ICD-10-CM | POA: Insufficient documentation

## 2013-07-14 DIAGNOSIS — F411 Generalized anxiety disorder: Secondary | ICD-10-CM | POA: Insufficient documentation

## 2013-07-14 DIAGNOSIS — I10 Essential (primary) hypertension: Secondary | ICD-10-CM | POA: Insufficient documentation

## 2013-07-14 DIAGNOSIS — Z01812 Encounter for preprocedural laboratory examination: Secondary | ICD-10-CM | POA: Insufficient documentation

## 2013-07-14 DIAGNOSIS — Z87891 Personal history of nicotine dependence: Secondary | ICD-10-CM | POA: Insufficient documentation

## 2013-07-14 DIAGNOSIS — Z86711 Personal history of pulmonary embolism: Secondary | ICD-10-CM | POA: Insufficient documentation

## 2013-07-14 DIAGNOSIS — Z86718 Personal history of other venous thrombosis and embolism: Secondary | ICD-10-CM | POA: Insufficient documentation

## 2013-07-14 HISTORY — PX: CHOLECYSTECTOMY: SHX55

## 2013-07-14 SURGERY — LAPAROSCOPIC CHOLECYSTECTOMY
Anesthesia: General

## 2013-07-14 MED ORDER — POVIDONE-IODINE 10 % OINT PACKET
TOPICAL_OINTMENT | CUTANEOUS | Status: DC | PRN
  Administered 2013-07-14: 1 via TOPICAL

## 2013-07-14 MED ORDER — MIDAZOLAM HCL 2 MG/2ML IJ SOLN
1.0000 mg | INTRAMUSCULAR | Status: DC | PRN
  Administered 2013-07-14: 2 mg via INTRAVENOUS

## 2013-07-14 MED ORDER — ONDANSETRON HCL 4 MG/2ML IJ SOLN
INTRAMUSCULAR | Status: AC
  Filled 2013-07-14: qty 2

## 2013-07-14 MED ORDER — LACTATED RINGERS IV SOLN
INTRAVENOUS | Status: DC
  Administered 2013-07-14 (×2): via INTRAVENOUS

## 2013-07-14 MED ORDER — SODIUM CHLORIDE 0.9 % IR SOLN
Status: DC | PRN
  Administered 2013-07-14: 1000 mL
  Administered 2013-07-14: 3000 mL

## 2013-07-14 MED ORDER — SUCCINYLCHOLINE CHLORIDE 20 MG/ML IJ SOLN
INTRAMUSCULAR | Status: AC
Start: 2013-07-14 — End: 2013-07-14
  Filled 2013-07-14: qty 1

## 2013-07-14 MED ORDER — GLYCOPYRROLATE 0.2 MG/ML IJ SOLN
INTRAMUSCULAR | Status: DC | PRN
  Administered 2013-07-14: 0.4 mg via INTRAVENOUS

## 2013-07-14 MED ORDER — MIDAZOLAM HCL 2 MG/2ML IJ SOLN
INTRAMUSCULAR | Status: AC
  Filled 2013-07-14: qty 2

## 2013-07-14 MED ORDER — ROCURONIUM BROMIDE 100 MG/10ML IV SOLN
INTRAVENOUS | Status: DC | PRN
  Administered 2013-07-14: 5 mg via INTRAVENOUS
  Administered 2013-07-14: 20 mg via INTRAVENOUS
  Administered 2013-07-14 (×3): 5 mg via INTRAVENOUS

## 2013-07-14 MED ORDER — NEOSTIGMINE METHYLSULFATE 1 MG/ML IJ SOLN
INTRAMUSCULAR | Status: DC | PRN
  Administered 2013-07-14: 2 mg via INTRAVENOUS

## 2013-07-14 MED ORDER — KETOROLAC TROMETHAMINE 30 MG/ML IJ SOLN
30.0000 mg | Freq: Once | INTRAMUSCULAR | Status: AC
  Administered 2013-07-14: 30 mg via INTRAVENOUS
  Filled 2013-07-14: qty 1

## 2013-07-14 MED ORDER — DEXTROSE 5 % IV SOLN
INTRAVENOUS | Status: AC
  Filled 2013-07-14: qty 2

## 2013-07-14 MED ORDER — HEMOSTATIC AGENTS (NO CHARGE) OPTIME
TOPICAL | Status: DC | PRN
  Administered 2013-07-14: 2 via TOPICAL

## 2013-07-14 MED ORDER — POVIDONE-IODINE 10 % EX OINT
TOPICAL_OINTMENT | CUTANEOUS | Status: AC
  Filled 2013-07-14: qty 1

## 2013-07-14 MED ORDER — ONDANSETRON HCL 4 MG/2ML IJ SOLN
4.0000 mg | Freq: Once | INTRAMUSCULAR | Status: AC
  Administered 2013-07-14: 4 mg via INTRAVENOUS

## 2013-07-14 MED ORDER — BUPIVACAINE HCL (PF) 0.5 % IJ SOLN
INTRAMUSCULAR | Status: AC
  Filled 2013-07-14: qty 30

## 2013-07-14 MED ORDER — GLYCOPYRROLATE 0.2 MG/ML IJ SOLN
INTRAMUSCULAR | Status: AC
  Filled 2013-07-14: qty 2

## 2013-07-14 MED ORDER — FENTANYL CITRATE 0.05 MG/ML IJ SOLN: 25.0000 ug | INTRAMUSCULAR | Status: DC | PRN

## 2013-07-14 MED ORDER — PROPOFOL 10 MG/ML IV BOLUS
INTRAVENOUS | Status: DC | PRN
  Administered 2013-07-14: 30 mg via INTRAVENOUS
  Administered 2013-07-14: 150 mg via INTRAVENOUS
  Administered 2013-07-14: 20 mg via INTRAVENOUS

## 2013-07-14 MED ORDER — FENTANYL CITRATE 0.05 MG/ML IJ SOLN
INTRAMUSCULAR | Status: AC
Start: 2013-07-14 — End: 2013-07-14
  Filled 2013-07-14: qty 5

## 2013-07-14 MED ORDER — CHLORHEXIDINE GLUCONATE 4 % EX LIQD: 1.0000 "application " | Freq: Once | CUTANEOUS | Status: DC

## 2013-07-14 MED ORDER — BUPIVACAINE HCL (PF) 0.5 % IJ SOLN
INTRAMUSCULAR | Status: DC | PRN
  Administered 2013-07-14: 10 mL

## 2013-07-14 MED ORDER — FENTANYL CITRATE 0.05 MG/ML IJ SOLN
INTRAMUSCULAR | Status: DC | PRN
  Administered 2013-07-14: 50 ug via INTRAVENOUS
  Administered 2013-07-14: 25 ug via INTRAVENOUS
  Administered 2013-07-14 (×2): 50 ug via INTRAVENOUS
  Administered 2013-07-14: 25 ug via INTRAVENOUS
  Administered 2013-07-14: 50 ug via INTRAVENOUS

## 2013-07-14 MED ORDER — OXYCODONE-ACETAMINOPHEN 7.5-325 MG PO TABS: 1.0000 | ORAL_TABLET | ORAL | Status: DC | PRN

## 2013-07-14 MED ORDER — ONDANSETRON HCL 4 MG/2ML IJ SOLN: 4.0000 mg | Freq: Once | INTRAMUSCULAR | Status: DC | PRN

## 2013-07-14 MED ORDER — DEXTROSE 5 % IV SOLN
2.0000 g | INTRAVENOUS | Status: AC
  Administered 2013-07-14: 2 g via INTRAVENOUS

## 2013-07-14 MED ORDER — SUCCINYLCHOLINE CHLORIDE 20 MG/ML IJ SOLN
INTRAMUSCULAR | Status: DC | PRN
  Administered 2013-07-14: 120 mg via INTRAVENOUS

## 2013-07-14 MED ORDER — LIDOCAINE HCL (CARDIAC) 10 MG/ML IV SOLN
INTRAVENOUS | Status: DC | PRN
  Administered 2013-07-14: 20 mg via INTRAVENOUS

## 2013-07-14 SURGICAL SUPPLY — 47 items
APPLIER CLIP LAPSCP 10X32 DD (CLIP) ×2 IMPLANT
BAG HAMPER (MISCELLANEOUS) ×2 IMPLANT
BAG SPEC RTRVL LRG 6X4 10 (ENDOMECHANICALS) ×2
CLOTH BEACON ORANGE TIMEOUT ST (SAFETY) ×2 IMPLANT
COVER LIGHT HANDLE STERIS (MISCELLANEOUS) ×4 IMPLANT
DECANTER SPIKE VIAL GLASS SM (MISCELLANEOUS) ×2 IMPLANT
DURAPREP 26ML APPLICATOR (WOUND CARE) ×2 IMPLANT
ELECT REM PT RETURN 9FT ADLT (ELECTROSURGICAL) ×2
ELECTRODE REM PT RTRN 9FT ADLT (ELECTROSURGICAL) ×1 IMPLANT
FILTER SMOKE EVAC LAPAROSHD (FILTER) ×2 IMPLANT
FORMALIN 10 PREFIL 120ML (MISCELLANEOUS) ×2 IMPLANT
GLOVE BIO SURGEON STRL SZ7.5 (GLOVE) ×2 IMPLANT
GLOVE BIOGEL PI IND STRL 7.0 (GLOVE) IMPLANT
GLOVE BIOGEL PI IND STRL 7.5 (GLOVE) IMPLANT
GLOVE BIOGEL PI IND STRL 8 (GLOVE) ×1 IMPLANT
GLOVE BIOGEL PI INDICATOR 7.0 (GLOVE) ×2
GLOVE BIOGEL PI INDICATOR 7.5 (GLOVE) ×1
GLOVE BIOGEL PI INDICATOR 8 (GLOVE) ×1
GLOVE ECLIPSE 6.5 STRL STRAW (GLOVE) IMPLANT
GLOVE ECLIPSE 7.0 STRL STRAW (GLOVE) ×1 IMPLANT
GLOVE SS BIOGEL STRL SZ 6.5 (GLOVE) IMPLANT
GLOVE SUPERSENSE BIOGEL SZ 6.5 (GLOVE) ×1
GOWN STRL REUS W/TWL LRG LVL3 (GOWN DISPOSABLE) ×6 IMPLANT
HEMOSTAT SNOW SURGICEL 2X4 (HEMOSTASIS) ×3 IMPLANT
INST SET LAPROSCOPIC AP (KITS) ×3 IMPLANT
IV NS IRRIG 3000ML ARTHROMATIC (IV SOLUTION) ×1 IMPLANT
KIT ROOM TURNOVER APOR (KITS) ×2 IMPLANT
MANIFOLD NEPTUNE II (INSTRUMENTS) ×2 IMPLANT
NDL INSUFFLATION 14GA 120MM (NEEDLE) ×1 IMPLANT
NEEDLE INSUFFLATION 14GA 120MM (NEEDLE) ×2 IMPLANT
NS IRRIG 1000ML POUR BTL (IV SOLUTION) ×2 IMPLANT
PACK LAP CHOLE LZT030E (CUSTOM PROCEDURE TRAY) ×2 IMPLANT
PAD ARMBOARD 7.5X6 YLW CONV (MISCELLANEOUS) ×2 IMPLANT
POUCH SPECIMEN RETRIEVAL 10MM (ENDOMECHANICALS) ×3 IMPLANT
SET BASIN LINEN APH (SET/KITS/TRAYS/PACK) ×2 IMPLANT
SET TUBE IRRIG SUCTION NO TIP (IRRIGATION / IRRIGATOR) ×1 IMPLANT
SLEEVE ENDOPATH XCEL 5M (ENDOMECHANICALS) ×2 IMPLANT
SPONGE GAUZE 2X2 8PLY STRL LF (GAUZE/BANDAGES/DRESSINGS) ×8 IMPLANT
STAPLER VISISTAT (STAPLE) ×2 IMPLANT
SUT VICRYL 0 UR6 27IN ABS (SUTURE) ×2 IMPLANT
TAPE CLOTH SURG 4X10 WHT LF (GAUZE/BANDAGES/DRESSINGS) ×1 IMPLANT
TROCAR ENDO BLADELESS 11MM (ENDOMECHANICALS) ×2 IMPLANT
TROCAR XCEL NON-BLD 5MMX100MML (ENDOMECHANICALS) ×2 IMPLANT
TROCAR XCEL UNIV SLVE 11M 100M (ENDOMECHANICALS) ×2 IMPLANT
TUBING INSUFFLATION (TUBING) ×2 IMPLANT
WARMER LAPAROSCOPE (MISCELLANEOUS) ×2 IMPLANT
YANKAUER SUCT 12FT TUBE ARGYLE (SUCTIONS) ×2 IMPLANT

## 2013-07-14 NOTE — Anesthesia Preprocedure Evaluation (Signed)
Anesthesia Evaluation  Patient identified by MRN, date of birth, ID band Patient awake    Reviewed: Allergy & Precautions, H&P , NPO status , Patient's Chart, lab work & pertinent test results  Airway Mallampati: II TM Distance: >3 FB     Dental  (+) Edentulous Upper, Partial Lower   Pulmonary former smoker,  breath sounds clear to auscultation        Cardiovascular hypertension, Pt. on medications + CAD, + Past MI, + Cardiac Stents, + Peripheral Vascular Disease and DVT Rhythm:Regular Rate:Normal     Neuro/Psych    GI/Hepatic GERD-  Medicated and Controlled,  Endo/Other    Renal/GU      Musculoskeletal   Abdominal   Peds  Hematology   Anesthesia Other Findings   Reproductive/Obstetrics                           Anesthesia Physical Anesthesia Plan  ASA: III  Anesthesia Plan: General   Post-op Pain Management:    Induction: Intravenous, Rapid sequence and Cricoid pressure planned  Airway Management Planned: Oral ETT  Additional Equipment:   Intra-op Plan:   Post-operative Plan: Extubation in OR  Informed Consent: I have reviewed the patients History and Physical, chart, labs and discussed the procedure including the risks, benefits and alternatives for the proposed anesthesia with the patient or authorized representative who has indicated his/her understanding and acceptance.     Plan Discussed with:   Anesthesia Plan Comments:         Anesthesia Quick Evaluation

## 2013-07-14 NOTE — Discharge Instructions (Signed)
Restart coumadin on Wednesday 07/16/13.  Get INR check next week by primary care physician who follows your coumadin.  Laparoscopic Cholecystectomy, Care After Refer to this sheet in the next few weeks. These instructions provide you with information on caring for yourself after your procedure. Your health care provider may also give you more specific instructions. Your treatment has been planned according to current medical practices, but problems sometimes occur. Call your health care provider if you have any problems or questions after your procedure. WHAT TO EXPECT AFTER THE PROCEDURE After your procedure, it is typical to have the following:  Pain at your incision sites. You will be given pain medicines to control the pain.  Mild nausea or vomiting. This should improve after the first 24 hours.  Bloating and possibly shoulder pain from the gas used during the procedure. This will improve after the first 24 hours. HOME CARE INSTRUCTIONS   Change bandages (dressings) as directed by your health care provider.  Keep the wound dry and clean. You may wash the wound gently with soap and water. Gently blot or dab the area dry.  Do not take baths or use swimming pools or hot tubs for 2 weeks or until your health care provider approves.  Only take over-the-counter or prescription medicines as directed by your health care provider.  Continue your normal diet as directed by your health care provider.  Do not lift anything heavier than 10 pounds (4.5 kg) until your health care provider approves.  Do not play contact sports for 1 week or until your health care provider approves. SEEK MEDICAL CARE IF:   You have redness, swelling, or increasing pain in the wound.  You notice yellowish-white fluid (pus) coming from the wound.  You have drainage from the wound that lasts longer than 1 day.  You notice a bad smell coming from the wound or dressing.  Your surgical cuts (incisions) break  open. SEEK IMMEDIATE MEDICAL CARE IF:   You develop a rash.  You have difficulty breathing.  You have chest pain.  You have a fever.  You have increasing pain in the shoulders (shoulder strap areas).  You have dizzy episodes or faint while standing.  You have severe abdominal pain.  You feel sick to your stomach (nauseous) or throw up (vomit) and this lasts for more than 1 day. Document Released: 04/17/2005 Document Revised: 02/05/2013 Document Reviewed: 11/27/2012 [facility] Patient Information 2014 [place].

## 2013-07-14 NOTE — Anesthesia Procedure Notes (Signed)
Procedure Name: Intubation Date/Time: 07/14/2013 9:53 AM Performed by: Vista Deck Pre-anesthesia Checklist: Patient identified, Patient being monitored, Timeout performed, Emergency Drugs available and Suction available Patient Re-evaluated:Patient Re-evaluated prior to inductionOxygen Delivery Method: Circle System Utilized Preoxygenation: Pre-oxygenation with 100% oxygen Intubation Type: IV induction, Rapid sequence and Cricoid Pressure applied Ventilation: Oral airway inserted - appropriate to patient size Laryngoscope Size: Sabra Heck and 2 Grade View: Grade I Tube type: Oral Tube size: 7.0 mm Number of attempts: 1 Airway Equipment and Method: stylet Placement Confirmation: ETT inserted through vocal cords under direct vision,  positive ETCO2 and breath sounds checked- equal and bilateral Secured at: 21 cm Tube secured with: Tape Dental Injury: Teeth and Oropharynx as per pre-operative assessment

## 2013-07-14 NOTE — Transfer of Care (Signed)
Immediate Anesthesia Transfer of Care Note  Patient: Steven Oneal  Procedure(s) Performed: Procedure(s) (LRB): LAPAROSCOPIC CHOLECYSTECTOMY (N/A)  Patient Location: PACU  Anesthesia Type: General  Level of Consciousness: awake  Airway & Oxygen Therapy: Patient Spontanous Breathing and non-rebreather face mask  Post-op Assessment: Report given to PACU RN, Post -op Vital signs reviewed and stable and Patient moving all extremities  Post vital signs: Reviewed and stable  Complications: No apparent anesthesia complications

## 2013-07-14 NOTE — Anesthesia Postprocedure Evaluation (Signed)
  Anesthesia Post-op Note  Patient: Steven Oneal  Procedure(s) Performed: Procedure(s): LAPAROSCOPIC CHOLECYSTECTOMY (N/A)  Patient Location: PACU  Anesthesia Type:General  Level of Consciousness: awake and patient cooperative  Airway and Oxygen Therapy: Patient Spontanous Breathing and non-rebreather face mask  Post-op Pain: mild  Post-op Assessment: Post-op Vital signs reviewed, Patient's Cardiovascular Status Stable, Respiratory Function Stable, Patent Airway and No signs of Nausea or vomiting  Post-op Vital Signs: Reviewed and stable  Complications: No apparent anesthesia complications

## 2013-07-14 NOTE — Interval H&P Note (Signed)
History and Physical Interval Note:  07/14/2013 9:32 AM  Steven Oneal  has presented today for surgery, with the diagnosis of cholelithiasis  The various methods of treatment have been discussed with the patient and family. After consideration of risks, benefits and other options for treatment, the patient has consented to  Procedure(s): LAPAROSCOPIC CHOLECYSTECTOMY (N/A) as a surgical intervention .  The patient's history has been reviewed, patient examined, no change in status, stable for surgery.  I have reviewed the patient's chart and labs.  Questions were answered to the patient's satisfaction.     Aviva Signs A

## 2013-07-14 NOTE — Op Note (Signed)
Patient:  Steven Oneal  DOB:  1946/02/16  MRN:  [id number]   Preop Diagnosis:  Cholecystitis, cholelithiasis  Postop Diagnosis:  Same  Procedure:  Laparoscopic cholecystectomy  Surgeon:  Aviva Signs, M.D.  Anes:  General endotracheal  Indications:  Patient is a 68 year old white male who presents with biliary colic secondary to cholelithiasis. This has been going on for some time, and now he presents for lap scopic cholecystectomy. He has held his Coumadin for 5 days. Risks and benefits of the procedure including bleeding, infection, hepatobiliary injury, the possibility of an open procedure were fully explained to the patient, who gave informed consent.  Procedure note:  The patient is placed the supine position. After induction of general endotracheal anesthesia, the abdomen was prepped and draped using usual sterile technique with DuraPrep. Surgical site confirmation was performed.  A supraumbilical incision was made down the fascia. A Veress was introduced into the abdominal cavity and confirmation of placement was done using the saline drop test. The abdomen was then insufflated to 16 mm mercury pressure. The level near trocar was introduced into the abdominal cavity under direct visualization without difficulty. The patient was placed in reverse Trendelenburg position and additional 11 mm trocar was placed the epigastric region and 5 mm trochars were placed in the right upper quadrant and right flank regions. Liver was inspected and noted to be within normal limits. The gallbladder was noted to be contracted, and gallstones. Multiple adhesions of omentum around the gallbladder and these were freed away bluntly and using Bovie electrocautery. A dome down approach was used in order to expose the triangle of Calot. The cystic artery was first identified. Endoclips were placed proximally distally on the cystic artery, and the cystic artery was divided. Continuing with a dome down approach,  the cystic duct was fully identified. Its junction to the infundibulum was fully identified. Endoclips were placed proximally and distally on the cystic duct, and the cystic duct was divided. The gallbladder was then delivered through the epigastric trocar site using an Endo Catch bag. Sent to pathology further examination. The gallbladder bed was copiously irrigated normal saline. Any bleeding was controlled using Bovie electrocautery. Surgicel is placed the gallbladder fossa. All fluid and air were then evacuated from the abdominal cavity prior to removal of the trochars.  All wounds were irrigated with normal saline. All wounds were injected with 0.5% Sensorcaine. The supraumbilical fascia as well as epigastric fascia were reapproximated using 0 Vicryl interrupted sutures. All skin incisions were closed using staples. Betadine ointment and dry sterile dressings were applied.  All tape and needle counts were correct at the end of the procedure. Patient was extubated in the operating room and transferred to PACU in stable condition.    Complications:  None  EBL:  Minimal  Specimen:  Gallbladder

## 2013-07-16 ENCOUNTER — Encounter (HOSPITAL_COMMUNITY): Payer: Self-pay | Admitting: General Surgery

## 2013-08-12 LAB — HEPATIC FUNCTION PANEL
ALBUMIN: 3.9 g/dL (ref 3.5–5.2)
ALT: 30 U/L (ref 0–53)
AST: 19 U/L (ref 0–37)
Alkaline Phosphatase: 45 U/L (ref 39–117)
BILIRUBIN INDIRECT: 0.6 mg/dL (ref 0.2–1.2)
Bilirubin, Direct: 0.2 mg/dL (ref 0.0–0.3)
TOTAL PROTEIN: 6.3 g/dL (ref 6.0–8.3)
Total Bilirubin: 0.8 mg/dL (ref 0.2–1.2)

## 2013-08-12 LAB — LIPID PANEL
CHOL/HDL RATIO: 3.4 ratio
CHOLESTEROL: 143 mg/dL (ref 0–200)
HDL: 42 mg/dL (ref >39)
LDL CALC: 67 mg/dL (ref 0–99)
TRIGLYCERIDES: 171 mg/dL — AB (ref <150)
VLDL: 34 mg/dL (ref 0–40)

## 2013-08-13 ENCOUNTER — Encounter: Payer: Self-pay | Admitting: *Deleted

## 2013-08-29 NOTE — Telephone Encounter (Signed)
na

## 2013-09-03 ENCOUNTER — Other Ambulatory Visit: Payer: Self-pay | Admitting: Cardiovascular Disease

## 2013-09-03 NOTE — Telephone Encounter (Signed)
Rx was sent to pharmacy electronically. 

## 2013-10-20 ENCOUNTER — Other Ambulatory Visit: Payer: Self-pay | Admitting: Cardiovascular Disease

## 2013-10-20 NOTE — Telephone Encounter (Signed)
Rx refill sent to patient pharmacy   

## 2013-11-18 ENCOUNTER — Other Ambulatory Visit: Payer: Self-pay | Admitting: *Deleted

## 2013-11-18 MED ORDER — LOSARTAN POTASSIUM 50 MG PO TABS: 50.0000 mg | ORAL_TABLET | Freq: Every day | ORAL | Status: DC

## 2013-11-18 MED ORDER — PRAVASTATIN SODIUM 80 MG PO TABS: 80.0000 mg | ORAL_TABLET | Freq: Every day | ORAL | Status: DC

## 2013-11-18 MED ORDER — CARVEDILOL 6.25 MG PO TABS: 6.2500 mg | ORAL_TABLET | Freq: Two times a day (BID) | ORAL | Status: DC

## 2013-11-18 NOTE — Telephone Encounter (Signed)
Rx refill sent to patient pharmacy   

## 2014-01-26 ENCOUNTER — Other Ambulatory Visit: Payer: Self-pay | Admitting: Cardiovascular Disease

## 2014-01-27 NOTE — Telephone Encounter (Signed)
Rx was sent to pharmacy electronically. 

## 2014-01-29 ENCOUNTER — Ambulatory Visit (INDEPENDENT_AMBULATORY_CARE_PROVIDER_SITE_OTHER): Payer: Medicare Other | Admitting: *Deleted

## 2014-01-29 DIAGNOSIS — Z23 Encounter for immunization: Secondary | ICD-10-CM

## 2014-02-02 NOTE — Progress Notes (Signed)
Patient here with wife and requested a flu shot.  Flu shot given

## 2014-05-20 DIAGNOSIS — Z7901 Long term (current) use of anticoagulants: Secondary | ICD-10-CM | POA: Diagnosis not present

## 2014-05-26 ENCOUNTER — Encounter: Payer: Self-pay | Admitting: Cardiovascular Disease

## 2014-05-26 ENCOUNTER — Ambulatory Visit (INDEPENDENT_AMBULATORY_CARE_PROVIDER_SITE_OTHER): Payer: Medicare Other | Admitting: Cardiovascular Disease

## 2014-05-26 VITALS — BP 138/92 | HR 97 | Ht 69.0 in | Wt 233.4 lb

## 2014-05-26 DIAGNOSIS — I2699 Other pulmonary embolism without acute cor pulmonale: Secondary | ICD-10-CM

## 2014-05-26 DIAGNOSIS — I10 Essential (primary) hypertension: Secondary | ICD-10-CM

## 2014-05-26 DIAGNOSIS — I2510 Atherosclerotic heart disease of native coronary artery without angina pectoris: Secondary | ICD-10-CM

## 2014-05-26 DIAGNOSIS — E785 Hyperlipidemia, unspecified: Secondary | ICD-10-CM | POA: Diagnosis not present

## 2014-05-26 DIAGNOSIS — Z79899 Other long term (current) drug therapy: Secondary | ICD-10-CM

## 2014-05-26 DIAGNOSIS — I2583 Coronary atherosclerosis due to lipid rich plaque: Secondary | ICD-10-CM

## 2014-05-26 NOTE — Assessment & Plan Note (Addendum)
History of remote PE/DVT in 7/14 on chronic Coumadin anticoagulation

## 2014-05-26 NOTE — Patient Instructions (Signed)
Your physician wants you to follow-up in: 1 year with Dr [name]. You will receive a reminder letter in the mail two months in advance. If you don't receive a letter, please call our office to schedule the follow-up appointment.   A FASTING lipid profile: to be done at your convenience.  There is a Psychologist, forensic lab on the first floor of this building, [address].  They are open from 8am-5pm with a lunch from 12-2.  You do not need an appointment.

## 2014-05-26 NOTE — Assessment & Plan Note (Signed)
History of hyperlipidemia on pravastatin 80 mg a day. His most recent lipid profile performed 08/12/13 revealed a total cholesterol 143, LDL 67 HDL 42. We will recheck a lipid and liver profile

## 2014-05-26 NOTE — Assessment & Plan Note (Signed)
History of hypertension with blood pressure measured 138/92. He is on amlodipine, carvedilol, losartan. He has gained 10 pounds in the last year. Continue current meds at current dosing

## 2014-05-26 NOTE — Progress Notes (Signed)
05/26/2014 Steven Oneal   [birth year]  [id number]  Primary Physician Steven Oneal., MD Primary Cardiologist: Steven Harp MD Steven Oneal   HPI:  69 y/o male who has I last saw one year ago who has  a history of CAD status post PCI and stenting of his RCA by Dr Steven Oneal Feb 2007 in the setting of an MI. He was restudied in June 2008, and had patent stents in his proximal RCA and noncritical disease in his proximal LAD with normal LV function. He has not had a Myoview since and has done well from a cardiac standpoint. He did have a PE/DVT in July 2014 and is on chronic Coumadin. He recently saw Steven Oneal after he had palpitations and chest pain Thanksgiving night. The pt says his heart was racing, he was sweaty, and he had pain across his chest. He told me "thought I was having a heart attack". Steven Oneal increased his Coreg and the pt stopped his Niaspan suspecting this had something to do with his symptoms, he had been on this since 2008. Since he stopped the Niaspan he has done well. He denies any further chest pain or palpations. His Holter showed PACs, PVCs, and sinus arrythmia with transient bradycardia and sinus pauses up to 2.7 seconds.  A Myoview stress test performed 05/08/13 showed inferobasal scar with mild inferolateral hypokinesis unchanged from prior functional studies. Since I saw him a year ago he denies chest pain or shortness of breath.   Current Outpatient Prescriptions  Medication Sig Dispense Refill  . amLODipine (NORVASC) 5 MG tablet TAKE ONE TABLET BY MOUTH ONCE DAILY 90 tablet 1  . aspirin 81 MG tablet Take 81 mg by mouth daily.      . carvedilol (COREG) 6.25 MG tablet Take 1 tablet (6.25 mg total) by mouth 2 (two) times daily. 180 tablet 3  . carvedilol (COREG) 6.25 MG tablet Take 1 tablet (6.25 mg total) by mouth 2 (two) times daily with a meal. 180 tablet 2  . diazepam (VALIUM) 10 MG tablet Take 10 mg by mouth every 6 (six) hours as needed for anxiety.      . fish oil-omega-3 fatty acids 1000 MG capsule Take 3 g by mouth daily.      Steven Oneal Kitchen losartan (COZAAR) 50 MG tablet Take 1 tablet (50 mg total) by mouth daily. 90 tablet 2  . meclizine (ANTIVERT) 25 MG tablet Take 25 mg by mouth as needed for dizziness.     . Multiple Vitamin (MULTIVITAMIN WITH MINERALS) TABS tablet Take 1 tablet by mouth daily.    Steven Oneal Kitchen omeprazole (PRILOSEC) 20 MG capsule Take 20 mg by mouth daily.      Steven Oneal Kitchen oxyCODONE-acetaminophen (PERCOCET) 7.5-325 MG per tablet Take 1-2 tablets by mouth every 4 (four) hours as needed. 50 tablet 0  . pravastatin (PRAVACHOL) 80 MG tablet Take 1 tablet (80 mg total) by mouth daily. 90 tablet 2  . warfarin (COUMADIN) 4 MG tablet Take 1 tablet by mouth as directed. Per coumadin clinic - Monday through Friday    . warfarin (COUMADIN) 5 MG tablet Take 5 mg by mouth as directed. Per coumadin clinic - Saturday and Sunday     No current facility-administered medications for this visit.    Allergies  Allergen Reactions  . Codeine Itching, Nausea Only and Rash    History   Social History  . Marital Status: Married    Spouse Name: N/A    Number of Children: 2  .  Years of Education: N/A   Occupational History  . disablity     CAD   Social History Main Topics  . Smoking status: Former Smoker -- 0.10 packs/day for 13 years    Types: Cigarettes    Quit date: 05/27/1977  . Smokeless tobacco: Never Used  . Alcohol Use: No  . Drug Use: No  . Sexual Activity:    Partners: Female    Museum/gallery curator: None     Comment: spouse   Other Topics Concern  . Not on file   Social History Narrative     Review of Systems: General: negative for chills, fever, night sweats or weight changes.  Cardiovascular: negative for chest pain, dyspnea on exertion, edema, orthopnea, palpitations, paroxysmal nocturnal dyspnea or shortness of breath Dermatological: negative for rash Respiratory: negative for cough or wheezing Urologic: negative for  hematuria Abdominal: negative for nausea, vomiting, diarrhea, bright red blood per rectum, melena, or hematemesis Neurologic: negative for visual changes, syncope, or dizziness All other systems reviewed and are otherwise negative except as noted above.    Blood pressure 138/92, pulse 97, height 5\' 9"  (1.753 m), weight 233 lb 6.4 oz (105.87 kg).  General appearance: alert and no distress Neck: no adenopathy, no carotid bruit, no JVD, supple, symmetrical, trachea midline and thyroid not enlarged, symmetric, no tenderness/mass/nodules Lungs: clear to auscultation bilaterally Heart: regular rate and rhythm, S1, S2 normal, no murmur, click, rub or gallop Extremities: extremities normal, atraumatic, no cyanosis or edema  EKG normal/97 with nonspecific ST-T wave changes. I personally reviewed this EKG  ASSESSMENT AND PLAN:   Pulmonary embolus History of remote PE/DVT in 7/14 on chronic Coumadin anticoagulation   Hyperlipidemia History of hyperlipidemia on pravastatin 80 mg a day. His most recent lipid profile performed 08/12/13 revealed a total cholesterol 143, LDL 67 HDL 42. We will recheck a lipid and liver profile   Essential hypertension History of hypertension with blood pressure measured 138/92. He is on amlodipine, carvedilol, losartan. He has gained 10 pounds in the last year. Continue current meds at current dosing   Coronary artery disease- RCA PCI, last cath 2007 History of CAD status post PCI and stenting of his RCA February 2007 in the setting of a myocardial infarction. He was restudied June 2008 and had patent stents in his proximal RCA and noncritical disease of the LAD with normal LV function. He denies chest pain or shortness of breath.       Steven Harp MD FACP,FACC,FAHA, [facility] 05/26/2014 4:27 PM

## 2014-05-26 NOTE — Assessment & Plan Note (Signed)
History of CAD status post PCI and stenting of his RCA February 2007 in the setting of a myocardial infarction. He was restudied June 2008 and had patent stents in his proximal RCA and noncritical disease of the LAD with normal LV function. He denies chest pain or shortness of breath.

## 2014-05-27 ENCOUNTER — Other Ambulatory Visit: Payer: Self-pay | Admitting: Cardiovascular Disease

## 2014-05-27 DIAGNOSIS — Z79899 Other long term (current) drug therapy: Secondary | ICD-10-CM | POA: Diagnosis not present

## 2014-05-27 DIAGNOSIS — E785 Hyperlipidemia, unspecified: Secondary | ICD-10-CM | POA: Diagnosis not present

## 2014-05-28 LAB — HEPATIC FUNCTION PANEL
ALT: 26 IU/L (ref 0–44)
AST: 19 IU/L (ref 0–40)
Albumin: 3.9 g/dL (ref 3.6–4.8)
Alkaline Phosphatase: 44 IU/L (ref 39–117)
Bilirubin, Direct: 0.18 mg/dL (ref 0.00–0.40)
Total Bilirubin: 0.7 mg/dL (ref 0.0–1.2)
Total Protein: 6 g/dL (ref 6.0–8.5)

## 2014-05-28 LAB — LIPID PANEL W/O CHOL/HDL RATIO
CHOLESTEROL TOTAL: 141 mg/dL (ref 100–199)
HDL: 37 mg/dL — ABNORMAL LOW (ref >39)
LDL Calculated: 67 mg/dL (ref 0–99)
TRIGLYCERIDES: 183 mg/dL — AB (ref 0–149)
VLDL CHOLESTEROL CAL: 37 mg/dL (ref 5–40)

## 2014-06-01 ENCOUNTER — Encounter: Payer: Self-pay | Admitting: *Deleted

## 2014-06-02 ENCOUNTER — Encounter: Payer: Self-pay | Admitting: Cardiovascular Disease

## 2014-06-05 ENCOUNTER — Telehealth: Payer: Self-pay | Admitting: Cardiovascular Disease

## 2014-06-05 NOTE — Telephone Encounter (Signed)
Patient states he was here last week and had blood work done.  Has Dr. [name] reviewed those results?

## 2014-06-05 NOTE — Telephone Encounter (Signed)
Reviewed results w patient, he expressed understanding.

## 2014-06-10 ENCOUNTER — Observation Stay (HOSPITAL_COMMUNITY)
Admission: EM | Admit: 2014-06-10 | Discharge: 2014-06-12 | Disposition: A | Discharge status: Home / Self Care | Payer: Medicare Other | Attending: Cardiology | Admitting: Cardiology

## 2014-06-10 ENCOUNTER — Emergency Department (HOSPITAL_COMMUNITY): Payer: Medicare Other

## 2014-06-10 ENCOUNTER — Encounter (HOSPITAL_COMMUNITY): Payer: Self-pay | Admitting: Emergency Medicine

## 2014-06-10 ENCOUNTER — Telehealth: Payer: Self-pay | Admitting: Cardiovascular Disease

## 2014-06-10 DIAGNOSIS — I25110 Atherosclerotic heart disease of native coronary artery with unstable angina pectoris: Principal | ICD-10-CM | POA: Insufficient documentation

## 2014-06-10 DIAGNOSIS — Z955 Presence of coronary angioplasty implant and graft: Secondary | ICD-10-CM | POA: Insufficient documentation

## 2014-06-10 DIAGNOSIS — I252 Old myocardial infarction: Secondary | ICD-10-CM | POA: Insufficient documentation

## 2014-06-10 DIAGNOSIS — Z7901 Long term (current) use of anticoagulants: Secondary | ICD-10-CM | POA: Diagnosis not present

## 2014-06-10 DIAGNOSIS — Z86711 Personal history of pulmonary embolism: Secondary | ICD-10-CM | POA: Diagnosis present

## 2014-06-10 DIAGNOSIS — K219 Gastro-esophageal reflux disease without esophagitis: Secondary | ICD-10-CM | POA: Insufficient documentation

## 2014-06-10 DIAGNOSIS — I209 Angina pectoris, unspecified: Secondary | ICD-10-CM | POA: Diagnosis present

## 2014-06-10 DIAGNOSIS — Z9861 Coronary angioplasty status: Secondary | ICD-10-CM

## 2014-06-10 DIAGNOSIS — R079 Chest pain, unspecified: Secondary | ICD-10-CM | POA: Diagnosis not present

## 2014-06-10 DIAGNOSIS — E876 Hypokalemia: Secondary | ICD-10-CM | POA: Diagnosis not present

## 2014-06-10 DIAGNOSIS — Z87891 Personal history of nicotine dependence: Secondary | ICD-10-CM | POA: Insufficient documentation

## 2014-06-10 DIAGNOSIS — R0602 Shortness of breath: Secondary | ICD-10-CM | POA: Diagnosis not present

## 2014-06-10 DIAGNOSIS — I10 Essential (primary) hypertension: Secondary | ICD-10-CM | POA: Diagnosis present

## 2014-06-10 DIAGNOSIS — J9811 Atelectasis: Secondary | ICD-10-CM | POA: Diagnosis not present

## 2014-06-10 DIAGNOSIS — R0789 Other chest pain: Secondary | ICD-10-CM | POA: Diagnosis not present

## 2014-06-10 DIAGNOSIS — Z86718 Personal history of other venous thrombosis and embolism: Secondary | ICD-10-CM | POA: Diagnosis not present

## 2014-06-10 DIAGNOSIS — E785 Hyperlipidemia, unspecified: Secondary | ICD-10-CM | POA: Diagnosis not present

## 2014-06-10 DIAGNOSIS — I2699 Other pulmonary embolism without acute cor pulmonale: Secondary | ICD-10-CM | POA: Diagnosis not present

## 2014-06-10 DIAGNOSIS — I2510 Atherosclerotic heart disease of native coronary artery without angina pectoris: Secondary | ICD-10-CM

## 2014-06-10 HISTORY — DX: Personal history of pulmonary embolism: Z86.711

## 2014-06-10 HISTORY — DX: Essential (primary) hypertension: I10

## 2014-06-10 HISTORY — DX: Gastro-esophageal reflux disease without esophagitis: K21.9

## 2014-06-10 HISTORY — DX: Personal history of other venous thrombosis and embolism: Z86.718

## 2014-06-10 LAB — COMPREHENSIVE METABOLIC PANEL
ALBUMIN: 3.7 g/dL (ref 3.5–5.2)
ALK PHOS: 39 U/L (ref 39–117)
ALT: 32 U/L (ref 0–53)
ANION GAP: 6 (ref 5–15)
AST: 25 U/L (ref 0–37)
BILIRUBIN TOTAL: 1 mg/dL (ref 0.3–1.2)
BUN: 8 mg/dL (ref 6–23)
CHLORIDE: 107 mmol/L (ref 96–112)
CO2: 26 mmol/L (ref 19–32)
CREATININE: 0.61 mg/dL (ref 0.50–1.35)
Calcium: 8.7 mg/dL (ref 8.4–10.5)
GLUCOSE: 103 mg/dL — AB (ref 70–99)
Potassium: 3.8 mmol/L (ref 3.5–5.1)
Sodium: 139 mmol/L (ref 135–145)
Total Protein: 6.5 g/dL (ref 6.0–8.3)

## 2014-06-10 LAB — CBC
HEMATOCRIT: 40.6 % (ref 39.0–52.0)
Hemoglobin: 14 g/dL (ref 13.0–17.0)
MCH: 29.7 pg (ref 26.0–34.0)
MCHC: 34.5 g/dL (ref 30.0–36.0)
MCV: 86 fL (ref 78.0–100.0)
PLATELETS: 200 10*3/uL (ref 150–400)
RBC: 4.72 MIL/uL (ref 4.22–5.81)
RDW: 13.4 % (ref 11.5–15.5)
WBC: 8.7 10*3/uL (ref 4.0–10.5)

## 2014-06-10 LAB — PROTIME-INR
INR: 2.2 — ABNORMAL HIGH (ref 0.00–1.49)
PROTHROMBIN TIME: 24.6 s — AB (ref 11.6–15.2)

## 2014-06-10 LAB — TROPONIN I
Troponin I: <0.03 ng/mL (ref <0.031)
Troponin I: <0.03 ng/mL (ref <0.031)

## 2014-06-10 MED ORDER — MORPHINE SULFATE 4 MG/ML IJ SOLN
4.0000 mg | Freq: Once | INTRAMUSCULAR | Status: AC
  Administered 2014-06-10: 4 mg via INTRAVENOUS
  Filled 2014-06-10: qty 1

## 2014-06-10 MED ORDER — NITROGLYCERIN 2 % TD OINT
1.0000 [in_us] | TOPICAL_OINTMENT | Freq: Four times a day (QID) | TRANSDERMAL | Status: DC
  Administered 2014-06-10 – 2014-06-12 (×6): 1 [in_us] via TOPICAL
  Filled 2014-06-10 (×6): qty 1

## 2014-06-10 MED ORDER — IOHEXOL 350 MG/ML SOLN
100.0000 mL | Freq: Once | INTRAVENOUS | Status: AC | PRN
  Administered 2014-06-10: 100 mL via INTRAVENOUS

## 2014-06-10 MED ORDER — NITROGLYCERIN 0.4 MG SL SUBL
0.4000 mg | SUBLINGUAL_TABLET | Freq: Once | SUBLINGUAL | Status: AC
  Administered 2014-06-10: 0.4 mg via SUBLINGUAL
  Filled 2014-06-10: qty 1

## 2014-06-10 MED ORDER — NITROGLYCERIN IN D5W 200-5 MCG/ML-% IV SOLN
5.0000 ug/min | INTRAVENOUS | Status: DC
  Administered 2014-06-10: 5 ug/min via INTRAVENOUS
  Filled 2014-06-10: qty 250

## 2014-06-10 NOTE — H&P (Signed)
PCP:   Glo Herring., MD   Chief Complaint:  Chest pain  HPI: 69 year old male who    has a past medical history of Allergic rhinitis; Acid reflux; Hypertension; Hyperlipidemia; DVT of leg (deep venous thrombosis); Pulmonary embolus; On continuous oral anticoagulation; History of MI (myocardial infarction) (2007); CAD (coronary artery disease); Myocardial infarction (2007); and Vertigo. Today came to the ED with chief complaint of chest pain which started 2 days ago. Patient says that the pain radiates to the right arm, described it as severe sharp pain which starts early in the morning and is off into pressure. Also noticed worsening shortness of breath along with chest pain. He also complains of lightheadedness and dizziness but denies passing out. Patient has a history of DVT and PE and is currently on Coumadin. He also underwent CT angiogram of the chest in the ED which was negative for pulmonary embolism. He denies nausea vomiting or diarrhea. No fever dysuria urgency or frequency of urination. Patient has a history of CAD and status post 2 coronary stents placed in 2007.  Allergies:   Allergies  Allergen Reactions  . Codeine Itching, Nausea Only and Rash      Past Medical History  Diagnosis Date  . Allergic rhinitis   . Acid reflux   . Hypertension   . Hyperlipidemia   . DVT of leg (deep venous thrombosis)   . Pulmonary embolus   . On continuous oral anticoagulation   . History of MI (myocardial infarction) 2007  . CAD (coronary artery disease)     stents in 2007 2 stents DES cypher to RCA  . Myocardial infarction 2007  . Vertigo     Past Surgical History  Procedure Laterality Date  . Appendectomy    . Colonoscopy  11/2003    single anal papilla and int hemorrhoids, pancolonic tics  . Coronary angioplasty with stent placement  06/15/2005    cypher stent to mid RCA; cypher stent -ostial RCA, RESIDUAL disease LAD  . Cardiac catheterization  10/09/2005    patent  stents, medical therapy  . Cardiac catheterization  10/22/2006    non obstructive disease, 60% LAD, patent RCA stents  . Cholecystectomy N/A 07/14/2013    Procedure: LAPAROSCOPIC CHOLECYSTECTOMY;  Surgeon: Jamesetta So, MD;  Location: [facility];  Service: General;  Laterality: N/A;    Prior to Admission medications   Medication Sig Start Date End Date Taking? Authorizing Provider  amLODipine (NORVASC) 5 MG tablet TAKE ONE TABLET BY MOUTH ONCE DAILY 01/27/14  Yes Lorretta Harp, MD  aspirin EC 81 MG tablet Take 81 mg by mouth daily.   Yes Historical Provider, MD  carvedilol (COREG) 6.25 MG tablet Take 1 tablet (6.25 mg total) by mouth 2 (two) times daily. 04/29/13  Yes Erlene Quan, PA-C  carvedilol (COREG) 6.25 MG tablet Take 1 tablet (6.25 mg total) by mouth 2 (two) times daily with a meal. 11/18/13  Yes Lorretta Harp, MD  diazepam (VALIUM) 10 MG tablet Take 10 mg by mouth every 6 (six) hours as needed for anxiety.    Yes Historical Provider, MD  fish oil-omega-3 fatty acids 1000 MG capsule Take 3 g by mouth daily.     Yes Historical Provider, MD  losartan (COZAAR) 50 MG tablet Take 1 tablet (50 mg total) by mouth daily. 11/18/13  Yes Lorretta Harp, MD  Multiple Vitamin (MULTIVITAMIN WITH MINERALS) TABS tablet Take 1 tablet by mouth daily.   Yes Historical Provider, MD  omeprazole (  PRILOSEC) 20 MG capsule Take 20 mg by mouth daily.     Yes Historical Provider, MD  pravastatin (PRAVACHOL) 80 MG tablet Take 1 tablet (80 mg total) by mouth daily. 11/18/13  Yes Lorretta Harp, MD  warfarin (COUMADIN) 4 MG tablet Take 1 tablet by mouth as directed. Per coumadin clinic - Monday through Friday 04/09/14  Yes Historical Provider, MD  meclizine (ANTIVERT) 25 MG tablet Take 25 mg by mouth as needed for dizziness.  05/13/13   Historical Provider, MD  oxyCODONE-acetaminophen (PERCOCET) 7.5-325 MG per tablet Take 1-2 tablets by mouth every 4 (four) hours as needed. Patient not taking: Reported on  06/10/2014 07/14/13   Jamesetta So, MD  warfarin (COUMADIN) 5 MG tablet Take 5 mg by mouth as directed. Per coumadin clinic - Saturday and Sunday    Historical Provider, MD    Social History:  reports that he quit smoking about 37 years ago. His smoking use included Cigarettes. He has a 1.3 pack-year smoking history. He has never used smokeless tobacco. He reports that he does not drink alcohol or use illicit drugs.  Family History  Problem Relation Age of Onset  . Colon cancer Mother 18  . Colon cancer Paternal Uncle 14  . Colon cancer Brother 67    rectal cancer  . Colon cancer Paternal Uncle 3  . Throat cancer Brother 43  . Prostate cancer Brother   . Pulmonary embolism Brother     with cancer     All the positives are listed in BOLD  Review of Systems:  HEENT: Headache, blurred vision, runny nose, sore throat Neck: Hypothyroidism, hyperthyroidism,,lymphadenopathy Chest : Shortness of breath, history of COPD, Asthma Heart : Chest pain, history of coronary arterey disease GI:  Nausea, vomiting, diarrhea, constipation, GERD GU: Dysuria, urgency, frequency of urination, hematuria Neuro: Stroke, seizures, syncope Psych: Depression, anxiety, hallucinations   Physical Exam: Blood pressure 124/78, pulse 79, temperature 97.6 F (36.4 C), temperature source Oral, resp. rate 15, height 5\' 9"  (1.753 m), weight 105.688 kg (233 lb), SpO2 93 %. Constitutional:   Patient is a well-developed and well-nourished *male in no acute distress and cooperative with exam. Head: Normocephalic and atraumatic Mouth: Mucus membranes moist Eyes: PERRL, EOMI, conjunctivae normal Neck: Supple, No Thyromegaly Cardiovascular: RRR, S1 normal, S2 normal Pulmonary/Chest: CTAB, no wheezes, rales, or rhonchi Abdominal: Soft. Non-tender, non-distended, bowel sounds are normal, no masses, organomegaly, or guarding present.  Neurological: A&O x3, Strength is normal and symmetric bilaterally, cranial nerve  II-XII are grossly intact, no focal motor deficit, sensory intact to light touch bilaterally.  Extremities : No Cyanosis, Clubbing or Edema  Labs on Admission:  Basic Metabolic Panel:  Recent Labs Lab 06/10/14 1831  NA 139  K 3.8  CL 107  CO2 26  GLUCOSE 103*  BUN 8  CREATININE 0.61  CALCIUM 8.7   Liver Function Tests:  Recent Labs Lab 06/10/14 1831  AST 25  ALT 32  ALKPHOS 39  BILITOT 1.0  PROT 6.5  ALBUMIN 3.7   No results for input(s): LIPASE, AMYLASE in the last 168 hours. No results for input(s): AMMONIA in the last 168 hours. CBC:  Recent Labs Lab 06/10/14 1831  WBC 8.7  HGB 14.0  HCT 40.6  MCV 86.0  PLT 200   Cardiac Enzymes:  Recent Labs Lab 06/10/14 1831 06/10/14 2210  TROPONINI <0.03 <0.03     Radiological Exams on Admission: Dg Chest 2 View  06/10/2014   CLINICAL DATA:  Chest pain.  Occasional shortness of breath.  EXAM: CHEST  2 VIEW  COMPARISON:  02/21/2010  FINDINGS: Heart size and vascularity are normal and the lungs are clear. No effusions. Chronic accentuation of the thoracic kyphosis.  IMPRESSION: No acute abnormalities.   Electronically Signed   By: Lorriane Shire M.D.   On: 06/10/2014 17:32   Ct Angio Chest Pe W/cm &/or Wo Cm  06/10/2014   CLINICAL DATA:  Chest pain for 2 days which radiates into the right arm.  EXAM: CT ANGIOGRAPHY CHEST WITH CONTRAST  TECHNIQUE: Multidetector CT imaging of the chest was performed using the standard protocol during bolus administration of intravenous contrast. Multiplanar CT image reconstructions and MIPs were obtained to evaluate the vascular anatomy.  CONTRAST:  170mL OMNIPAQUE IOHEXOL 350 MG/ML SOLN  COMPARISON:  CT thorax 11/08/2012  FINDINGS: Mediastinum/Nodes: No axillary or supraclavicular lymphadenopathy. No mediastinal hilar lymphadenopathy. No pericardial fluid.  There are no filling defects within the pulmonary arteries to suggest acute pulmonary embolism. No acute findings of the aorta or  great vessels. Esophagus is normal.  Lungs/Pleura: No pulmonary infarction. Mild subpleural atelectasis of the right lung base. No pleural fluid or pulmonary edema.  Upper abdomen: Limited view of the liver, kidneys, pancreas are unremarkable. Normal adrenal glands.  Musculoskeletal: Degenerate spurring of the spine.  Review of the MIP images confirms the above findings.  IMPRESSION: 1. No acute pulmonary embolism. 2. Mild right subpleural atelectasis   Electronically Signed   By: Suzy Bouchard M.D.   On: 06/10/2014 20:48    EKG: Independently reviewed. Sinus rhythm   Assessment/Plan Principal Problem:   Chest pain Active Problems:   Pulmonary embolus   Hyperlipidemia   Chronic anticoagulation for PE, DVT  Chest pain Patient has significant history of CAD, pulmonary embolism. CT angiogram of the chest was negative for PE. Cardiac enzymes 2 have been negative in the ED patient initially was started on nitroglycerin drip and acknowledges no transition to Nitropaste 1 inch every 6 hours. Cardiology was consulted at the [facility], and there recommended to admit the patient at [facility] hospital and consult cardiology in a.m. Will continue with aspirin, Coreg, Pravachol.  History of pulmonary embolism Continue Coumadin per pharmacy consultation.  Hyperlipidemia Continue Pravachol.  Code status: Full code  Family discussion: Admission, patients condition and plan of care including tests being ordered have been discussed with the patient and *his wife and mother at bedside who indicate understanding and agree with the plan and Code Status.   Time Spent on Admission: 55 minutes  [facility] Pager: [phone] 06/10/2014, 11:49 PM  If 7PM-7AM, please contact night-coverage  [email or web address]  Password [email or web address]

## 2014-06-10 NOTE — Telephone Encounter (Signed)
Patient having discomforting pressure in chest and SOB w/ nausea lasting since AM. This happened on and off since Monday. Denies dizziness, fatigue.   He thinks could be GERD though he notes he has taken his GERD meds, i stated we don't have a way to determine whether or not it is cardiac in nature over the phone.  Stated that he should go to ED for evaluation for active pain & SOB.  Pt voiced understanding. States he has a son who will drive him to the hospital shortly.

## 2014-06-10 NOTE — ED Provider Notes (Signed)
CSN: [id number]     Arrival date & time 06/10/14  1642 History  This chart was scribed for [employer]. Alvino Chapel, MD by Molli Posey, ED Scribe. This patient was seen in room APA05/AP[facility]05 and the patient's care was started 5:05 PM.    Chief Complaint  Patient presents with  . Chest Pain   The history is provided by the patient. No language interpreter was used.   HPI Comments: Steven Oneal is a 69 y.o. male with a history of DVT of left leg, PE, HTN, CAD and MI who presents to the Emergency Department complaining of chest pain for the last 2 days. Pt states that his pain radiates to his right arm. He describes the pain as sharp and as a pressure. He reports associated increased sweating, light headedness and dizziness with his CP. Pt states that he is still experiencing tightness and pain at this time. He says that eating or activity does not worsen his chest pain. Pt states he is typically SOB at his baseline but says it has worsened the last 2 days. He says that his symptoms feels similar to his prior blood clot which he says was in his left leg and left lung, and states that he was on coumadin at that time as well. He states that he does not have nitroglycerin at home. Pt reports he has been taking his medications regularly and he reports he is on Coumadin currently. He denies leg swelling, nausea or blood in his stool. Pt denies any known sick contacts.   Past Medical History  Diagnosis Date  . Allergic rhinitis   . Acid reflux   . Hypertension   . Hyperlipidemia   . DVT of leg (deep venous thrombosis)   . Pulmonary embolus   . On continuous oral anticoagulation   . History of MI (myocardial infarction) 2007  . CAD (coronary artery disease)     stents in 2007 2 stents DES cypher to RCA  . Myocardial infarction 2007  . Vertigo    Past Surgical History  Procedure Laterality Date  . Appendectomy    . Colonoscopy  11/2003    single anal papilla and int hemorrhoids, pancolonic  tics  . Coronary angioplasty with stent placement  06/15/2005    cypher stent to mid RCA; cypher stent -ostial RCA, RESIDUAL disease LAD  . Cardiac catheterization  10/09/2005    patent stents, medical therapy  . Cardiac catheterization  10/22/2006    non obstructive disease, 60% LAD, patent RCA stents  . Cholecystectomy N/A 07/14/2013    Procedure: LAPAROSCOPIC CHOLECYSTECTOMY;  Surgeon: Jamesetta So, MD;  Location: [facility];  Service: General;  Laterality: N/A;   Family History  Problem Relation Age of Onset  . Colon cancer Mother 18  . Colon cancer Paternal Uncle 59  . Colon cancer Brother 66    rectal cancer  . Colon cancer Paternal Uncle 85  . Throat cancer Brother 63  . Prostate cancer Brother   . Pulmonary embolism Brother     with cancer   History  Substance Use Topics  . Smoking status: Former Smoker -- 0.10 packs/day for 13 years    Types: Cigarettes    Quit date: 05/27/1977  . Smokeless tobacco: Never Used  . Alcohol Use: No    Review of Systems  Constitutional: Positive for diaphoresis.  Respiratory: Positive for chest tightness and shortness of breath.   Cardiovascular: Positive for chest pain. Negative for leg swelling.  Gastrointestinal: Negative for  nausea and blood in stool.  Neurological: Positive for dizziness and light-headedness.  All other systems reviewed and are negative.     Allergies  Codeine  Home Medications   Prior to Admission medications   Medication Sig Start Date End Date Taking? Authorizing Provider  amLODipine (NORVASC) 5 MG tablet TAKE ONE TABLET BY MOUTH ONCE DAILY 01/27/14  Yes Lorretta Harp, MD  aspirin EC 81 MG tablet Take 81 mg by mouth daily.   Yes Historical Provider, MD  carvedilol (COREG) 6.25 MG tablet Take 1 tablet (6.25 mg total) by mouth 2 (two) times daily. 04/29/13  Yes Erlene Quan, PA-C  carvedilol (COREG) 6.25 MG tablet Take 1 tablet (6.25 mg total) by mouth 2 (two) times daily with a meal. 11/18/13  Yes Lorretta Harp, MD  diazepam (VALIUM) 10 MG tablet Take 10 mg by mouth every 6 (six) hours as needed for anxiety.    Yes Historical Provider, MD  fish oil-omega-3 fatty acids 1000 MG capsule Take 3 g by mouth daily.     Yes Historical Provider, MD  losartan (COZAAR) 50 MG tablet Take 1 tablet (50 mg total) by mouth daily. 11/18/13  Yes Lorretta Harp, MD  Multiple Vitamin (MULTIVITAMIN WITH MINERALS) TABS tablet Take 1 tablet by mouth daily.   Yes Historical Provider, MD  omeprazole (PRILOSEC) 20 MG capsule Take 20 mg by mouth daily.     Yes Historical Provider, MD  pravastatin (PRAVACHOL) 80 MG tablet Take 1 tablet (80 mg total) by mouth daily. 11/18/13  Yes Lorretta Harp, MD  warfarin (COUMADIN) 4 MG tablet Take 1 tablet by mouth as directed. Per coumadin clinic - Monday through Friday 04/09/14  Yes Historical Provider, MD  meclizine (ANTIVERT) 25 MG tablet Take 25 mg by mouth as needed for dizziness.  05/13/13   Historical Provider, MD  oxyCODONE-acetaminophen (PERCOCET) 7.5-325 MG per tablet Take 1-2 tablets by mouth every 4 (four) hours as needed. Patient not taking: Reported on 06/10/2014 07/14/13   Jamesetta So, MD  warfarin (COUMADIN) 5 MG tablet Take 5 mg by mouth as directed. Per coumadin clinic - Saturday and Sunday    Historical Provider, MD   BP 124/78 mmHg  Pulse 79  Temp(Src) 97.6 F (36.4 C) (Oral)  Resp 15  Ht 5\' 9"  (1.753 m)  Wt 233 lb (105.688 kg)  BMI 34.39 kg/m2  SpO2 93% Physical Exam  Constitutional: He is oriented to person, place, and time. He appears well-developed and well-nourished.  HENT:  Head: Normocephalic and atraumatic.  Neck: Neck supple. No JVD present.  Cardiovascular: Normal rate, regular rhythm and normal heart sounds.   Pulmonary/Chest: Effort normal and breath sounds normal. No respiratory distress. He has no rales.  Abdominal: He exhibits no distension. There is no tenderness.  Musculoskeletal: He exhibits edema.  Bilateral lower extremity pitting  edema.   Neurological: He is alert and oriented to person, place, and time.  Skin: Skin is warm and dry.  Psychiatric: He has a normal mood and affect. His behavior is normal.  Nursing note and vitals reviewed.   ED Course  Procedures   DIAGNOSTIC STUDIES: Oxygen Saturation is 98% on RA, normal by my interpretation.    COORDINATION OF CARE: 5:10 PM Discussed treatment plan with pt at bedside and pt agreed to plan.   Labs Review Labs Reviewed  COMPREHENSIVE METABOLIC PANEL - Abnormal; Notable for the following:    Glucose, Bld 103 (*)    All other components  within normal limits  PROTIME-INR - Abnormal; Notable for the following:    Prothrombin Time 24.6 (*)    INR 2.20 (*)    All other components within normal limits  CBC  TROPONIN I  TROPONIN I  CBC  COMPREHENSIVE METABOLIC PANEL  TROPONIN I  TROPONIN I  TROPONIN I  PROTIME-INR    Imaging Review Dg Chest 2 View  06/10/2014   CLINICAL DATA:  Chest pain.  Occasional shortness of breath.  EXAM: CHEST  2 VIEW  COMPARISON:  02/21/2010  FINDINGS: Heart size and vascularity are normal and the lungs are clear. No effusions. Chronic accentuation of the thoracic kyphosis.  IMPRESSION: No acute abnormalities.   Electronically Signed   By: Lorriane Shire M.D.   On: 06/10/2014 17:32   Ct Angio Chest Pe W/cm &/or Wo Cm  06/10/2014   CLINICAL DATA:  Chest pain for 2 days which radiates into the right arm.  EXAM: CT ANGIOGRAPHY CHEST WITH CONTRAST  TECHNIQUE: Multidetector CT imaging of the chest was performed using the standard protocol during bolus administration of intravenous contrast. Multiplanar CT image reconstructions and MIPs were obtained to evaluate the vascular anatomy.  CONTRAST:  171mL OMNIPAQUE IOHEXOL 350 MG/ML SOLN  COMPARISON:  CT thorax 11/08/2012  FINDINGS: Mediastinum/Nodes: No axillary or supraclavicular lymphadenopathy. No mediastinal hilar lymphadenopathy. No pericardial fluid.  There are no filling defects within  the pulmonary arteries to suggest acute pulmonary embolism. No acute findings of the aorta or great vessels. Esophagus is normal.  Lungs/Pleura: No pulmonary infarction. Mild subpleural atelectasis of the right lung base. No pleural fluid or pulmonary edema.  Upper abdomen: Limited view of the liver, kidneys, pancreas are unremarkable. Normal adrenal glands.  Musculoskeletal: Degenerate spurring of the spine.  Review of the MIP images confirms the above findings.  IMPRESSION: 1. No acute pulmonary embolism. 2. Mild right subpleural atelectasis   Electronically Signed   By: Suzy Bouchard M.D.   On: 06/10/2014 20:48     EKG Interpretation   Date/Time:  Wednesday June 10 2014 16:49:52 EST Ventricular Rate:  83 PR Interval:  212 QRS Duration: 93 QT Interval:  362 QTC Calculation: 425 R Axis:   -23 Text Interpretation:  Sinus rhythm Borderline prolonged PR interval  Borderline left axis deviation Low voltage, precordial leads Consider  anterior infarct Baseline wander in lead(s) I III aVL Confirmed by  Alvino Chapel  MD, Ovid Curd [phone]) on 06/10/2014 5:02:03 PM      MDM   Final diagnoses:  Chest pain, unspecified chest pain type   Patient with chest pain. EKG overall reassuring but does have cardiac history. Also previous pulmonary embolism on Coumadin. CT scan reassuring for pulmonary embolism. Pain has been relieved with nitroglycerin. His artery on Coumadin. Cardiac enzymes negative 2. Will admit to internal medicine.   I personally performed the services described in this documentation, which was scribed in my presence. The recorded information has been reviewed and is accurate.       Jasper Riling. Alvino Chapel, MD 06/11/14 0040

## 2014-06-10 NOTE — ED Notes (Signed)
Patient rates chest pain as 4/10.

## 2014-06-10 NOTE — ED Notes (Signed)
Per dr pickering nitro drip was discontinued and nitro paste was applied to pts chest

## 2014-06-10 NOTE — Telephone Encounter (Signed)
Pt c/o of Chest Pain: STAT if CP now or developed within 24 hours  1. Are you having CP right now?  Pressure  2. Are you experiencing any other symptoms (ex. SOB, nausea, vomiting, sweating)? Yes,sob  3. How long have you been experiencing CP? Monday  4. Is your CP continuous or coming and going? Real bad in the morning,today all day  5. Have you taken Nitroglycerin? no ?

## 2014-06-10 NOTE — ED Notes (Signed)
Pt reports chest tightness, belching since Monday. Pt reports pain radiating to right arm. Mild dyspnea noted on exertion.

## 2014-06-11 ENCOUNTER — Encounter (HOSPITAL_COMMUNITY): Payer: Self-pay | Admitting: *Deleted

## 2014-06-11 DIAGNOSIS — Z7901 Long term (current) use of anticoagulants: Secondary | ICD-10-CM

## 2014-06-11 DIAGNOSIS — K219 Gastro-esophageal reflux disease without esophagitis: Secondary | ICD-10-CM

## 2014-06-11 DIAGNOSIS — I200 Unstable angina: Secondary | ICD-10-CM

## 2014-06-11 DIAGNOSIS — Z86711 Personal history of pulmonary embolism: Secondary | ICD-10-CM

## 2014-06-11 DIAGNOSIS — R079 Chest pain, unspecified: Secondary | ICD-10-CM

## 2014-06-11 DIAGNOSIS — I25110 Atherosclerotic heart disease of native coronary artery with unstable angina pectoris: Principal | ICD-10-CM

## 2014-06-11 DIAGNOSIS — I10 Essential (primary) hypertension: Secondary | ICD-10-CM | POA: Diagnosis not present

## 2014-06-11 DIAGNOSIS — E785 Hyperlipidemia, unspecified: Secondary | ICD-10-CM | POA: Diagnosis not present

## 2014-06-11 LAB — CBC
HEMATOCRIT: 39.6 % (ref 39.0–52.0)
Hemoglobin: 13.6 g/dL (ref 13.0–17.0)
MCH: 29.1 pg (ref 26.0–34.0)
MCHC: 34.3 g/dL (ref 30.0–36.0)
MCV: 84.6 fL (ref 78.0–100.0)
PLATELETS: 193 10*3/uL (ref 150–400)
RBC: 4.68 MIL/uL (ref 4.22–5.81)
RDW: 13.5 % (ref 11.5–15.5)
WBC: 6.2 10*3/uL (ref 4.0–10.5)

## 2014-06-11 LAB — COMPREHENSIVE METABOLIC PANEL
ALT: 30 U/L (ref 0–53)
AST: 22 U/L (ref 0–37)
Albumin: 3.4 g/dL — ABNORMAL LOW (ref 3.5–5.2)
Alkaline Phosphatase: 36 U/L — ABNORMAL LOW (ref 39–117)
Anion gap: 9 (ref 5–15)
BUN: 8 mg/dL (ref 6–23)
CO2: 25 mmol/L (ref 19–32)
Calcium: 8.6 mg/dL (ref 8.4–10.5)
Chloride: 105 mmol/L (ref 96–112)
Creatinine, Ser: 0.66 mg/dL (ref 0.50–1.35)
GFR calc Af Amer: >90 mL/min (ref >90)
Glucose, Bld: 127 mg/dL — ABNORMAL HIGH (ref 70–99)
Potassium: 3.3 mmol/L — ABNORMAL LOW (ref 3.5–5.1)
Sodium: 139 mmol/L (ref 135–145)
TOTAL PROTEIN: 6 g/dL (ref 6.0–8.3)
Total Bilirubin: 1.1 mg/dL (ref 0.3–1.2)

## 2014-06-11 LAB — TROPONIN I
Troponin I: <0.03 ng/mL (ref <0.031)
Troponin I: <0.03 ng/mL (ref <0.031)

## 2014-06-11 LAB — PROTIME-INR
INR: 2.07 — ABNORMAL HIGH (ref 0.00–1.49)
PROTHROMBIN TIME: 23.5 s — AB (ref 11.6–15.2)

## 2014-06-11 MED ORDER — WARFARIN - PHARMACIST DOSING INPATIENT: Status: DC

## 2014-06-11 MED ORDER — SODIUM CHLORIDE 0.9 % IJ SOLN: 3.0000 mL | INTRAMUSCULAR | Status: DC | PRN

## 2014-06-11 MED ORDER — ACETAMINOPHEN 650 MG RE SUPP: 650.0000 mg | Freq: Four times a day (QID) | RECTAL | Status: DC | PRN

## 2014-06-11 MED ORDER — LOSARTAN POTASSIUM 50 MG PO TABS
50.0000 mg | ORAL_TABLET | Freq: Every day | ORAL | Status: DC
  Administered 2014-06-11 – 2014-06-12 (×2): 50 mg via ORAL
  Filled 2014-06-11 (×2): qty 1

## 2014-06-11 MED ORDER — PANTOPRAZOLE SODIUM 40 MG PO TBEC
40.0000 mg | DELAYED_RELEASE_TABLET | Freq: Every day | ORAL | Status: DC
  Administered 2014-06-11 – 2014-06-12 (×2): 40 mg via ORAL
  Filled 2014-06-11 (×2): qty 1

## 2014-06-11 MED ORDER — ACETAMINOPHEN 325 MG PO TABS: 650.0000 mg | ORAL_TABLET | Freq: Four times a day (QID) | ORAL | Status: DC | PRN

## 2014-06-11 MED ORDER — POTASSIUM CHLORIDE CRYS ER 20 MEQ PO TBCR
40.0000 meq | EXTENDED_RELEASE_TABLET | Freq: Once | ORAL | Status: AC
  Administered 2014-06-11: 40 meq via ORAL
  Filled 2014-06-11: qty 2

## 2014-06-11 MED ORDER — ONDANSETRON HCL 4 MG PO TABS: 4.0000 mg | ORAL_TABLET | Freq: Four times a day (QID) | ORAL | Status: DC | PRN

## 2014-06-11 MED ORDER — ASPIRIN EC 81 MG PO TBEC
81.0000 mg | DELAYED_RELEASE_TABLET | Freq: Every day | ORAL | Status: DC
  Administered 2014-06-11 – 2014-06-12 (×2): 81 mg via ORAL
  Filled 2014-06-11 (×2): qty 1

## 2014-06-11 MED ORDER — PRAVASTATIN SODIUM 40 MG PO TABS
80.0000 mg | ORAL_TABLET | Freq: Every day | ORAL | Status: DC
  Administered 2014-06-11 – 2014-06-12 (×2): 80 mg via ORAL
  Filled 2014-06-11 (×2): qty 2

## 2014-06-11 MED ORDER — DIAZEPAM 5 MG PO TABS
10.0000 mg | ORAL_TABLET | Freq: Four times a day (QID) | ORAL | Status: DC | PRN
  Administered 2014-06-11: 10 mg via ORAL
  Filled 2014-06-11: qty 2

## 2014-06-11 MED ORDER — CARVEDILOL 3.125 MG PO TABS
6.2500 mg | ORAL_TABLET | Freq: Two times a day (BID) | ORAL | Status: DC
  Administered 2014-06-11 – 2014-06-12 (×3): 6.25 mg via ORAL
  Filled 2014-06-11 (×3): qty 2

## 2014-06-11 MED ORDER — ONDANSETRON HCL 4 MG/2ML IJ SOLN: 4.0000 mg | Freq: Four times a day (QID) | INTRAMUSCULAR | Status: DC | PRN

## 2014-06-11 MED ORDER — AMLODIPINE BESYLATE 5 MG PO TABS
5.0000 mg | ORAL_TABLET | Freq: Every day | ORAL | Status: DC
  Administered 2014-06-11 – 2014-06-12 (×2): 5 mg via ORAL
  Filled 2014-06-11 (×2): qty 1

## 2014-06-11 MED ORDER — POTASSIUM CHLORIDE CRYS ER 20 MEQ PO TBCR: 40.0000 meq | EXTENDED_RELEASE_TABLET | Freq: Once | ORAL | Status: AC

## 2014-06-11 MED ORDER — SODIUM CHLORIDE 0.9 % IV SOLN: 250.0000 mL | INTRAVENOUS | Status: DC | PRN

## 2014-06-11 MED ORDER — SODIUM CHLORIDE 0.9 % IJ SOLN
3.0000 mL | Freq: Two times a day (BID) | INTRAMUSCULAR | Status: DC
  Administered 2014-06-11 – 2014-06-12 (×4): 3 mL via INTRAVENOUS

## 2014-06-11 NOTE — Consult Note (Signed)
CARDIOLOGY CONSULT NOTE   Patient ID: Steven Oneal MRN: [id number] DOB/AGE: 1946/02/11 69 y.o.  Admit Date: 06/10/2014 Referring Physician: PTH Primary Physician: Glo Herring., MD Consulting Cardiologist: Seren Chaloux MD Primary Cardiologist: Quay Burow MD Reason for Consultation: Chest Pain  Clinical Summary Steven Oneal is a 69 y.o.male with with past medical history outlined below who was in his usual state of health until 4 days ago when he began to experience sharp chest discomfort radiating from his right chest to "my heart." He typically walks 4 miles a day (2 in the am and 2 in the pm), but since onset of symptoms a few days ago, he has had decreased stamina, and shortness of breath simply walking around in his house. He experienced racing HR as well, along with some diaphoresis.  He states he has been having night sweats for several days as well. Pain has been occuring every day since Sunday. Sometimes coming more than one a day. Because his symptoms persisted, he presented to ER. Not entirely the same as his symptoms from 2007 at which time he had more indigestion, although shortness of breath and fatigue are reminiscent.   In ER BP 142/81, HR 86. O2 sat 98%. Potassium 3.8, INR 2.20. Chest CT negative for PE, CXR negative for CHF or pneumonia. EKG: NSR without acute ST-T wave changes. He was treated with NTG SL, started on a NTG gtt and transitioned to NTG paste. He was given morphine. States that he had sharp pain again overnight around 3:30 am. Was given tylenol and valium and went to sleep. Currently pain free. Subsequent troponin's are negative.   Allergies  Allergen Reactions  . Codeine Itching, Nausea Only and Rash    Medications Scheduled Medications: . amLODipine  5 mg Oral Daily  . aspirin EC  81 mg Oral Daily  . carvedilol  6.25 mg Oral BID WC  . losartan  50 mg Oral Daily  . nitroGLYCERIN  1 inch Topical 4 times per day  . pantoprazole  40  mg Oral Daily  . pravastatin  80 mg Oral Daily  . sodium chloride  3 mL Intravenous Q12H  . Warfarin - Pharmacist Dosing Inpatient   Does not apply Q24H    PRN Medications: sodium chloride, acetaminophen **OR** acetaminophen, diazepam, ondansetron **OR** ondansetron (ZOFRAN) IV, sodium chloride   Past Medical History  Diagnosis Date  . Allergic rhinitis   . GERD (gastroesophageal reflux disease)   . Essential hypertension   . Hyperlipidemia   . History of DVT (deep vein thrombosis) July 2014  . History of pulmonary embolus (PE) July 2014  . On continuous oral anticoagulation   . CAD (coronary artery disease)     DES Cypher x 2 RCA 2007  . Myocardial infarction 2007  . Vertigo     Past Surgical History  Procedure Laterality Date  . Appendectomy    . Colonoscopy  11/2003    Single anal papilla and int hemorrhoids, pancolonic tics  . Coronary angioplasty with stent placement  06/15/2005    Cypher stent to mid RCA; cypher stent -ostial RCA, RESIDUAL disease LAD  . Cardiac catheterization  10/09/2005    Patent stents, medical therapy  . Cardiac catheterization  10/22/2006    Nonobstructive disease, 60% LAD, patent RCA stents  . Cholecystectomy N/A 07/14/2013    Procedure: LAPAROSCOPIC CHOLECYSTECTOMY;  Surgeon: Jamesetta So, MD;  Location: [facility];  Service: General;  Laterality: N/A;    Family History  Problem Relation Age  of Onset  . Colon cancer Mother 98  . Colon cancer Paternal Uncle 54  . Colon cancer Brother 5    Rectal cancer  . Colon cancer Paternal Uncle 15  . Throat cancer Brother 2  . Prostate cancer Brother   . Pulmonary embolism Brother     Cancer    Social History Steven Oneal reports that he quit smoking about 37 years ago. His smoking use included Cigarettes. He has a 1.3 pack-year smoking history. He has never used smokeless tobacco. Steven Oneal reports that he does not drink alcohol.  Review of Systems Complete review of systems are found to be  negative unless outlined in H&P above.  Physical Examination Blood pressure 112/62, pulse 84, temperature 97.6 F (36.4 C), temperature source Oral, resp. rate 15, height 5\' 9"  (1.753 m), weight 230 lb 14.4 oz (104.736 kg), SpO2 95 %.  Intake/Output Summary (Last 24 hours) at 06/11/14 0933 Last data filed at 06/11/14 0855  Gross per 24 hour  Intake    840 ml  Output    300 ml  Net    540 ml    Telemetry: NSR, occasional PAC.  GEN:No acute distress HEENT: Conjunctiva and lids normal, oropharynx clear with moist mucosa. Neck: Supple, no elevated JVP or carotid bruits, no thyromegaly. Lungs: Clear to auscultation, nonlabored breathing at rest. Cardiac: Regular rate and rhythm, no S3 or significant systolic murmur, no pericardial rub. Abdomen: Soft, nontender, obese,no hepatomegaly, bowel sounds present, no guarding or rebound. Extremities: No pitting edema, distal pulses 2+. Birth mark on the medial left lower leg.  Skin: Warm and dry. Musculoskeletal: No kyphosis. Neuropsychiatric: Alert and oriented x3, affect grossly appropriate.  Prior Cardiac Testing/Procedures 1. NM Stress test 05/07/2013 Overall Impression: Low risk stress nuclear study with basal  inferior wall non transmural infarction.. LV Wall Motion: Normal overall systolic function. Mild basal  inferior wall hypokinesis.  2. Cardiac Cath 06/15/2005 CONCLUSION: 1. Successful percutaneous transluminal coronary balloon angioplasty and  placement of a CYPHER 3 x 33 in the mid right coronary artery stenotic  lesion ultimately post dilated to 3.67 mm. 2. Successful placement of a CYPHER 3.5 x 8 mm stent in the ostial portion  of the right coronary artery. 3. 70% residual proximal left anterior descending disease with 80% ostial  diagonal disease. 4. 70% mid obtuse marginal stenotic lesion. 5. Normal left ventricular function.  Cardiac Cath 10/22/2006 1. Left main normal. 2. LAD: LAD had  a smooth segmental 60% proximal stenosis. 3. Left circumflex: Free of significant disease. 4. Ramus branch: Free of significant disease. 5. Right coronary artery: Patent stents in the proximal segment with  40% fairly focal stenosis just prior to that, 40% in the genu and  crux of the vessel.  Lab Results  Basic Metabolic Panel:  Recent Labs Lab 06/10/14 1831 06/11/14 0645  NA 139 139  K 3.8 3.3*  CL 107 105  CO2 26 25  GLUCOSE 103* 127*  BUN 8 8  CREATININE 0.61 0.66  CALCIUM 8.7 8.6    Liver Function Tests:  Recent Labs Lab 06/10/14 1831 06/11/14 0645  AST 25 22  ALT 32 30  ALKPHOS 39 36*  BILITOT 1.0 1.1  PROT 6.5 6.0  ALBUMIN 3.7 3.4*    CBC:  Recent Labs Lab 06/10/14 1831 06/11/14 0645  WBC 8.7 6.2  HGB 14.0 13.6  HCT 40.6 39.6  MCV 86.0 84.6  PLT 200 193    Cardiac Enzymes:  Recent Labs Lab 06/10/14 1831  06/10/14 2210 06/11/14 0020 06/11/14 0645  TROPONINI <0.03 <0.03 <0.03 <0.03    Radiology: Dg Chest 2 View  06/10/2014   CLINICAL DATA:  Chest pain.  Occasional shortness of breath.  EXAM: CHEST  2 VIEW  COMPARISON:  02/21/2010  FINDINGS: Heart size and vascularity are normal and the lungs are clear. No effusions. Chronic accentuation of the thoracic kyphosis.  IMPRESSION: No acute abnormalities.   Electronically Signed   By: Lorriane Shire M.D.   On: 06/10/2014 17:32   Ct Angio Chest Pe W/cm &/or Wo Cm  06/10/2014   CLINICAL DATA:  Chest pain for 2 days which radiates into the right arm.  EXAM: CT ANGIOGRAPHY CHEST WITH CONTRAST  TECHNIQUE: Multidetector CT imaging of the chest was performed using the standard protocol during bolus administration of intravenous contrast. Multiplanar CT image reconstructions and MIPs were obtained to evaluate the vascular anatomy.  CONTRAST:  153mL OMNIPAQUE IOHEXOL 350 MG/ML SOLN  COMPARISON:  CT thorax 11/08/2012  FINDINGS: Mediastinum/Nodes: No axillary or supraclavicular lymphadenopathy. No  mediastinal hilar lymphadenopathy. No pericardial fluid.  There are no filling defects within the pulmonary arteries to suggest acute pulmonary embolism. No acute findings of the aorta or great vessels. Esophagus is normal.  Lungs/Pleura: No pulmonary infarction. Mild subpleural atelectasis of the right lung base. No pleural fluid or pulmonary edema.  Upper abdomen: Limited view of the liver, kidneys, pancreas are unremarkable. Normal adrenal glands.  Musculoskeletal: Degenerate spurring of the spine.  Review of the MIP images confirms the above findings.  IMPRESSION: 1. No acute pulmonary embolism. 2. Mild right subpleural atelectasis   Electronically Signed   By: Suzy Bouchard M.D.   On: 06/10/2014 20:48     ECG: NSR with borderline PR interval. Evidence of anterior MI.   Impression and Recommendations:  1. Chest Pain: Typical and atypical features, also associated with exertional fatigue and shortness of breath. He is medically compliant. Troponin I negative and ECG unremarkable. He has had recurrent pain despite NTG paste.  2. CAD: Hx of PCI and Stents to the RCA in 2007: Residual disease in LAD of 60 %, patent stent in 2008 on repeat cath. Continue ASA, BBm statin.  3. Hypertension: Well controlled on current medication regimen off BB and amlodipine.  4. Hx of DVT and PE: Negative for PE on CT scan. Coumadin managed by Dr. Gerarda Fraction office. INR is therapeutic.  5. Palpitations: Associated with chest pain. Cannot rule out cardiac arrhythmia vs anxiety related to chest pain. He is obese and my have OSA as well which can precede atrial fib. He is currently on coumadin and BB.    Signed: Phill Myron. Lawrence NP [place]  06/11/2014, 9:33 AM Co-Sign MD   Attending note:  Patient seen and examined. Reviewed extensive records and modified above note by Ms. Lawrence NP. Steven Oneal has a history of DES  2 to the RCA back in 2007, moderate nonobstructive residual LAD disease by last cardiac  catheterization in 2008. He has been managed medically, doing reasonably well with regular walking regimen up until this past Sunday when he began to experience recurring chest discomfort, fairly atypical in description, although associated with exertional fatigue and shortness of breath. Also feeling of palpitations, although no arrhythmias documented so far. Cardiac markers have been negative, and ECG is nonspecific. Chest CT scan showed no evidence of recurrent pulmonary embolus, he has been compliant with Coumadin, INR therapeutic at 2.0. Patient states that he definitely feels less stamina which is unusual  for him, concerned that it is his heart. Last ischemic evaluation was via Myoview in January 2015 revealing inferobasal scar with mild inferolateral hypokinesis. On examination he is comfortable this morning, lungs are clear without labored breathing, cardiac exam with RRR no gallop. We have discussed options for follow-up cardiac testing including both noninvasive and invasive techniques. After reviewing the risks and benefits, he is in agreement to proceeding with a diagnostic heart catheterization to clearly evaluate coronary anatomy and assess for revascularization options. Coumadin will be held today, potential transfer to our cardiology service at [facility] either later today or tomorrow for heart catheterization if INR drifts down appropriately.  Satira Sark, M.D., F.A.C.C.

## 2014-06-11 NOTE — Progress Notes (Signed)
[facility] PROGRESS NOTE  BEXLEY MCLESTER IRC:[id number] DOB: [birth year] DOA: 06/10/2014 PCP: Glo Herring., MD  Assessment/Plan: Chest pain Patient has significant history of CAD, pulmonary embolism. CT angiogram of the chest was negative for PE. Cardiac enzymes 2.  Initially was started on nitroglycerin drip and  transitioned to Nitropaste 1 inch every 6 hours. No further episodes. Evaluated by cardiology who opine diagnostic heart catheterization may be needed to clearly evaluated coronary anatomy and assess for revascularization options. Coumadin on hold for same. Plan to re-evaluate in am and transfer to [facility] if needed for cath  History of pulmonary embolism CT chest negative for PE. Has been on coumadin. INR 2.07 today. Will hold coumadin as noted above. Monitor  Hypokalemia: mild. Will replete and recheck. Check mag level as well.   HTN: controlled. Home meds include norvasc and coreg, cozaar.  CAD: with hx stents see #1. Chart review indicates cardiac cath 2008 with patent stents. Continue aspirin  Hyperlipidemia: recent lipid panel with triglyceride 183 and HDL 37 Continue Pravachol.   Code Status: full Family Communication: none presente Disposition Plan: home when ready   Consultants:  cardilogy  Procedures:  none  Antibiotics:  HPI/Subjective: Sitting on side of bed. Denies pain/discomfort  Objective: Filed Vitals:   06/11/14 1348  BP: 132/78  Pulse: 95  Temp: 97.6 F (36.4 C)  Resp: 17    Intake/Output Summary (Last 24 hours) at 06/11/14 1419 Last data filed at 06/11/14 1300  Gross per 24 hour  Intake   1080 ml  Output   1200 ml  Net   -120 ml   Filed Weights   06/10/14 1650 06/11/14 0045  Weight: 105.688 kg (233 lb) 104.736 kg (230 lb 14.4 oz)    Exam:   General:  Well nourished appears calm and comfortable  Cardiovascular: RRR no MGR trace LE edema  Respiratory: normal effort BS clear bilaterally to auscultation no wheeze  no rhonchi  Abdomen: non-distended non-tender to palpation  Musculoskeletal: no clubbing or cyanosis   Data Reviewed: Basic Metabolic Panel:  Recent Labs Lab 06/10/14 1831 06/11/14 0645  NA 139 139  K 3.8 3.3*  CL 107 105  CO2 26 25  GLUCOSE 103* 127*  BUN 8 8  CREATININE 0.61 0.66  CALCIUM 8.7 8.6   Liver Function Tests:  Recent Labs Lab 06/10/14 1831 06/11/14 0645  AST 25 22  ALT 32 30  ALKPHOS 39 36*  BILITOT 1.0 1.1  PROT 6.5 6.0  ALBUMIN 3.7 3.4*   No results for input(s): LIPASE, AMYLASE in the last 168 hours. No results for input(s): AMMONIA in the last 168 hours. CBC:  Recent Labs Lab 06/10/14 1831 06/11/14 0645  WBC 8.7 6.2  HGB 14.0 13.6  HCT 40.6 39.6  MCV 86.0 84.6  PLT 200 193   Cardiac Enzymes:  Recent Labs Lab 06/10/14 1831 06/10/14 2210 06/11/14 0020 06/11/14 0645 06/11/14 1210  TROPONINI <0.03 <0.03 <0.03 <0.03 <0.03   BNP (last 3 results) No results for input(s): BNP in the last 8760 hours.  ProBNP (last 3 results) No results for input(s): PROBNP in the last 8760 hours.  CBG: No results for input(s): GLUCAP in the last 168 hours.  No results found for this or any previous visit (from the past 240 hour(s)).   Studies: Dg Chest 2 View  06/10/2014   CLINICAL DATA:  Chest pain.  Occasional shortness of breath.  EXAM: CHEST  2 VIEW  COMPARISON:  02/21/2010  FINDINGS: Heart size and  vascularity are normal and the lungs are clear. No effusions. Chronic accentuation of the thoracic kyphosis.  IMPRESSION: No acute abnormalities.   Electronically Signed   By: Lorriane Shire M.D.   On: 06/10/2014 17:32   Ct Angio Chest Pe W/cm &/or Wo Cm  06/10/2014   CLINICAL DATA:  Chest pain for 2 days which radiates into the right arm.  EXAM: CT ANGIOGRAPHY CHEST WITH CONTRAST  TECHNIQUE: Multidetector CT imaging of the chest was performed using the standard protocol during bolus administration of intravenous contrast. Multiplanar CT image  reconstructions and MIPs were obtained to evaluate the vascular anatomy.  CONTRAST:  150mL OMNIPAQUE IOHEXOL 350 MG/ML SOLN  COMPARISON:  CT thorax 11/08/2012  FINDINGS: Mediastinum/Nodes: No axillary or supraclavicular lymphadenopathy. No mediastinal hilar lymphadenopathy. No pericardial fluid.  There are no filling defects within the pulmonary arteries to suggest acute pulmonary embolism. No acute findings of the aorta or great vessels. Esophagus is normal.  Lungs/Pleura: No pulmonary infarction. Mild subpleural atelectasis of the right lung base. No pleural fluid or pulmonary edema.  Upper abdomen: Limited view of the liver, kidneys, pancreas are unremarkable. Normal adrenal glands.  Musculoskeletal: Degenerate spurring of the spine.  Review of the MIP images confirms the above findings.  IMPRESSION: 1. No acute pulmonary embolism. 2. Mild right subpleural atelectasis   Electronically Signed   By: Suzy Bouchard M.D.   On: 06/10/2014 20:48    Scheduled Meds: . amLODipine  5 mg Oral Daily  . aspirin EC  81 mg Oral Daily  . carvedilol  6.25 mg Oral BID WC  . losartan  50 mg Oral Daily  . nitroGLYCERIN  1 inch Topical 4 times per day  . pantoprazole  40 mg Oral Daily  . potassium chloride  40 mEq Oral Once  . pravastatin  80 mg Oral Daily  . sodium chloride  3 mL Intravenous Q12H   Continuous Infusions:   Principal Problem:   Chest pain Active Problems:   Pulmonary embolus   Hyperlipidemia   Chronic anticoagulation for PE, DVT    Time spent: 35 minutes    [facility] Pager [phone]. If 7PM-7AM, please contact night-coverage at [email or web address], password [facility] 06/11/2014, 2:19 PM  LOS: 1 day

## 2014-06-12 ENCOUNTER — Encounter (HOSPITAL_COMMUNITY)
Admission: EM | Disposition: A | Discharge status: Home / Self Care | Payer: Self-pay | Source: Home / Self Care | Attending: Internal Medicine

## 2014-06-12 ENCOUNTER — Encounter (HOSPITAL_COMMUNITY): Payer: Self-pay | Admitting: Cardiology

## 2014-06-12 DIAGNOSIS — I25110 Atherosclerotic heart disease of native coronary artery with unstable angina pectoris: Secondary | ICD-10-CM | POA: Diagnosis not present

## 2014-06-12 HISTORY — PX: LEFT HEART CATHETERIZATION WITH CORONARY ANGIOGRAM: SHX5451

## 2014-06-12 LAB — BASIC METABOLIC PANEL
Anion gap: 4 — ABNORMAL LOW (ref 5–15)
BUN: 8 mg/dL (ref 6–23)
CALCIUM: 8.2 mg/dL — AB (ref 8.4–10.5)
CO2: 25 mmol/L (ref 19–32)
Chloride: 108 mmol/L (ref 96–112)
Creatinine, Ser: 0.66 mg/dL (ref 0.50–1.35)
GFR calc Af Amer: >90 mL/min (ref >90)
Glucose, Bld: 142 mg/dL — ABNORMAL HIGH (ref 70–99)
POTASSIUM: 3.7 mmol/L (ref 3.5–5.1)
Sodium: 137 mmol/L (ref 135–145)

## 2014-06-12 LAB — CBC
HEMATOCRIT: 38.3 % — AB (ref 39.0–52.0)
HEMOGLOBIN: 13.1 g/dL (ref 13.0–17.0)
MCH: 29 pg (ref 26.0–34.0)
MCHC: 34.2 g/dL (ref 30.0–36.0)
MCV: 84.9 fL (ref 78.0–100.0)
Platelets: 196 10*3/uL (ref 150–400)
RBC: 4.51 MIL/uL (ref 4.22–5.81)
RDW: 13.4 % (ref 11.5–15.5)
WBC: 5.8 10*3/uL (ref 4.0–10.5)

## 2014-06-12 LAB — PROTIME-INR
INR: 1.79 — AB (ref 0.00–1.49)
Prothrombin Time: 21 seconds — ABNORMAL HIGH (ref 11.6–15.2)

## 2014-06-12 SURGERY — LEFT HEART CATHETERIZATION WITH CORONARY ANGIOGRAM

## 2014-06-12 MED ORDER — VERAPAMIL HCL 2.5 MG/ML IV SOLN
INTRAVENOUS | Status: AC
  Filled 2014-06-12: qty 2

## 2014-06-12 MED ORDER — SODIUM CHLORIDE 0.9 % IV SOLN
1.0000 mL/kg/h | INTRAVENOUS | Status: DC
Start: 2014-06-12 — End: 2014-06-12

## 2014-06-12 MED ORDER — LIDOCAINE HCL (PF) 1 % IJ SOLN
INTRAMUSCULAR | Status: AC
  Filled 2014-06-12: qty 30

## 2014-06-12 MED ORDER — FENTANYL CITRATE 0.05 MG/ML IJ SOLN
INTRAMUSCULAR | Status: AC
  Filled 2014-06-12: qty 2

## 2014-06-12 MED ORDER — HEPARIN (PORCINE) IN NACL 2-0.9 UNIT/ML-% IJ SOLN
INTRAMUSCULAR | Status: AC
  Filled 2014-06-12: qty 1000

## 2014-06-12 MED ORDER — SODIUM CHLORIDE 0.9 % IV SOLN
1.0000 mL/kg/h | INTRAVENOUS | Status: DC
Start: 2014-06-13 — End: 2014-06-12
  Administered 2014-06-12: 1 mL/kg/h via INTRAVENOUS

## 2014-06-12 MED ORDER — NITROGLYCERIN 1 MG/10 ML FOR IR/CATH LAB
INTRA_ARTERIAL | Status: AC
  Filled 2014-06-12: qty 10

## 2014-06-12 MED ORDER — HEPARIN SODIUM (PORCINE) 1000 UNIT/ML IJ SOLN
INTRAMUSCULAR | Status: AC
Start: 2014-06-12 — End: 2014-06-12
  Filled 2014-06-12: qty 1

## 2014-06-12 MED ORDER — MIDAZOLAM HCL 2 MG/2ML IJ SOLN
INTRAMUSCULAR | Status: AC
  Filled 2014-06-12: qty 2

## 2014-06-12 NOTE — Progress Notes (Signed)
Family in to see. Ate [place] sandwich.

## 2014-06-12 NOTE — Progress Notes (Signed)
Patient left floor via stretcher, escorted by [facility] and transferred to [facility]. Patient stable at time of transport.

## 2014-06-12 NOTE — Progress Notes (Signed)
Walked to BR to void. Tolerated activity well.

## 2014-06-12 NOTE — H&P (View-Only) (Signed)
Primary Cardiologist: Dr. Quay Burow  Cardiology Specific Problem List: 1. Unstable Angina  2. Hypertension  3. Hx of PE on Coumadin therapy  Subjective:    No chest pain overnight. Tired.  Objective:   Temp:  [97.6 F (36.4 C)] 97.6 F (36.4 C) (02/12 0537) Pulse Rate:  [78-98] 78 (02/12 0537) Resp:  [17-18] 18 (02/12 0537) BP: (116-132)/(71-78) 124/71 mmHg (02/12 0537) SpO2:  [96 %-98 %] 98 % (02/12 0537) Last BM Date: 06/10/14  Filed Weights   06/10/14 1650 06/11/14 0045  Weight: 233 lb (105.688 kg) 230 lb 14.4 oz (104.736 kg)    Intake/Output Summary (Last 24 hours) at 06/12/14 0802 Last data filed at 06/12/14 0539  Gross per 24 hour  Intake   1320 ml  Output   3150 ml  Net  -1830 ml    Telemetry: NSR.  Exam:  General: No acute distress.  Lungs: Clear to auscultation, nonlabored.  Cardiac: No elevated JVP or bruits. RRR, no gallop or rub.   Abdomen: Normoactive bowel sounds, nontender, nondistended.  Extremities: No pitting edema, distal pulses full.   Lab Results:  Basic Metabolic Panel:  Recent Labs Lab 06/10/14 1831 06/11/14 0645 06/12/14 0625  NA 139 139 137  K 3.8 3.3* 3.7  CL 107 105 108  CO2 26 25 25   GLUCOSE 103* 127* 142*  BUN 8 8 8   CREATININE 0.61 0.66 0.66  CALCIUM 8.7 8.6 8.2*    Liver Function Tests:  Recent Labs Lab 06/10/14 1831 06/11/14 0645  AST 25 22  ALT 32 30  ALKPHOS 39 36*  BILITOT 1.0 1.1  PROT 6.5 6.0  ALBUMIN 3.7 3.4*    CBC:  Recent Labs Lab 06/10/14 1831 06/11/14 0645 06/12/14 0625  WBC 8.7 6.2 5.8  HGB 14.0 13.6 13.1  HCT 40.6 39.6 38.3*  MCV 86.0 84.6 84.9  PLT 200 193 196    Cardiac Enzymes:  Recent Labs Lab 06/11/14 0020 06/11/14 0645 06/11/14 1210  TROPONINI <0.03 <0.03 <0.03    Coagulation:  Recent Labs Lab 06/10/14 1831 06/11/14 0645 06/12/14 0625  INR 2.20* 2.07* 1.79*    Radiology: Dg Chest 2 View  06/10/2014   CLINICAL DATA:  Chest pain.  Occasional  shortness of breath.  EXAM: CHEST  2 VIEW  COMPARISON:  02/21/2010  FINDINGS: Heart size and vascularity are normal and the lungs are clear. No effusions. Chronic accentuation of the thoracic kyphosis.  IMPRESSION: No acute abnormalities.   Electronically Signed   By: Lorriane Shire M.D.   On: 06/10/2014 17:32   Ct Angio Chest Pe W/cm &/or Wo Cm  06/10/2014   CLINICAL DATA:  Chest pain for 2 days which radiates into the right arm.  EXAM: CT ANGIOGRAPHY CHEST WITH CONTRAST  TECHNIQUE: Multidetector CT imaging of the chest was performed using the standard protocol during bolus administration of intravenous contrast. Multiplanar CT image reconstructions and MIPs were obtained to evaluate the vascular anatomy.  CONTRAST:  117mL OMNIPAQUE IOHEXOL 350 MG/ML SOLN  COMPARISON:  CT thorax 11/08/2012  FINDINGS: Mediastinum/Nodes: No axillary or supraclavicular lymphadenopathy. No mediastinal hilar lymphadenopathy. No pericardial fluid.  There are no filling defects within the pulmonary arteries to suggest acute pulmonary embolism. No acute findings of the aorta or great vessels. Esophagus is normal.  Lungs/Pleura: No pulmonary infarction. Mild subpleural atelectasis of the right lung base. No pleural fluid or pulmonary edema.  Upper abdomen: Limited view of the liver, kidneys, pancreas are unremarkable. Normal adrenal glands.  Musculoskeletal:  Degenerate spurring of the spine.  Review of the MIP images confirms the above findings.  IMPRESSION: 1. No acute pulmonary embolism. 2. Mild right subpleural atelectasis   Electronically Signed   By: Suzy Bouchard M.D.   On: 06/10/2014 20:48     Medications:   Scheduled Medications: . amLODipine  5 mg Oral Daily  . aspirin EC  81 mg Oral Daily  . carvedilol  6.25 mg Oral BID WC  . losartan  50 mg Oral Daily  . nitroGLYCERIN  1 inch Topical 4 times per day  . pantoprazole  40 mg Oral Daily  . pravastatin  80 mg Oral Daily  . sodium chloride  3 mL Intravenous Q12H     PRN Medications: sodium chloride, acetaminophen **OR** acetaminophen, diazepam, ondansetron **OR** ondansetron (ZOFRAN) IV, sodium chloride   Assessment and Plan:   1. Unstable Angina: Recent chest pain, exertional fatigue and dyspnea. Plan to transfer to [facility] this am for cardiac catheterization. I have discussed this with the patient who is willing to proceed. He will remain NPO and continue to hold his coumadin until post procedure with reinstitution in am. He has not experienced pain overnight. INR 1.7.  2. CAD: Hx of Stent to the RCA: 2007: Residual disease in LAD of 60% at that time. Patent stents in 2008. Conitnue ASA, BB, and statin.   3. Hypertension: Will controlled currently.   4. Hx of DVT and PE: Coumadin on hold for cath today. INR down to 1.7.  Phill Myron. Lawrence NP [place]  06/12/2014, 8:02 AM    Attending note:  Patient seen and examined. Agree with above assessment by Ms. Lawrence NP. No recurrent chest pain and troponin I levels negative. With recent onset chest pain (typical and atypical) as well as exertional fatigue and dyspnea, concern is for unstable angina. Has history of DES x 2 to RCA 2008 and moderate LAD disease managed medically. We discussed proceeding to cardiac catheterization yesterday and he is in agreement. Coumadin held with INR down to 1.7 today. He is being transferred to [facility] today. The history of DVT/PE was in July 2014 - not clear that he will need Lovenox bridge back on Coumadin after heart catheterization.  Satira Sark, M.D., F.A.C.C.

## 2014-06-12 NOTE — Progress Notes (Signed)
Pt transferred to cath lab for procedure  Via stretcher.

## 2014-06-12 NOTE — Discharge Instructions (Signed)
Radial Site Care °Refer to this sheet in the next few weeks. These instructions provide you with information on caring for yourself after your procedure. Your caregiver may also give you more specific instructions. Your treatment has been planned according to current medical practices, but problems sometimes occur. Call your caregiver if you have any problems or questions after your procedure. °HOME CARE INSTRUCTIONS °· You may shower the day after the procedure. Remove the bandage (dressing) and gently wash the site with plain soap and water. Gently pat the site dry. °· Do not apply powder or lotion to the site. °· Do not submerge the affected site in water for 3 to 5 days. °· Inspect the site at least twice daily. °· Do not flex or bend the affected arm for 24 hours. °· No lifting over 5 pounds (2.3 kg) for 5 days after your procedure. °· Do not drive home if you are discharged the same day of the procedure. Have someone else drive you. °· You may drive 24 hours after the procedure unless otherwise instructed by your caregiver. °· Do not operate machinery or power tools for 24 hours. °· A responsible adult should be with you for the first 24 hours after you arrive home. °What to expect: °· Any bruising will usually fade within 1 to 2 weeks. °· Blood that collects in the tissue (hematoma) may be painful to the touch. It should usually decrease in size and tenderness within 1 to 2 weeks. °SEEK IMMEDIATE MEDICAL CARE IF: °· You have unusual pain at the radial site. °· You have redness, warmth, swelling, or pain at the radial site. °· You have drainage (other than a small amount of blood on the dressing). °· You have chills. °· You have a fever or persistent symptoms for more than 72 hours. °· You have a fever and your symptoms suddenly get worse. °· Your arm becomes pale, cool, tingly, or numb. °· You have heavy bleeding from the site. Hold pressure on the site. °Document Released: 05/20/2010 Document Revised:  07/10/2011 Document Reviewed: 05/20/2010 °ExitCare® Patient Information ©2015 [employer]. This information is not intended to replace advice given to you by your health care provider. Make sure you discuss any questions you have with your health care provider. ° °

## 2014-06-12 NOTE — Progress Notes (Signed)
Report given to [facility] via phone. 

## 2014-06-12 NOTE — Interval H&P Note (Signed)
History and Physical Interval Note:  06/12/2014 12:29 PM  Steven Oneal  has presented today for surgery, with the diagnosis of Unstable Angina. The various methods of treatment have been discussed with the patient and family. After consideration of risks, benefits and other options for treatment, the patient has consented to  Procedure(s): LEFT HEART CATHETERIZATION WITH CORONARY ANGIOGRAM (N/A) +/- PCI  as a surgical intervention .  The patient's history has been reviewed, patient examined, no change in status, stable for surgery.  I have reviewed the patient's chart and labs.  Questions were answered to the patient's satisfaction.    Cath Lab Visit (complete for each Cath Lab visit)  Clinical Evaluation Leading to the Procedure:   ACS: Yes.    Non-ACS:    Anginal Classification: CCS IV  Anti-ischemic medical therapy: Maximal Therapy (2 or more classes of medications)  Non-Invasive Test Results: No non-invasive testing performed  Prior CABG: No previous CABG   HARDING, DAVID W

## 2014-06-12 NOTE — Progress Notes (Signed)
Per Dr Ellyn Hack ok to d/c home now

## 2014-06-12 NOTE — Discharge Summary (Signed)
Physician Discharge Summary  Patient ID: Steven Oneal MRN: [id number] DOB/AGE: Jan 19, 1946 69 y.o.   Primary Cardiologist: Dr. [name]  Admit date: 06/10/2014 Discharge date: 06/12/2014  Admission Diagnoses: Class III Angina  Discharge Diagnoses:  Principal Problem:   Non cardiac CP Active Problems:   GERD (gastroesophageal reflux disease)   DVT (deep venous thrombosis)   Pulmonary embolus   Coronary artery disease- RCA PCI in 2007; last cath 06/2014 w/ minimal CAD w/ widely patent RCA stents   Essential hypertension   Hyperlipidemia   Chronic anticoagulation for PE, DVT   Discharged Condition: stable  Hospital Course: Steven Oneal is a 69 y.o. male with prior inferior STEMI in 2007 with stents to the ostial and mid RCA. He was noted to have moderate nonobstructive residual LAD disease by last cardiac catheterization in 2008. Other history includes, HTN, HLD and prior history of DVT and PE, for which he is on chronic Coumadin therapy. He is followed by Dr. [name].   He was admitted to [facility] 06/11/14 after presenting with complaints of chest pain. He was evaluated by Dr. Domenic Polite. The patient noted experiencing recurring chest discomfort, fairly atypical in description, although associated with exertional fatigue and shortness of breath. Also feeling of palpitations, although no arrhythmias were noted on telemetry. Cardiac markers were cycled and were negative x 3. ECG was nonspecific. Chest CT scan showed no evidence of recurrent pulmonary embolus. He reported compliance with Coumadin. INR on admit was therapeutic at 2.0. Patient noted that he definitely has felt less stamina which is unusual for him, and he had concerns that symptoms were cardiac related.  Review of records revealed last ischemic evaluation was via [place] in January 2015 revealing inferobasal scar with mild inferolateral hypokinesis. Dr. Domenic Polite discussed options for follow-up cardiac testing including  both noninvasive and invasive techniques. After reviewing the risks and benefits, the patient was in agreement to proceeding with a diagnostic heart catheterization to clearly evaluate coronary anatomy and assess for revascularization options.   He was subsequently transferred to [facility] for [place] on 06/12/14. The procedure was performed by Dr. Ellyn Hack. Access was obtained via the right radial artery. He was found to have angiographically minimal CAD with widely patent RCA stents (full angiographic details listed below). LVEF was well preserved at 60-65% and EDP was normal. His symptoms were felt to be noncardiac. Continuation of medical therapy was recommended. He was monitored post cath and had no complications. Dr. Ellyn Hack felt that he was stable for discharge. He was continued on ASA, Coreg, losartan, pravastatin and Coumadin. Post hospital f/u has been arranged with [name], PA-C, 07/06/13.     Consults: None  Significant Diagnostic Studies:  LHC 06/12/14 FINDINGS:  Hemodynamics:   Central Aortic Pressure / Mean: 104/66/84 mmHg  Left Ventricular Pressure / LVEDP: 106/4/7 mmHg  Left Ventriculography:  EF: 60-65% %  Wall Motion: Normal  Coronary Anatomy:  Dominance: Right  Left Main: Large-caliber vessel that trifurcates into the LAD, Circumflex, and Ramus Intermedius. Angiographically normal LAD: Large-caliber vessel with diffuse 20-30% proximal stenosis. The vessel then take Cipro 3 tortuous course and reaches down around the apex perfusing the inferoapex. There are mild diffuse luminal irregularities throughout. The apex has a distal bifurcation and just prior to that there is a eccentric diffuse 30-40% stenosis.  D1: Large-caliber branch that comes off the mid LAD. There is roughly 40% ostial stenosis.  Left Circumflex: Large-caliber vessel that gives off a very small AV groove branch that terminates distally in  the AV groove. The main body of the circumflex and continues as a  large lateral OM branch that bifurcates into 2 medium caliber branches with the inferior 1 bifurcating immediately to 2 small branches. The lateral OM has a proximal eccentric 30% stenosis.  Ramus intermedius: Large-caliber branch that courses right between the OM and LAD following to the apex. Minimal luminal irregularities.   RCA: Large caliber, dominant vessel with widely patent ostial and mid stent that has maybe 20% in-stent restenosis. There is a small to medium-sized RV marginal branch that has an ostial 80-90% stenosis but is not PCI amenable. Distally the RCA bifurcates into the Right Posterior AV Groove Branch (RPAV) and the Right Posterior Descending Artery (RPDA), minimal disease distally.  RPDA: Moderate caliber vessel which reaches two thirds the way to the apex. Minimal luminal irregularities.  RPL Sysytem:The RPAV begins as a moderate caliber vessel that terminates with a near trifurcation into 3 small PL branches.    Treatments: See Hospital Course  Discharge Exam: Blood pressure 131/66, pulse 89, temperature 97.6 F (36.4 C), temperature source Oral, resp. rate 27, height 5\' 9"  (1.753 m), weight 230 lb 14.4 oz (104.736 kg), SpO2 95 %.   Disposition: 01-Home or Self Care      Discharge Instructions    Diet - low sodium heart healthy    Complete by:  As directed      Increase activity slowly    Complete by:  As directed             Medication List    TAKE these medications        amLODipine 5 MG tablet  Commonly known as:  NORVASC  TAKE ONE TABLET BY MOUTH ONCE DAILY     aspirin EC 81 MG tablet  Take 81 mg by mouth daily.     carvedilol 6.25 MG tablet  Commonly known as:  COREG  Take 1 tablet (6.25 mg total) by mouth 2 (two) times daily.     carvedilol 6.25 MG tablet  Commonly known as:  COREG  Take 1 tablet (6.25 mg total) by mouth 2 (two) times daily with a meal.     diazepam 10 MG tablet  Commonly known as:  VALIUM  Take 10 mg by mouth  every 6 (six) hours as needed for anxiety.     fish oil-omega-3 fatty acids 1000 MG capsule  Take 3 g by mouth daily.     losartan 50 MG tablet  Commonly known as:  COZAAR  Take 1 tablet (50 mg total) by mouth daily.     meclizine 25 MG tablet  Commonly known as:  ANTIVERT  Take 25 mg by mouth as needed for dizziness.     multivitamin with minerals Tabs tablet  Take 1 tablet by mouth daily.     omeprazole 20 MG capsule  Commonly known as:  PRILOSEC  Take 20 mg by mouth daily.     oxyCODONE-acetaminophen 7.5-325 MG per tablet  Commonly known as:  PERCOCET  Take 1-2 tablets by mouth every 4 (four) hours as needed.     pravastatin 80 MG tablet  Commonly known as:  PRAVACHOL  Take 1 tablet (80 mg total) by mouth daily.     warfarin 5 MG tablet  Commonly known as:  COUMADIN  Take 5 mg by mouth as directed. Per coumadin clinic - Saturday and Sunday     warfarin 4 MG tablet  Commonly known as:  COUMADIN  Take 1 tablet  by mouth as directed. Per coumadin clinic - Monday through Friday       Follow-up Information    Follow up with Erlene Quan, PA-C On 07/07/2014.   Specialty:  Cardiology   Why:  11:00 am (Dr. Kennon Holter PA)   Contact information:   [address] [place] [address] [place], INCLUDING PHYSICIAN TIME: >30 MINUTES   Signed: Lyda Jester 06/12/2014, 3:05 PM

## 2014-06-12 NOTE — Progress Notes (Signed)
Primary Cardiologist: Dr. Quay Burow  Cardiology Specific Problem List: 1. Unstable Angina  2. Hypertension  3. Hx of PE on Coumadin therapy  Subjective:    No chest pain overnight. Tired.  Objective:   Temp:  [97.6 F (36.4 C)] 97.6 F (36.4 C) (02/12 0537) Pulse Rate:  [78-98] 78 (02/12 0537) Resp:  [17-18] 18 (02/12 0537) BP: (116-132)/(71-78) 124/71 mmHg (02/12 0537) SpO2:  [96 %-98 %] 98 % (02/12 0537) Last BM Date: 06/10/14  Filed Weights   06/10/14 1650 06/11/14 0045  Weight: 233 lb (105.688 kg) 230 lb 14.4 oz (104.736 kg)    Intake/Output Summary (Last 24 hours) at 06/12/14 0802 Last data filed at 06/12/14 0539  Gross per 24 hour  Intake   1320 ml  Output   3150 ml  Net  -1830 ml    Telemetry: NSR.  Exam:  General: No acute distress.  Lungs: Clear to auscultation, nonlabored.  Cardiac: No elevated JVP or bruits. RRR, no gallop or rub.   Abdomen: Normoactive bowel sounds, nontender, nondistended.  Extremities: No pitting edema, distal pulses full.   Lab Results:  Basic Metabolic Panel:  Recent Labs Lab 06/10/14 1831 06/11/14 0645 06/12/14 0625  NA 139 139 137  K 3.8 3.3* 3.7  CL 107 105 108  CO2 26 25 25   GLUCOSE 103* 127* 142*  BUN 8 8 8   CREATININE 0.61 0.66 0.66  CALCIUM 8.7 8.6 8.2*    Liver Function Tests:  Recent Labs Lab 06/10/14 1831 06/11/14 0645  AST 25 22  ALT 32 30  ALKPHOS 39 36*  BILITOT 1.0 1.1  PROT 6.5 6.0  ALBUMIN 3.7 3.4*    CBC:  Recent Labs Lab 06/10/14 1831 06/11/14 0645 06/12/14 0625  WBC 8.7 6.2 5.8  HGB 14.0 13.6 13.1  HCT 40.6 39.6 38.3*  MCV 86.0 84.6 84.9  PLT 200 193 196    Cardiac Enzymes:  Recent Labs Lab 06/11/14 0020 06/11/14 0645 06/11/14 1210  TROPONINI <0.03 <0.03 <0.03    Coagulation:  Recent Labs Lab 06/10/14 1831 06/11/14 0645 06/12/14 0625  INR 2.20* 2.07* 1.79*    Radiology: Dg Chest 2 View  06/10/2014   CLINICAL DATA:  Chest pain.  Occasional  shortness of breath.  EXAM: CHEST  2 VIEW  COMPARISON:  02/21/2010  FINDINGS: Heart size and vascularity are normal and the lungs are clear. No effusions. Chronic accentuation of the thoracic kyphosis.  IMPRESSION: No acute abnormalities.   Electronically Signed   By: Lorriane Shire M.D.   On: 06/10/2014 17:32   Ct Angio Chest Pe W/cm &/or Wo Cm  06/10/2014   CLINICAL DATA:  Chest pain for 2 days which radiates into the right arm.  EXAM: CT ANGIOGRAPHY CHEST WITH CONTRAST  TECHNIQUE: Multidetector CT imaging of the chest was performed using the standard protocol during bolus administration of intravenous contrast. Multiplanar CT image reconstructions and MIPs were obtained to evaluate the vascular anatomy.  CONTRAST:  139mL OMNIPAQUE IOHEXOL 350 MG/ML SOLN  COMPARISON:  CT thorax 11/08/2012  FINDINGS: Mediastinum/Nodes: No axillary or supraclavicular lymphadenopathy. No mediastinal hilar lymphadenopathy. No pericardial fluid.  There are no filling defects within the pulmonary arteries to suggest acute pulmonary embolism. No acute findings of the aorta or great vessels. Esophagus is normal.  Lungs/Pleura: No pulmonary infarction. Mild subpleural atelectasis of the right lung base. No pleural fluid or pulmonary edema.  Upper abdomen: Limited view of the liver, kidneys, pancreas are unremarkable. Normal adrenal glands.  Musculoskeletal:  Degenerate spurring of the spine.  Review of the MIP images confirms the above findings.  IMPRESSION: 1. No acute pulmonary embolism. 2. Mild right subpleural atelectasis   Electronically Signed   By: Suzy Bouchard M.D.   On: 06/10/2014 20:48     Medications:   Scheduled Medications: . amLODipine  5 mg Oral Daily  . aspirin EC  81 mg Oral Daily  . carvedilol  6.25 mg Oral BID WC  . losartan  50 mg Oral Daily  . nitroGLYCERIN  1 inch Topical 4 times per day  . pantoprazole  40 mg Oral Daily  . pravastatin  80 mg Oral Daily  . sodium chloride  3 mL Intravenous Q12H     PRN Medications: sodium chloride, acetaminophen **OR** acetaminophen, diazepam, ondansetron **OR** ondansetron (ZOFRAN) IV, sodium chloride   Assessment and Plan:   1. Unstable Angina: Recent chest pain, exertional fatigue and dyspnea. Plan to transfer to [facility] this am for cardiac catheterization. I have discussed this with the patient who is willing to proceed. He will remain NPO and continue to hold his coumadin until post procedure with reinstitution in am. He has not experienced pain overnight. INR 1.7.  2. CAD: Hx of Stent to the RCA: 2007: Residual disease in LAD of 60% at that time. Patent stents in 2008. Conitnue ASA, BB, and statin.   3. Hypertension: Will controlled currently.   4. Hx of DVT and PE: Coumadin on hold for cath today. INR down to 1.7.  Phill Myron. Lawrence NP [place]  06/12/2014, 8:02 AM    Attending note:  Patient seen and examined. Agree with above assessment by Ms. Lawrence NP. No recurrent chest pain and troponin I levels negative. With recent onset chest pain (typical and atypical) as well as exertional fatigue and dyspnea, concern is for unstable angina. Has history of DES x 2 to RCA 2008 and moderate LAD disease managed medically. We discussed proceeding to cardiac catheterization yesterday and he is in agreement. Coumadin held with INR down to 1.7 today. He is being transferred to [facility] today. The history of DVT/PE was in July 2014 - not clear that he will need Lovenox bridge back on Coumadin after heart catheterization.  Satira Sark, M.D., F.A.C.C.

## 2014-06-12 NOTE — Progress Notes (Signed)
ANTICOAGULATION CONSULT NOTE - Initial Consult  Pharmacy Consult for coumadin Indication: PE, DVT  Allergies  Allergen Reactions  . Codeine Itching, Nausea Only and Rash    Patient Measurements: Height: 5\' 9"  (175.3 cm) Weight: 230 lb 14.4 oz (104.736 kg) IBW/kg (Calculated) : 70.7 Heparin Dosing Weight:   Vital Signs: Temp: 96.9 F (36.1 C) (02/12 1511) Temp Source: Oral (02/12 1511) BP: 131/66 mmHg (02/12 1450) Pulse Rate: 89 (02/12 1450)  Labs:  Recent Labs  06/10/14 1831  06/11/14 0020 06/11/14 0645 06/11/14 1210 06/12/14 0625  HGB 14.0  --   --  13.6  --  13.1  HCT 40.6  --   --  39.6  --  38.3*  PLT 200  --   --  193  --  196  LABPROT 24.6*  --   --  23.5*  --  21.0*  INR 2.20*  --   --  2.07*  --  1.79*  CREATININE 0.61  --   --  0.66  --  0.66  TROPONINI <0.03  < > <0.03 <0.03 <0.03  --   < > = values in this interval not displayed.  Estimated Creatinine Clearance: 105.4 mL/min (by C-G formula based on Cr of 0.66).   Medical History: Past Medical History  Diagnosis Date  . Allergic rhinitis   . GERD (gastroesophageal reflux disease)   . Essential hypertension   . Hyperlipidemia   . History of DVT (deep vein thrombosis) July 2014  . History of pulmonary embolus (PE) July 2014  . On continuous oral anticoagulation   . CAD (coronary artery disease)     DES Cypher x 2 RCA 2007; Repeat LHC 06/2014 angiographically minimal CAD with widely patent RCA stents EF 60-65%   . Myocardial infarction 2007  . Vertigo     Medications:  Scheduled:  . [MAR Hold] amLODipine  5 mg Oral Daily  . [MAR Hold] aspirin EC  81 mg Oral Daily  . [MAR Hold] carvedilol  6.25 mg Oral BID WC  . [MAR Hold] losartan  50 mg Oral Daily  . [MAR Hold] nitroGLYCERIN  1 inch Topical 4 times per day  . [MAR Hold] pantoprazole  40 mg Oral Daily  . [MAR Hold] pravastatin  80 mg Oral Daily  . [MAR Hold] sodium chloride  3 mL Intravenous Q12H   Infusions:  . [START ON 06/13/2014]  sodium chloride 1 mL/kg/hr (06/12/14 1031)  . sodium chloride 1 mL/kg/hr (06/12/14 1322)    Assessment: 69 yo who was tx here for cath today. Cath showed minimal CAD. He has been on coumadin for a hx PE/DVT. INR was 2.07 on admission and today came back as 1.79. Looks like plan to dc today.   PTA coumadin = 4mg  qday except 5mg  Sat, Sun  Goal of Therapy:  INR 2-3 Monitor platelets by anticoagulation protocol: Yes   Plan:   Resume home coumadin Daily INR if still here  Onnie Boer, PharmD Pager: [phone] 06/12/2014 3:37 PM

## 2014-06-12 NOTE — CV Procedure (Signed)
CARDIAC CATHETERIZATION REPORT  NAME:  Steven Oneal   MRN: [id number] DOB:  1946/02/23   ADMIT DATE: 06/10/2014 Procedure Date: 06/12/2014  INTERVENTIONAL CARDIOLOGIST: Leonie Man, M.D., MS PRIMARY CARE PROVIDER: Glo Herring., MD PRIMARY CARDIOLOGIST: Lorretta Harp., MD  PATIENT:  Steven Oneal is a 69 y.o. male with prior inferior STEMI in 2007 with stents to the ostial and mid RCA. He has had at least one more cath since that time and was found to have moderate disease in the LAD. He now presents with symptoms that are concerning for unstable angina. He is referred by Dr. Johnny Bridge for invasive evaluation for possible unstable angina.   PRE-OPERATIVE DIAGNOSIS:    Unstable Angina  Prior Inferior STEMI - Ostial & Mid RCA PCI in 2007  PROCEDURES PERFORMED:    Left Heart Catheterization with Native Coronary Angiography  via Right Radial Artery   Left Ventriculography  PROCEDURE: The patient was brought to the 2nd [facility] in the fasting state and prepped and draped in the usual sterile fashion for Right Radial  artery access. A modified Allen's test was performed on the Right wrist demonstrating excellent collateral flow for radial access.   Sterile technique was used including antiseptics, cap, gloves, gown, hand hygiene, mask and sheet. Skin prep: Chlorhexidine.   Consent: Risks of procedure as well as the alternatives and risks of each were explained to the (patient/caregiver). Consent for procedure obtained.   Time Out: Verified patient identification, verified procedure, site/side was marked, verified correct patient position, special equipment/implants available, medications/allergies/relevent history reviewed, required imaging and test results available. Performed.  Access:   Right Radial Artery: 6 Fr Sheath -  Seldinger Technique (Angiocath Micropuncture Kit)  Radial Cocktail - 10 mL; IV Heparin 5000  Units   Left Heart Catheterization: 5 Fr Catheters advanced or exchanged over a Long-Exchange Safety J-wire under fluoroscopic guidance; TIG 4.0 catheter advanced first. Following angiography, the catheter was removed out of the body over wire without difficulty. Right Coronary Artery Cineangiography: TIG 4.0 Catheter  Left Coronary Artery Cineangiography: JL 3.5 Catheter   LV Hemodynamics (LV Gram): TIG 4.0, hand injection  Sheath removed in the cardiac catheterization lab with TR band placement for hemostasis.  TR Band: 1305  Hours; 12 mL air  FINDINGS:  Hemodynamics:   Central Aortic Pressure / Mean: 104/66/84 mmHg  Left Ventricular Pressure / LVEDP: 106/4/7 mmHg  Left Ventriculography:  EF: 60-65% %  Wall Motion: Normal  Coronary Anatomy:  Dominance: Right  Left Main: Large-caliber vessel that trifurcates into the LAD, Circumflex, and Ramus Intermedius. Angiographically normal LAD: Large-caliber vessel with diffuse 20-30% proximal stenosis. The vessel then take Cipro 3 tortuous course and reaches down around the apex perfusing the inferoapex. There are mild diffuse luminal irregularities throughout. The apex has a distal bifurcation and just prior to that there is a eccentric diffuse 30-40% stenosis.  D1: Large-caliber branch that comes off the mid LAD. There is roughly 40% ostial stenosis.  Left Circumflex: Large-caliber vessel that gives off a very small AV groove branch that terminates distally in the AV groove.  The main body of the circumflex and continues as a large lateral OM branch that bifurcates into 2 medium caliber branches with the inferior 1 bifurcating immediately to 2 small branches. The lateral OM has a proximal eccentric 30% stenosis.  Ramus intermedius: Large-caliber branch that courses right between the OM and LAD following to the apex. Minimal luminal irregularities.  RCA: Large caliber, dominant vessel with widely patent ostial and mid stent that has  maybe 20% in-stent restenosis.  There is a small to medium-sized RV marginal branch that has an ostial 80-90% stenosis but is not PCI amenable. Distally the RCA bifurcates into the Right Posterior AV Groove Branch (RPAV) and the Right Posterior Descending Artery (RPDA), minimal disease distally.  RPDA:  Moderate caliber vessel which reaches two thirds the way to the apex. Minimal luminal irregularities.  RPL Sysytem:The RPAV  begins as a moderate caliber vessel that terminates with a near trifurcation into 3 small PL branches.  MEDICATIONS:  Anesthesia:  Local Lidocaine 74m  Sedation:  2 mg IV Versed, 75 mcg IV fentanyl ;   Omnipaque Contrast: 80 ml  Anticoagulation:  IV Heparin 5000 Units  Radial Cocktail: 5 mg Verapamil, 400 mcg NTG, 2 ml 2% Lidocaine in 10 ml NS  PATIENT DISPOSITION:    The patient was transferred to the PACU holding area in a hemodynamicaly stable, chest pain free condition.  The patient tolerated the procedure well, and there were no complications.  EBL:   < 10 ml  The patient was stable before, during, and after the procedure.  POST-OPERATIVE DIAGNOSIS:    Angiographically minimal CAD with widely patent RCA stents.   Well-preserved LVEF with normal EDP  Unable to explain the patient's exertional chest pain and dyspnea based on these results  PLAN OF CARE:  The patient will be transferred to the nursing unit for post radial cath care. I would anticipate discharge later on this afternoon.  Continue medical management, and consider non-cardiac etiology for symptoms.    HLeonie Man M.D., M.S. Interventional Cardiologist   Pager # [phone]

## 2014-06-15 ENCOUNTER — Encounter: Payer: Self-pay | Admitting: *Deleted

## 2014-06-26 DIAGNOSIS — Z7901 Long term (current) use of anticoagulants: Secondary | ICD-10-CM | POA: Diagnosis not present

## 2014-07-07 ENCOUNTER — Ambulatory Visit (INDEPENDENT_AMBULATORY_CARE_PROVIDER_SITE_OTHER): Payer: Medicare Other | Admitting: Cardiology

## 2014-07-07 ENCOUNTER — Encounter: Payer: Self-pay | Admitting: Cardiology

## 2014-07-07 VITALS — BP 118/80 | HR 90 | Ht 69.0 in | Wt 236.2 lb

## 2014-07-07 DIAGNOSIS — I2510 Atherosclerotic heart disease of native coronary artery without angina pectoris: Secondary | ICD-10-CM | POA: Diagnosis not present

## 2014-07-07 DIAGNOSIS — E785 Hyperlipidemia, unspecified: Secondary | ICD-10-CM

## 2014-07-07 DIAGNOSIS — R0683 Snoring: Secondary | ICD-10-CM

## 2014-07-07 DIAGNOSIS — R0681 Apnea, not elsewhere classified: Secondary | ICD-10-CM

## 2014-07-07 DIAGNOSIS — Z008 Encounter for other general examination: Secondary | ICD-10-CM

## 2014-07-07 DIAGNOSIS — I10 Essential (primary) hypertension: Secondary | ICD-10-CM | POA: Diagnosis not present

## 2014-07-07 DIAGNOSIS — I208 Other forms of angina pectoris: Secondary | ICD-10-CM | POA: Diagnosis not present

## 2014-07-07 DIAGNOSIS — E669 Obesity, unspecified: Secondary | ICD-10-CM | POA: Insufficient documentation

## 2014-07-07 DIAGNOSIS — Z9861 Coronary angioplasty status: Secondary | ICD-10-CM

## 2014-07-07 DIAGNOSIS — Z0189 Encounter for other specified special examinations: Secondary | ICD-10-CM

## 2014-07-07 DIAGNOSIS — I209 Angina pectoris, unspecified: Secondary | ICD-10-CM

## 2014-07-07 MED ORDER — METOPROLOL TARTRATE 25 MG PO TABS: 12.5000 mg | ORAL_TABLET | Freq: Two times a day (BID) | ORAL | Status: DC

## 2014-07-07 NOTE — Assessment & Plan Note (Signed)
On statin Rx, LDL 67 in Jan 2016

## 2014-07-07 NOTE — Assessment & Plan Note (Signed)
LLE DVT and PE July 2014. No obviuos instigating event, on Coumadin since then

## 2014-07-07 NOTE — Assessment & Plan Note (Signed)
No further chest pain

## 2014-07-07 NOTE — Assessment & Plan Note (Signed)
He has some symptoms of sleep apnea- daytime fatigue, snoring, obesity, HTN

## 2014-07-07 NOTE — Progress Notes (Signed)
07/07/2014 Steven Oneal   1945-06-17  [id number]  Primary Physician Glo Herring., MD Primary Cardiologist: Dr [name]  HPI:  69 y/o male who has I last saw one year ago who has a history of CAD status post PCI and stenting of his RCA by Dr [name] Feb 2007 in the setting of an MI. He was restudied in June 2008, and had patent stents in his proximal RCA and noncritical disease in his proximal LAD with normal LV function. He did have a PE/DVT in July 2014 and has been on  Coumadin since.  A Myoview stress test performed 05/08/13 showed inferobasal scar with mild inferolateral hypokinesis unchanged from prior functional studies.            The pt was recently admitted with chest pain and DOE. Cath done 06/12/14 showed patent coronaries. He is here for follow up. He denies chest pain. He admits to daytime fatigue and snoring. He doesn't sleep well and has "night sweats". He denies orthopnea or edema.              Current Outpatient Prescriptions  Medication Sig Dispense Refill  . amLODipine (NORVASC) 5 MG tablet TAKE ONE TABLET BY MOUTH ONCE DAILY 90 tablet 1  . aspirin EC 81 MG tablet Take 81 mg by mouth daily.    . diazepam (VALIUM) 10 MG tablet Take 10 mg by mouth every 6 (six) hours as needed for anxiety.     . fish oil-omega-3 fatty acids 1000 MG capsule Take 3 g by mouth daily.      Marland Kitchen losartan (COZAAR) 50 MG tablet Take 1 tablet (50 mg total) by mouth daily. 90 tablet 2  . meclizine (ANTIVERT) 25 MG tablet Take 25 mg by mouth as needed for dizziness.     . Multiple Vitamin (MULTIVITAMIN WITH MINERALS) TABS tablet Take 1 tablet by mouth daily.    Marland Kitchen omeprazole (PRILOSEC) 20 MG capsule Take 20 mg by mouth daily.      . pravastatin (PRAVACHOL) 80 MG tablet Take 1 tablet (80 mg total) by mouth daily. 90 tablet 2  . warfarin (COUMADIN) 4 MG tablet Take 1 tablet by mouth as directed. Per coumadin clinic - Monday through Friday    . warfarin (COUMADIN) 5 MG tablet Take 5 mg by mouth as  directed. Per coumadin clinic - Saturday and Sunday    . metoprolol tartrate (LOPRESSOR) 25 MG tablet Take 0.5 tablets (12.5 mg total) by mouth 2 (two) times daily. 30 tablet 11   No current facility-administered medications for this visit.    Allergies  Allergen Reactions  . Codeine Itching, Nausea Only and Rash    History   Social History  . Marital Status: Married    Spouse Name: N/A  . Number of Children: 2  . Years of Education: N/A   Occupational History  . disablity     CAD   Social History Main Topics  . Smoking status: Former Smoker -- 0.10 packs/day for 13 years    Types: Cigarettes    Quit date: 05/27/1977  . Smokeless tobacco: Never Used  . Alcohol Use: No  . Drug Use: No  . Sexual Activity:    Partners: Female    Museum/gallery curator: None     Comment: spouse   Other Topics Concern  . Not on file   Social History Narrative     Review of Systems: General: negative for chills, fever, night sweats or weight changes.  Cardiovascular:  negative for chest pain, dyspnea on exertion, edema, orthopnea, palpitations, paroxysmal nocturnal dyspnea or shortness of breath Dermatological: negative for rash Respiratory: negative for cough or wheezing Urologic: negative for hematuria Abdominal: negative for nausea, vomiting, diarrhea, bright red blood per rectum, melena, or hematemesis Neurologic: negative for visual changes, syncope, or dizziness All other systems reviewed and are otherwise negative except as noted above.    Blood pressure 118/80, pulse 90, height 5\' 9"  (1.753 m), weight 236 lb 3.2 oz (107.14 kg).  General appearance: alert, cooperative, no distress and moderately obese Neck: no carotid bruit and no JVD Lungs: clear to auscultation bilaterally Heart: regular rate and rhythm Abdomen: truncal obesity Extremities: no edema  EKG NSR  ASSESSMENT AND PLAN:   Coronary artery disease- RCA PCI in 2007; last cath 06/2014 w/ minimal CAD w/ widely  patent RCA stents No further chest pain   Chronic anticoagulation for PE, DVT LLE DVT and PE July 2014. No obviuos instigating event, on Coumadin since then   Essential hypertension Controlled   Hyperlipidemia On statin Rx, LDL 67 in Jan 2016   Obesity (BMI 30.0-34.9) He has some symptoms of sleep apnea- daytime fatigue, snoring, obesity, HTN    PLAN  I added low dose Metoprolol to his medications. I will ask Dr [name] about his Coumadin Rx- ? Chronic. I did order a sleep study.   Sofie Schendel KPA-C 07/07/2014 11:26 AM

## 2014-07-07 NOTE — Patient Instructions (Addendum)
Your physician has recommended that you have a sleep study. This test records several body functions during sleep, including: brain activity, eye movement, oxygen and carbon dioxide blood levels, heart rate and rhythm, breathing rate and rhythm, the flow of air through your mouth and nose, snoring, body muscle movements, and chest and belly movement. This will be done at [place] [facility]. They will call you with the appointment and details.  Your physician recommends that you schedule a follow-up appointment in: 3 months with Dr. [name].  Your physician has recommended you make the following change in your medication: start new prescription for lopressor.

## 2014-07-07 NOTE — Assessment & Plan Note (Signed)
Controlled.  

## 2014-07-10 ENCOUNTER — Emergency Department (HOSPITAL_COMMUNITY): Payer: Medicare Other

## 2014-07-10 ENCOUNTER — Encounter (HOSPITAL_COMMUNITY): Payer: Self-pay | Admitting: *Deleted

## 2014-07-10 DIAGNOSIS — I2510 Atherosclerotic heart disease of native coronary artery without angina pectoris: Secondary | ICD-10-CM | POA: Diagnosis not present

## 2014-07-10 DIAGNOSIS — I252 Old myocardial infarction: Secondary | ICD-10-CM | POA: Insufficient documentation

## 2014-07-10 DIAGNOSIS — Z7901 Long term (current) use of anticoagulants: Secondary | ICD-10-CM | POA: Diagnosis not present

## 2014-07-10 DIAGNOSIS — E785 Hyperlipidemia, unspecified: Secondary | ICD-10-CM | POA: Insufficient documentation

## 2014-07-10 DIAGNOSIS — K219 Gastro-esophageal reflux disease without esophagitis: Secondary | ICD-10-CM | POA: Diagnosis not present

## 2014-07-10 DIAGNOSIS — Z87891 Personal history of nicotine dependence: Secondary | ICD-10-CM | POA: Insufficient documentation

## 2014-07-10 DIAGNOSIS — I10 Essential (primary) hypertension: Secondary | ICD-10-CM | POA: Diagnosis not present

## 2014-07-10 DIAGNOSIS — Z79899 Other long term (current) drug therapy: Secondary | ICD-10-CM | POA: Diagnosis not present

## 2014-07-10 DIAGNOSIS — Z86711 Personal history of pulmonary embolism: Secondary | ICD-10-CM | POA: Insufficient documentation

## 2014-07-10 DIAGNOSIS — R0602 Shortness of breath: Secondary | ICD-10-CM | POA: Diagnosis not present

## 2014-07-10 DIAGNOSIS — J111 Influenza due to unidentified influenza virus with other respiratory manifestations: Secondary | ICD-10-CM | POA: Insufficient documentation

## 2014-07-10 DIAGNOSIS — Z86718 Personal history of other venous thrombosis and embolism: Secondary | ICD-10-CM | POA: Insufficient documentation

## 2014-07-10 DIAGNOSIS — Z7982 Long term (current) use of aspirin: Secondary | ICD-10-CM | POA: Insufficient documentation

## 2014-07-10 DIAGNOSIS — R05 Cough: Secondary | ICD-10-CM | POA: Diagnosis not present

## 2014-07-10 DIAGNOSIS — R509 Fever, unspecified: Secondary | ICD-10-CM | POA: Diagnosis not present

## 2014-07-10 DIAGNOSIS — F17210 Nicotine dependence, cigarettes, uncomplicated: Secondary | ICD-10-CM | POA: Diagnosis not present

## 2014-07-10 MED ORDER — ACETAMINOPHEN 500 MG PO TABS
1000.0000 mg | ORAL_TABLET | Freq: Once | ORAL | Status: AC
  Administered 2014-07-10: 1000 mg via ORAL
  Filled 2014-07-10: qty 2

## 2014-07-10 NOTE — ED Notes (Signed)
Pt states he mowed his yard yesterday and when he wen to bed, he began feeling sick. Pt states he has bodyaches, earache, feels like head is stopped up and has a cough. Pt states his chest hurts when he coughs.

## 2014-07-11 ENCOUNTER — Emergency Department (HOSPITAL_COMMUNITY)
Admission: EM | Admit: 2014-07-11 | Discharge: 2014-07-11 | Disposition: A | Discharge status: Home / Self Care | Payer: Medicare Other | Attending: Emergency Medicine | Admitting: Emergency Medicine

## 2014-07-11 DIAGNOSIS — R69 Illness, unspecified: Secondary | ICD-10-CM

## 2014-07-11 DIAGNOSIS — J111 Influenza due to unidentified influenza virus with other respiratory manifestations: Secondary | ICD-10-CM | POA: Diagnosis not present

## 2014-07-11 LAB — BASIC METABOLIC PANEL
Anion gap: 8 (ref 5–15)
BUN: 9 mg/dL (ref 6–23)
CHLORIDE: 105 mmol/L (ref 96–112)
CO2: 24 mmol/L (ref 19–32)
Calcium: 8.4 mg/dL (ref 8.4–10.5)
Creatinine, Ser: 0.78 mg/dL (ref 0.50–1.35)
GFR calc Af Amer: >90 mL/min (ref >90)
GFR calc non Af Amer: >90 mL/min (ref >90)
Glucose, Bld: 166 mg/dL — ABNORMAL HIGH (ref 70–99)
POTASSIUM: 3.4 mmol/L — AB (ref 3.5–5.1)
Sodium: 137 mmol/L (ref 135–145)

## 2014-07-11 LAB — URINALYSIS, ROUTINE W REFLEX MICROSCOPIC
Glucose, UA: NEGATIVE mg/dL
KETONES UR: 15 mg/dL — AB
LEUKOCYTES UA: NEGATIVE
NITRITE: NEGATIVE
SPECIFIC GRAVITY, URINE: 1.02 (ref 1.005–1.030)
Urobilinogen, UA: 0.2 mg/dL (ref 0.0–1.0)
pH: 6 (ref 5.0–8.0)

## 2014-07-11 LAB — URINE MICROSCOPIC-ADD ON

## 2014-07-11 LAB — CBC
HCT: 38.5 % — ABNORMAL LOW (ref 39.0–52.0)
Hemoglobin: 13.4 g/dL (ref 13.0–17.0)
MCH: 30 pg (ref 26.0–34.0)
MCHC: 34.8 g/dL (ref 30.0–36.0)
MCV: 86.1 fL (ref 78.0–100.0)
Platelets: 158 10*3/uL (ref 150–400)
RBC: 4.47 MIL/uL (ref 4.22–5.81)
RDW: 13.1 % (ref 11.5–15.5)
WBC: 6.3 10*3/uL (ref 4.0–10.5)

## 2014-07-11 MED ORDER — OSELTAMIVIR PHOSPHATE 75 MG PO CAPS: 75.0000 mg | ORAL_CAPSULE | Freq: Two times a day (BID) | ORAL | Status: DC

## 2014-07-11 MED ORDER — AZITHROMYCIN 250 MG PO TABS: ORAL_TABLET | ORAL | Status: DC

## 2014-07-11 NOTE — ED Provider Notes (Signed)
CSN: [id number]     Arrival date & time 07/10/14  2209 History   First MD Initiated Contact with Patient 07/11/14 0109     Chief Complaint  Patient presents with  . Fever     (Consider location/radiation/quality/duration/timing/severity/associated sxs/prior Treatment) HPI  Patient reports he mowed the grass on March 10. Later that evening he started having dry coughing, sneezing, rhinorrhea that's clear, head stuffiness, feeling that his ears are full of water, and then tonight started having fever about 2 PM. He reports his chest is sore when he coughs otherwise he does not have pain. He states he has mild shortness of breath and felt like he was wheezing at home. He said he never had wheezing before and this never used an inhaler or a nebulizer. He reports he did get a flu shot this fall. He has not been around anybody else who is ill.  PCP Dr Gerarda Fraction Cardiology Dr Sung Amabile  reports cardiac cath in February and he did not require more stenting  Past Medical History  Diagnosis Date  . Allergic rhinitis   . GERD (gastroesophageal reflux disease)   . Essential hypertension   . Hyperlipidemia   . History of DVT (deep vein thrombosis) July 2014  . History of pulmonary embolus (PE) July 2014  . On continuous oral anticoagulation   . CAD (coronary artery disease)     DES Cypher x 2 RCA 2007; Repeat LHC 06/2014 angiographically minimal CAD with widely patent RCA stents EF 60-65%   . Myocardial infarction 2007  . Vertigo    Past Surgical History  Procedure Laterality Date  . Appendectomy    . Colonoscopy  11/2003    Single anal papilla and int hemorrhoids, pancolonic tics  . Coronary angioplasty with stent placement  06/15/2005    Cypher stent to mid RCA; cypher stent -ostial RCA, RESIDUAL disease LAD  . Cardiac catheterization  10/09/2005    Patent stents, medical therapy  . Cardiac catheterization  10/22/2006    Nonobstructive disease, 60% LAD, patent RCA stents  . Cholecystectomy N/A  07/14/2013    Procedure: LAPAROSCOPIC CHOLECYSTECTOMY;  Surgeon: Jamesetta So, MD;  Location: [facility];  Service: General;  Laterality: N/A;  . Left heart catheterization with coronary angiogram N/A 06/12/2014    Procedure: LEFT HEART CATHETERIZATION WITH CORONARY ANGIOGRAM;  Surgeon: Leonie Man, MD;  Location: [facility] CATH LAB;  Service: Cardiovascular;  Laterality: N/A;   Family History  Problem Relation Age of Onset  . Colon cancer Mother 2  . Colon cancer Paternal Uncle 38  . Colon cancer Brother 30    Rectal cancer  . Colon cancer Paternal Uncle 62  . Throat cancer Brother 3  . Prostate cancer Brother   . Pulmonary embolism Brother     Cancer   History  Substance Use Topics  . Smoking status: Former Smoker -- 0.10 packs/day for 13 years    Types: Cigarettes    Quit date: 05/27/1977  . Smokeless tobacco: Never Used  . Alcohol Use: No  lives at home retired  Review of Systems  All other systems reviewed and are negative.     Allergies  Codeine  Home Medications   Prior to Admission medications   Medication Sig Start Date End Date Taking? Authorizing Provider  amLODipine (NORVASC) 5 MG tablet TAKE ONE TABLET BY MOUTH ONCE DAILY 01/27/14   Lorretta Harp, MD  aspirin EC 81 MG tablet Take 81 mg by mouth daily.    Historical Provider,  MD  azithromycin (ZITHROMAX Z-PAK) 250 MG tablet Take 2 po the first day then once a day for the next 4 days. 07/11/14   Rolland Porter, MD  diazepam (VALIUM) 10 MG tablet Take 10 mg by mouth every 6 (six) hours as needed for anxiety.     Historical Provider, MD  fish oil-omega-3 fatty acids 1000 MG capsule Take 3 g by mouth daily.      Historical Provider, MD  losartan (COZAAR) 50 MG tablet Take 1 tablet (50 mg total) by mouth daily. 11/18/13   Lorretta Harp, MD  meclizine (ANTIVERT) 25 MG tablet Take 25 mg by mouth as needed for dizziness.  05/13/13   Historical Provider, MD  metoprolol tartrate (LOPRESSOR) 25 MG tablet Take 0.5 tablets (12.5  mg total) by mouth 2 (two) times daily. 07/07/14   Erlene Quan, PA-C  Multiple Vitamin (MULTIVITAMIN WITH MINERALS) TABS tablet Take 1 tablet by mouth daily.    Historical Provider, MD  omeprazole (PRILOSEC) 20 MG capsule Take 20 mg by mouth daily.      Historical Provider, MD  oseltamivir (TAMIFLU) 75 MG capsule Take 1 capsule (75 mg total) by mouth every 12 (twelve) hours. 07/11/14   Rolland Porter, MD  pravastatin (PRAVACHOL) 80 MG tablet Take 1 tablet (80 mg total) by mouth daily. 11/18/13   Lorretta Harp, MD  warfarin (COUMADIN) 4 MG tablet Take 1 tablet by mouth as directed. Per coumadin clinic - Monday through Friday 04/09/14   Historical Provider, MD  warfarin (COUMADIN) 5 MG tablet Take 5 mg by mouth as directed. Per coumadin clinic - Saturday and Sunday    Historical Provider, MD   ED Triage Vitals  Enc Vitals Group     BP 07/10/14 2310 154/62 mmHg     Pulse Rate 07/10/14 2310 108     Resp 07/10/14 2310 20     Temp 07/10/14 2310 103.2 F (39.6 C)     Temp Source 07/11/14 0137 Oral     SpO2 07/10/14 2310 98 %     Weight 07/10/14 2310 234 lb (106.142 kg)     Height 07/10/14 2310 5\' 9"  (1.753 m)     Head Cir --      Peak Flow --      Pain Score 07/10/14 2311 8     Pain Loc --      Pain Edu? --      Excl. in [place]? --    Vital signs normal except for fever and tachycardia   Physical Exam  Constitutional: He is oriented to person, place, and time. He appears well-developed and well-nourished.  Non-toxic appearance. He does not appear ill. No distress.  HENT:  Head: Normocephalic and atraumatic.  Right Ear: Hearing, tympanic membrane, external ear and ear canal normal.  Left Ear: Hearing, tympanic membrane, external ear and ear canal normal.  Nose: Nose normal. No mucosal edema or rhinorrhea.  Mouth/Throat: Oropharynx is clear and moist and mucous membranes are normal. No dental abscesses or uvula swelling.  Eyes: Conjunctivae and EOM are normal. Pupils are equal, round, and  reactive to light.  Neck: Normal range of motion and full passive range of motion without pain. Neck supple.  Cardiovascular: Normal rate, regular rhythm and normal heart sounds.  Exam reveals no gallop and no friction rub.   No murmur heard. Pulmonary/Chest: Effort normal and breath sounds normal. No respiratory distress. He has no wheezes. He has no rhonchi. He has no rales. He exhibits no  tenderness and no crepitus.  Patient has normal air movement and I do not hear any wheezing.  Abdominal: Soft. Normal appearance and bowel sounds are normal. He exhibits no distension. There is no tenderness. There is no rebound and no guarding.  Musculoskeletal: Normal range of motion. He exhibits no edema or tenderness.  Moves all extremities well.   Neurological: He is alert and oriented to person, place, and time. He has normal strength. No cranial nerve deficit.  Skin: Skin is warm, dry and intact. No rash noted. No erythema. No pallor.  Feels warm to touch  Psychiatric: He has a normal mood and affect. His speech is normal and behavior is normal. His mood appears not anxious.  Nursing note and vitals reviewed.   ED Course  Procedures (including critical care time)   Medications  acetaminophen (TYLENOL) tablet 1,000 mg (1,000 mg Oral Given 07/10/14 2325)     patient's fever improved to 99.3 after given Tylenol. Patient states he feels much improved.   Labs Review Results for orders placed or performed during the hospital encounter of 07/11/14  CBC  Result Value Ref Range   WBC 6.3 4.0 - 10.5 K/uL   RBC 4.47 4.22 - 5.81 MIL/uL   Hemoglobin 13.4 13.0 - 17.0 g/dL   HCT 38.5 (L) 39.0 - 52.0 %   MCV 86.1 78.0 - 100.0 fL   MCH 30.0 26.0 - 34.0 pg   MCHC 34.8 30.0 - 36.0 g/dL   RDW 13.1 11.5 - 15.5 %   Platelets 158 150 - 400 K/uL  Basic metabolic panel  Result Value Ref Range   Sodium 137 135 - 145 mmol/L   Potassium 3.4 (L) 3.5 - 5.1 mmol/L   Chloride 105 96 - 112 mmol/L   CO2 24 19 -  32 mmol/L   Glucose, Bld 166 (H) 70 - 99 mg/dL   BUN 9 6 - 23 mg/dL   Creatinine, Ser 0.78 0.50 - 1.35 mg/dL   Calcium 8.4 8.4 - 10.5 mg/dL   GFR calc non Af Amer >90 >90 mL/min   GFR calc Af Amer >90 >90 mL/min   Anion gap 8 5 - 15  Urinalysis, Routine w reflex microscopic  Result Value Ref Range   Color, Urine YELLOW YELLOW   APPearance CLEAR CLEAR   Specific Gravity, Urine 1.020 1.005 - 1.030   pH 6.0 5.0 - 8.0   Glucose, UA NEGATIVE NEGATIVE mg/dL   Hgb urine dipstick TRACE (A) NEGATIVE   Bilirubin Urine SMALL (A) NEGATIVE   Ketones, ur 15 (A) NEGATIVE mg/dL   Protein, ur TRACE (A) NEGATIVE mg/dL   Urobilinogen, UA 0.2 0.0 - 1.0 mg/dL   Nitrite NEGATIVE NEGATIVE   Leukocytes, UA NEGATIVE NEGATIVE  Urine microscopic-add on  Result Value Ref Range   Squamous Epithelial / LPF RARE RARE   WBC, UA 0-2 <3 WBC/hpf   RBC / HPF 0-2 <3 RBC/hpf   Bacteria, UA RARE RARE   Urine-Other MUCOUS PRESENT    Laboratory interpretation all normal except hyperglycemia     Imaging Review Dg Chest 2 View  07/11/2014   CLINICAL DATA:  Fever, Cough, body ache, pulmonary pulmonary congestion for 1 day. History of hypertension and pulmonary embolism.  EXAM: CHEST  2 VIEW  COMPARISON:  Chest radiograph June 10, 2014 in CT of the chest June 10, 2014  FINDINGS: Cardiac silhouette is upper limits of normal in size, mildly calcified aortic knob. No pleural effusion or focal consolidation. No pneumothorax. Soft tissue  planes and included osseous structures are nonsuspicious. Surgical clips in the included right abdomen likely reflect cholecystectomy. Mild degenerative change of thoracic spine.  IMPRESSION: Borderline cardiomegaly, no acute pulmonary process.   Electronically Signed   By: Elon Alas   On: 07/11/2014 00:29     EKG Interpretation   Date/Time:  Friday July 10 2014 23:17:14 EST Ventricular Rate:  105 PR Interval:  224 QRS Duration: 92 QT Interval:  334 QTC Calculation:  441 R Axis:   -17 Text Interpretation:  Sinus tachycardia with 1st degree A-V block Minimal  voltage criteria for LVH, may be normal variant Since last tracing rate  faster (12 Jun 2014) Confirmed by Arloa Prak  MD-I, Mansel Strother ([id number]) on 07/11/2014  12:03:12 AM      MDM   Final diagnoses:  Influenza-like illness   New Prescriptions   AZITHROMYCIN (ZITHROMAX Z-PAK) 250 MG TABLET    Take 2 po the first day then once a day for the next 4 days.   OSELTAMIVIR (TAMIFLU) 75 MG CAPSULE    Take 1 capsule (75 mg total) by mouth every 12 (twelve) hours.    Plan discharge  Rolland Porter, MD, Barbette Or, MD 07/11/14 [phone]

## 2014-07-11 NOTE — Discharge Instructions (Signed)
Drink plenty of fluids. You can take zyrtec or claritin OTC for your stuffy nose, sneezing. Take acetaminophen 1000 mg every 6hrs for fever. Take mucinex DM for your cough. Recheck if you get worse such as struggling to breathe, vomiting or diarrhea.    Influenza Influenza (flu) is an infection in the mouth, nose, and throat (respiratory tract) caused by a virus. The flu can make you feel very ill. Influenza spreads easily from person to person (contagious).  HOME CARE   Only take medicines as told by your doctor.  Use a cool mist humidifier to make breathing easier.  Get plenty of rest until your fever goes away. This usually takes 3 to 4 days.  Drink enough fluids to keep your pee (urine) clear or pale yellow.  Cover your mouth and nose when you cough or sneeze.  Wash your hands well to avoid spreading the flu.  Stay home from work or school until your fever has been gone for at least 1 full day.  Get a flu shot every year. GET HELP RIGHT AWAY IF:   You have trouble breathing or feel short of breath.  Your skin or nails turn blue.  You have severe neck pain or stiffness.  You have a severe headache, facial pain, or earache.  Your fever gets worse or keeps coming back.  You feel sick to your stomach (nauseous), throw up (vomit), or have watery poop (diarrhea).  You have chest pain.  You have a deep cough that gets worse, or you cough up more thick spit (mucus). MAKE SURE YOU:   Understand these instructions.  Will watch your condition.  Will get help right away if you are not doing well or get worse. Document Released: 01/25/2008 Document Revised: 09/01/2013 Document Reviewed: 07/17/2011 [facility] Patient Information 2015 [place], [place]. This information is not intended to replace advice given to you by your health care provider. Make sure you discuss any questions you have with your health care provider.

## 2014-07-14 DIAGNOSIS — J111 Influenza due to unidentified influenza virus with other respiratory manifestations: Secondary | ICD-10-CM | POA: Diagnosis not present

## 2014-07-14 DIAGNOSIS — J209 Acute bronchitis, unspecified: Secondary | ICD-10-CM | POA: Diagnosis not present

## 2014-07-14 DIAGNOSIS — J069 Acute upper respiratory infection, unspecified: Secondary | ICD-10-CM | POA: Diagnosis not present

## 2014-07-14 DIAGNOSIS — Z6831 Body mass index (BMI) 31.0-31.9, adult: Secondary | ICD-10-CM | POA: Diagnosis not present

## 2014-07-28 ENCOUNTER — Other Ambulatory Visit: Payer: Self-pay | Admitting: Cardiovascular Disease

## 2014-07-28 NOTE — Telephone Encounter (Signed)
Rx has been sent to the pharmacy electronically. ° °

## 2014-08-06 DIAGNOSIS — Z0000 Encounter for general adult medical examination without abnormal findings: Secondary | ICD-10-CM | POA: Diagnosis not present

## 2014-08-06 DIAGNOSIS — E6609 Other obesity due to excess calories: Secondary | ICD-10-CM | POA: Diagnosis not present

## 2014-08-06 DIAGNOSIS — Z6830 Body mass index (BMI) 30.0-30.9, adult: Secondary | ICD-10-CM | POA: Diagnosis not present

## 2014-08-06 DIAGNOSIS — E782 Mixed hyperlipidemia: Secondary | ICD-10-CM | POA: Diagnosis not present

## 2014-08-06 DIAGNOSIS — R7301 Impaired fasting glucose: Secondary | ICD-10-CM | POA: Diagnosis not present

## 2014-08-06 DIAGNOSIS — Z1389 Encounter for screening for other disorder: Secondary | ICD-10-CM | POA: Diagnosis not present

## 2014-09-10 ENCOUNTER — Encounter (HOSPITAL_BASED_OUTPATIENT_CLINIC_OR_DEPARTMENT_OTHER): Payer: PRIVATE HEALTH INSURANCE

## 2014-10-05 DIAGNOSIS — Z7901 Long term (current) use of anticoagulants: Secondary | ICD-10-CM | POA: Diagnosis not present

## 2014-10-07 ENCOUNTER — Ambulatory Visit (INDEPENDENT_AMBULATORY_CARE_PROVIDER_SITE_OTHER): Payer: Medicare Other | Admitting: Cardiovascular Disease

## 2014-10-07 ENCOUNTER — Encounter: Payer: Self-pay | Admitting: Cardiovascular Disease

## 2014-10-07 VITALS — BP 110/58 | HR 80 | Ht 69.0 in | Wt 229.3 lb

## 2014-10-07 DIAGNOSIS — I2510 Atherosclerotic heart disease of native coronary artery without angina pectoris: Secondary | ICD-10-CM | POA: Diagnosis not present

## 2014-10-07 DIAGNOSIS — I2699 Other pulmonary embolism without acute cor pulmonale: Secondary | ICD-10-CM | POA: Diagnosis not present

## 2014-10-07 DIAGNOSIS — E785 Hyperlipidemia, unspecified: Secondary | ICD-10-CM

## 2014-10-07 DIAGNOSIS — I10 Essential (primary) hypertension: Secondary | ICD-10-CM | POA: Diagnosis not present

## 2014-10-07 DIAGNOSIS — Z9861 Coronary angioplasty status: Secondary | ICD-10-CM

## 2014-10-07 NOTE — Assessment & Plan Note (Addendum)
History of coronary artery disease status post PCI and stenting of his RCA by myself February 2007 the setting of an inferior wall myocardial infarction. He was restudied in June 2008 and was found to have patent stents Proximal RCA with Noncritical Disease in His LAD and Normal LV Function. A Myoview Stress Test Performed 05/08/13 Showed Inferobasal Scar with Mild Inferolateral Hypokinesia Unchanged from Prior Studies Thought to Be Low Risk. He Is Asymptomatic.

## 2014-10-07 NOTE — Assessment & Plan Note (Addendum)
History of pulmonary embolism/DVT in the past on Coumadin anti-coagulation followed by his PCP

## 2014-10-07 NOTE — Patient Instructions (Signed)
Dr Berry recommends that you schedule a follow-up appointment in 1 year. You will receive a reminder letter in the mail two months in advance. If you don't receive a letter, please call our office to schedule the follow-up appointment. 

## 2014-10-07 NOTE — Assessment & Plan Note (Signed)
History of hypertension blood pressure measured 110/58. He is on amlodipine, losartan and metoprolol. Continue current meds at current dosing

## 2014-10-07 NOTE — Progress Notes (Signed)
10/07/2014 Steven Oneal   1945/11/02  [id number]  Primary Physician Glo Herring., MD Primary Cardiologist: Lorretta Harp MD [name]   HPI:   69 y/o male who has I last saw 6 months  ago who has a history of CAD status post PCI and stenting of his RCA by Dr [name] Feb 2007 in the setting of an MI. He was restudied in June 2008, and had patent stents in his proximal RCA and noncritical disease in his proximal LAD with normal LV function. He has not had a Myoview since and has done well from a cardiac standpoint. He did have a PE/DVT in July 2014 and is on chronic Coumadin. He recently saw [name] after he had palpitations and chest pain Thanksgiving night. The pt says his heart was racing, he was sweaty, and he had pain across his chest. He told me "thought I was having a heart attack". Steven Oneal increased his Coreg and the pt stopped his Niaspan suspecting this had something to do with his symptoms, he had been on this since 2008. Since he stopped the Niaspan he has done well. He denies any further chest pain or palpations. His Holter showed PACs, PVCs, and sinus arrythmia with transient bradycardia and sinus pauses up to 2.7 seconds. A Myoview stress test performed 05/08/13 showed inferobasal scar with mild inferolateral hypokinesis unchanged from prior functional studies. Since I saw him last he has remained asymptomatic.  Current Outpatient Prescriptions  Medication Sig Dispense Refill  . amLODipine (NORVASC) 5 MG tablet TAKE ONE TABLET BY MOUTH ONCE DAILY 90 tablet 2  . aspirin EC 81 MG tablet Take 81 mg by mouth daily.    Marland Kitchen azithromycin (ZITHROMAX Z-PAK) 250 MG tablet Take 2 po the first day then once a day for the next 4 days. 6 tablet 0  . carvedilol (COREG) 6.25 MG tablet Take 1 tablet by mouth daily.    . diazepam (VALIUM) 10 MG tablet Take 10 mg by mouth every 6 (six) hours as needed for anxiety.     . fish oil-omega-3 fatty acids 1000 MG capsule Take 3 g by  mouth daily.      Marland Kitchen losartan (COZAAR) 50 MG tablet Take 1 tablet (50 mg total) by mouth daily. 90 tablet 2  . meclizine (ANTIVERT) 25 MG tablet Take 25 mg by mouth as needed for dizziness.     . metoprolol tartrate (LOPRESSOR) 25 MG tablet Take 0.5 tablets (12.5 mg total) by mouth 2 (two) times daily. 30 tablet 11  . Multiple Vitamin (MULTIVITAMIN WITH MINERALS) TABS tablet Take 1 tablet by mouth daily.    Marland Kitchen omeprazole (PRILOSEC) 20 MG capsule Take 20 mg by mouth daily.      Marland Kitchen oseltamivir (TAMIFLU) 75 MG capsule Take 1 capsule (75 mg total) by mouth every 12 (twelve) hours. 10 capsule 0  . pravastatin (PRAVACHOL) 80 MG tablet Take 1 tablet (80 mg total) by mouth daily. 90 tablet 2  . warfarin (COUMADIN) 4 MG tablet Take 1 tablet by mouth as directed. Per coumadin clinic - Monday through Friday    . warfarin (COUMADIN) 5 MG tablet Take 5 mg by mouth as directed. Per coumadin clinic - Saturday and Sunday     No current facility-administered medications for this visit.    Allergies  Allergen Reactions  . Codeine Itching, Nausea Only and Rash    History   Social History  . Marital Status: Married    Spouse Name: N/A  .  Number of Children: 2  . Years of Education: N/A   Occupational History  . disablity     CAD   Social History Main Topics  . Smoking status: Former Smoker -- 0.10 packs/day for 13 years    Types: Cigarettes    Quit date: 05/27/1977  . Smokeless tobacco: Never Used  . Alcohol Use: No  . Drug Use: No  . Sexual Activity:    Partners: Female    Museum/gallery curator: None     Comment: spouse   Other Topics Concern  . Not on file   Social History Narrative     Review of Systems: General: negative for chills, fever, night sweats or weight changes.  Cardiovascular: negative for chest pain, dyspnea on exertion, edema, orthopnea, palpitations, paroxysmal nocturnal dyspnea or shortness of breath Dermatological: negative for rash Respiratory: negative for  cough or wheezing Urologic: negative for hematuria Abdominal: negative for nausea, vomiting, diarrhea, bright red blood per rectum, melena, or hematemesis Neurologic: negative for visual changes, syncope, or dizziness All other systems reviewed and are otherwise negative except as noted above.    Blood pressure 110/58, pulse 80, height 5\' 9"  (1.753 m), weight 229 lb 4.8 oz (104.01 kg).  General appearance: alert and no distress Neck: no adenopathy, no carotid bruit, no JVD, supple, symmetrical, trachea midline and thyroid not enlarged, symmetric, no tenderness/mass/nodules Lungs: clear to auscultation bilaterally Heart: regular rate and rhythm, S1, S2 normal, no murmur, click, rub or gallop Extremities: extremities normal, atraumatic, no cyanosis or edema  EKG not performed today  ASSESSMENT AND PLAN:   Pulmonary embolus History of pulmonary embolism/DVT in the past on Coumadin anti-coagulation followed by his PCP  Hyperlipidemia History of hyperlipidemia on pravastatin 80 mg daily with recent lipid profile performed 05/27/14 revealing LDL 67 and HDL of 37.  Essential hypertension History of hypertension blood pressure measured 110/58. He is on amlodipine, losartan and metoprolol. Continue current meds at current dosing  Coronary artery disease- RCA PCI in 2007; last cath 06/2014 w/ minimal CAD w/ widely patent RCA stents History of coronary artery disease status post PCI and stenting of his RCA by myself February 2007 the setting of an inferior wall myocardial infarction. He was restudied in June 2008 and was found to have patent stents Proximal RCA with Noncritical Disease in His LAD and Normal LV Function. A Myoview Stress Test Performed 05/08/13 Showed Inferobasal Scar with Mild Inferolateral Hypokinesia Unchanged from Prior Studies Thought to Be Low Risk. He Is Asymptomatic.      Lorretta Harp MD FACP,FACC,FAHA, [facility] 10/07/2014 1:56 PM

## 2014-10-07 NOTE — Assessment & Plan Note (Signed)
History of hyperlipidemia on pravastatin 80 mg daily with recent lipid profile performed 05/27/14 revealing LDL 67 and HDL of 37.

## 2014-10-14 ENCOUNTER — Ambulatory Visit (INDEPENDENT_AMBULATORY_CARE_PROVIDER_SITE_OTHER): Payer: PRIVATE HEALTH INSURANCE | Admitting: Gastroenterology

## 2014-10-14 ENCOUNTER — Encounter: Payer: Self-pay | Admitting: Gastroenterology

## 2014-10-14 VITALS — BP 118/71 | HR 88 | Temp 97.6°F | Ht 69.0 in | Wt 229.0 lb

## 2014-10-14 DIAGNOSIS — Z7901 Long term (current) use of anticoagulants: Secondary | ICD-10-CM | POA: Diagnosis not present

## 2014-10-14 DIAGNOSIS — K219 Gastro-esophageal reflux disease without esophagitis: Secondary | ICD-10-CM | POA: Diagnosis not present

## 2014-10-14 DIAGNOSIS — R6881 Early satiety: Secondary | ICD-10-CM | POA: Diagnosis not present

## 2014-10-14 DIAGNOSIS — Z8719 Personal history of other diseases of the digestive system: Secondary | ICD-10-CM | POA: Insufficient documentation

## 2014-10-14 DIAGNOSIS — K5792 Diverticulitis of intestine, part unspecified, without perforation or abscess without bleeding: Secondary | ICD-10-CM | POA: Diagnosis not present

## 2014-10-14 MED ORDER — METRONIDAZOLE 500 MG PO TABS: 500.0000 mg | ORAL_TABLET | Freq: Three times a day (TID) | ORAL | Status: DC

## 2014-10-14 MED ORDER — CIPROFLOXACIN HCL 500 MG PO TABS: 500.0000 mg | ORAL_TABLET | Freq: Two times a day (BID) | ORAL | Status: DC

## 2014-10-14 MED ORDER — PANTOPRAZOLE SODIUM 40 MG PO TBEC: 40.0000 mg | DELAYED_RELEASE_TABLET | Freq: Every day | ORAL | Status: DC

## 2014-10-14 NOTE — Patient Instructions (Signed)
  1. Please call your doctor today and let them know that you are starting Cipro, Flagyl, pantoprazole and stopping omeprazole. Cipro and Flagyl will interact with the coumadin and you need to have your coumadin dose lowered while on these medications.  2. Return to the office in 2-3 weeks. Call if you have worsening diarrhea, abdominal pain, fever (101) or go to ER.  3. Consume clear liquids for next 36-48 hours, then slowly add back bland soft diet.

## 2014-10-14 NOTE — Progress Notes (Signed)
cc'd to pcp 

## 2014-10-14 NOTE — Assessment & Plan Note (Signed)
69 year old gentleman with four-day history of acute onset left lower quadrant abdominal pain followed by diarrhea. No recent antibioitcs, travel, ill contacts, medication changes. Suspect acute diverticulitis although diarrhea usually not a primary symptom. Cannot exclude infectious gastroenteritis. Discussed options of empiric antibiotics therapy versus CT versus stool studies. Patient prefers trying antibiotics first which I feel is reasonable given known diverticulosis. Cipro and Flagyl for 14 days. Patient was advised to call his PCP to let him know that he is now on Cipro and Flagyl and we switched his omeprazole to pantoprazole. He was advised that he would need to have his Coumadin dose lowered while on anti-biotics but to address this with prescribing provider (PCP).  Return to the office in 3 weeks for follow-up. Warning signs described to patient for which she should go to emergency department or call us.

## 2014-10-14 NOTE — Progress Notes (Signed)
Primary Care Physician: Glo Herring., MD  Primary Gastroenterologist:  Garfield Cornea, MD   Chief Complaint  Patient presents with  . Abdominal Pain    HPI: Steven Oneal is a 69 y.o. male here for further evaluation of abdominal pain. Last seen in 2012. Family history significant for colon cancer, brother diagnosed at age 16, 2 paternal uncles and mother deceased due to colon cancer in her 85s. Last colonoscopy in 2012 along with an EGD. He had a small hiatal hernia. Single anal canal tag. Pancolonic diverticula, 2 colon polyps, one from the sigmoid region was tubular adenoma. Next colonoscopy planned in 5 years (April 2017).   We last saw him he had pulmonary embolus/DVT (2014), now on Coumadin. Had gallbladder removed in March 2015.  Sunday started having LLQ/suprapubic pain. Over course of 24 hours then developed nonbloody diarrhea. Yesterday 10 loose stools some nocturnal. No relief with BM. No melena, brbpr. No fever. No ill contact. No recent antibiotics. No medication changes. No suspicious foods.   Early satiety for 8 months or more. No vomiting. Some nausea. No heartburn on omeprazole. No dysphagia. No weight loss.   INR, done at [facility]. Goes Friday, INR was 3.5 one week ago.   Current Outpatient Prescriptions  Medication Sig Dispense Refill  . amLODipine (NORVASC) 5 MG tablet TAKE ONE TABLET BY MOUTH ONCE DAILY 90 tablet 2  . aspirin EC 81 MG tablet Take 81 mg by mouth daily.    . carvedilol (COREG) 6.25 MG tablet Take 1 tablet by mouth daily.    . diazepam (VALIUM) 10 MG tablet Take 10 mg by mouth every 6 (six) hours as needed for anxiety.     . fish oil-omega-3 fatty acids 1000 MG capsule Take 3 g by mouth daily.      Marland Kitchen losartan (COZAAR) 50 MG tablet Take 1 tablet (50 mg total) by mouth daily. 90 tablet 2  . meclizine (ANTIVERT) 25 MG tablet Take 25 mg by mouth as needed for dizziness.     . metoprolol tartrate (LOPRESSOR) 25 MG tablet Take 0.5 tablets  (12.5 mg total) by mouth 2 (two) times daily. 30 tablet 11  . Multiple Vitamin (MULTIVITAMIN WITH MINERALS) TABS tablet Take 1 tablet by mouth daily.    Marland Kitchen omeprazole (PRILOSEC) 20 MG capsule Take 20 mg by mouth daily.      . pravastatin (PRAVACHOL) 80 MG tablet Take 1 tablet (80 mg total) by mouth daily. 90 tablet 2  . warfarin (COUMADIN) 4 MG tablet Take 1 tablet by mouth as directed. Per coumadin clinic - Monday through Friday    . warfarin (COUMADIN) 5 MG tablet Take 5 mg by mouth as directed. Per coumadin clinic - Saturday and Sunday     No current facility-administered medications for this visit.    Allergies as of 10/14/2014 - Review Complete 10/14/2014  Allergen Reaction Noted  . Codeine Itching, Nausea Only, and Rash 08/01/2010   Past Medical History  Diagnosis Date  . Allergic rhinitis   . GERD (gastroesophageal reflux disease)   . Essential hypertension   . Hyperlipidemia   . History of DVT (deep vein thrombosis) July 2014  . History of pulmonary embolus (PE) July 2014  . On continuous oral anticoagulation   . CAD (coronary artery disease)     DES Cypher x 2 RCA 2007; Repeat LHC 06/2014 angiographically minimal CAD with widely patent RCA stents EF 60-65%   . Myocardial infarction 2007  . Vertigo  Past Surgical History  Procedure Laterality Date  . Appendectomy    . Colonoscopy  11/2003    Single anal papilla and int hemorrhoids, pancolonic tics  . Coronary angioplasty with stent placement  06/15/2005    Cypher stent to mid RCA; cypher stent -ostial RCA, RESIDUAL disease LAD  . Cardiac catheterization  10/09/2005    Patent stents, medical therapy  . Cardiac catheterization  10/22/2006    Nonobstructive disease, 60% LAD, patent RCA stents  . Cholecystectomy N/A 07/14/2013    Procedure: LAPAROSCOPIC CHOLECYSTECTOMY;  Surgeon: Jamesetta So, MD;  Location: [facility];  Service: General;  Laterality: N/A;  . Left heart catheterization with coronary angiogram N/A 06/12/2014     Procedure: LEFT HEART CATHETERIZATION WITH CORONARY ANGIOGRAM;  Surgeon: Leonie Man, MD;  Location: [facility] CATH LAB;  Service: Cardiovascular;  Laterality: N/A;  . Colonoscopy  2012    RMR: Single anal canal tag, otherwise normal rectum. Pan colonic diverticula, diminutive polyp, transverse colon status post cold biopsy removeal, polyp in the sigmoid status post hot snare polypectomy   . Esophagogastroduodenoscopy  2012    RMR: EGD normal esophagus, small hiatal hernia, otherwise normal stomach D1 and D2.    Family History  Problem Relation Age of Onset  . Colon cancer Mother 4  . Colon cancer Paternal Uncle 32  . Colon cancer Brother 4    Rectal cancer  . Colon cancer Paternal Uncle 46  . Throat cancer Brother 65  . Prostate cancer Brother   . Pulmonary embolism Brother     Cancer   History   Social History  . Marital Status: Married    Spouse Name: N/A  . Number of Children: 2  . Years of Education: N/A   Occupational History  . disablity     [employer]   Social History Main Topics  . Smoking status: Former Smoker -- 0.10 packs/day for 13 years    Types: Cigarettes    Quit date: 05/27/1977  . Smokeless tobacco: Never Used  . Alcohol Use: No  . Drug Use: No  . Sexual Activity:    Partners: Female    Museum/gallery curator: None     Comment: spouse   Other Topics Concern  . None   Social History Narrative    ROS:  General: Negative for anorexia, weight loss, fever, chills, fatigue, weakness. ENT: Negative for hoarseness, difficulty swallowing , nasal congestion. CV: Negative for chest pain, angina, palpitations, dyspnea on exertion, peripheral edema.  Respiratory: Negative for dyspnea at rest, dyspnea on exertion, cough, sputum, wheezing.  GI: See history of present illness. GU:  Negative for dysuria, hematuria, urinary incontinence, urinary frequency, nocturnal urination.  Endo: Negative for unusual weight change.    Physical Examination:   BP 118/71 mmHg   Pulse 88  Temp(Src) 97.6 F (36.4 C) (Oral)  Ht 5\' 9"  (1.753 m)  Wt 229 lb (103.874 kg)  BMI 33.80 kg/m2  General: Well-nourished, well-developed in no acute distress.  Eyes: No icterus. Mouth: Oropharyngeal mucosa moist and pink , no lesions erythema or exudate. Lungs: Clear to auscultation bilaterally.  Heart: Regular rate and rhythm, no murmurs rubs or gallops.  Abdomen: Bowel sounds are normal, moderate LLQ tenderness, nondistended, no hepatosplenomegaly or masses, no abdominal bruits or hernia , no rebound or guarding.   Extremities: No lower extremity edema. No clubbing or deformities. Neuro: Alert and oriented x 4   Skin: Warm and dry, no jaundice.   Psych: Alert and cooperative, normal mood  and affect.  Labs:  Lab Results  Component Value Date   WBC 6.3 07/11/2014   HGB 13.4 07/11/2014   HCT 38.5* 07/11/2014   MCV 86.1 07/11/2014   PLT 158 07/11/2014   Lab Results  Component Value Date   CREATININE 0.78 07/11/2014   BUN 9 07/11/2014   NA 137 07/11/2014   K 3.4* 07/11/2014   CL 105 07/11/2014   CO2 24 07/11/2014   Lab Results  Component Value Date   ALT 30 06/11/2014   AST 22 06/11/2014   ALKPHOS 36* 06/11/2014   BILITOT 1.1 06/11/2014   Labs from 08/06/2014 White blood cell count 7800, hemoglobin 15, hematocrit 45.1, MCV 88, platelets 2 17,000, BUN 9, creatinine 0.78, total bilirubin 0.8, alkaline phosphatase 51, AST 16, ALT 24, INR 3.5.   Imaging Studies: No results found.

## 2014-10-30 DIAGNOSIS — Z7901 Long term (current) use of anticoagulants: Secondary | ICD-10-CM | POA: Diagnosis not present

## 2014-11-10 ENCOUNTER — Encounter: Payer: Self-pay | Admitting: Gastroenterology

## 2014-11-10 ENCOUNTER — Ambulatory Visit (INDEPENDENT_AMBULATORY_CARE_PROVIDER_SITE_OTHER): Payer: PRIVATE HEALTH INSURANCE | Admitting: Gastroenterology

## 2014-11-10 VITALS — BP 114/64 | HR 80 | Temp 97.9°F | Ht 69.0 in | Wt 228.0 lb

## 2014-11-10 DIAGNOSIS — K5792 Diverticulitis of intestine, part unspecified, without perforation or abscess without bleeding: Secondary | ICD-10-CM

## 2014-11-10 DIAGNOSIS — R6881 Early satiety: Secondary | ICD-10-CM | POA: Diagnosis not present

## 2014-11-10 NOTE — Progress Notes (Signed)
Primary Care Physician: Glo Herring., MD  Primary Gastroenterologist:  Garfield Cornea, MD   Chief Complaint  Patient presents with  . Follow-up    HPI: Steven Oneal is a 69 y.o. male here for follow-up. Seen 3-4 weeks ago for acute onset left lower quadrant/suprapubic abdominal pain associated with nonbloody diarrhea. Also complained of early satiety for 8 months. Treated empirically for diverticulitis with Cipro and Flagyl. We switched his omeprazole to pantoprazole.  Three days after on pantoprazole, heartburn and early satiety resolved. Likes pantoprazole, 90 day supply only $6. About five days into the cipro/flagyl, his abdominal pain improved. he is now pain free. BM returned to normal. No rectal bleeding. Feels good. Next surveillance colonoscopy due 07/2015.      Current Outpatient Prescriptions  Medication Sig Dispense Refill  . amLODipine (NORVASC) 5 MG tablet TAKE ONE TABLET BY MOUTH ONCE DAILY 90 tablet 2  . aspirin EC 81 MG tablet Take 81 mg by mouth daily.    . carvedilol (COREG) 6.25 MG tablet Take 1 tablet by mouth daily.    . diazepam (VALIUM) 10 MG tablet Take 10 mg by mouth every 6 (six) hours as needed for anxiety.     . fish oil-omega-3 fatty acids 1000 MG capsule Take 3 g by mouth daily.      Marland Kitchen losartan (COZAAR) 50 MG tablet Take 1 tablet (50 mg total) by mouth daily. 90 tablet 2  . meclizine (ANTIVERT) 25 MG tablet Take 25 mg by mouth as needed for dizziness.     . metoprolol tartrate (LOPRESSOR) 25 MG tablet Take 0.5 tablets (12.5 mg total) by mouth 2 (two) times daily. 30 tablet 11  . Multiple Vitamin (MULTIVITAMIN WITH MINERALS) TABS tablet Take 1 tablet by mouth daily.    . pantoprazole (PROTONIX) 40 MG tablet Take 1 tablet (40 mg total) by mouth daily. 30 tablet 5  . pravastatin (PRAVACHOL) 80 MG tablet Take 1 tablet (80 mg total) by mouth daily. 90 tablet 2  . warfarin (COUMADIN) 4 MG tablet Take 1 tablet by mouth as directed. Per coumadin  clinic - Monday through Friday    . warfarin (COUMADIN) 5 MG tablet Take 5 mg by mouth as directed. Per coumadin clinic - Saturday and Sunday     No current facility-administered medications for this visit.    Allergies as of 11/10/2014 - Review Complete 11/10/2014  Allergen Reaction Noted  . Codeine Itching, Nausea Only, and Rash 08/01/2010    ROS:  General: Negative for anorexia, weight loss, fever, chills, fatigue, weakness. ENT: Negative for hoarseness, difficulty swallowing , nasal congestion. CV: Negative for chest pain, angina, palpitations, dyspnea on exertion, peripheral edema.  Respiratory: Negative for dyspnea at rest, dyspnea on exertion, cough, sputum, wheezing.  GI: See history of present illness. GU:  Negative for dysuria, hematuria, urinary incontinence, urinary frequency, nocturnal urination.  Endo: Negative for unusual weight change.    Physical Examination:   BP 114/64 mmHg  Pulse 80  Temp(Src) 97.9 F (36.6 C) (Oral)  Ht 5\' 9"  (1.753 m)  Wt 228 lb (103.42 kg)  BMI 33.65 kg/m2  General: Well-nourished, well-developed in no acute distress.  Eyes: No icterus. Mouth: Oropharyngeal mucosa moist and pink , no lesions erythema or exudate. Lungs: Clear to auscultation bilaterally.  Heart: Regular rate and rhythm, no murmurs rubs or gallops.  Abdomen: Bowel sounds are normal, nontender, nondistended, no hepatosplenomegaly or masses, no abdominal bruits or hernia , no rebound or guarding.  Extremities: No lower extremity edema. No clubbing or deformities. Neuro: Alert and oriented x 4   Skin: Warm and dry, no jaundice.   Psych: Alert and cooperative, normal mood and affect.

## 2014-11-10 NOTE — Progress Notes (Signed)
cc'ed to pcp °

## 2014-11-10 NOTE — Assessment & Plan Note (Signed)
Likely recent acute diverticulitis versus infectious colitis. Symptoms resolved after Cipro and Flagyl. Back to baseline now. He will call us if he has any recurrent GI symptoms. Otherwise plan for surveillance colonoscopy in April 2017.

## 2014-11-10 NOTE — Assessment & Plan Note (Signed)
Early satiety and heartburn improved with switching PPI therapy to pantoprazole. Continue current regimen. Antireflux measures discussed.

## 2014-11-10 NOTE — Patient Instructions (Signed)
1. Call if you have any recurrent GI symptoms, otherwise we will plan on colonoscopy in 07/2015.

## 2014-11-14 ENCOUNTER — Other Ambulatory Visit: Payer: Self-pay | Admitting: Cardiovascular Disease

## 2014-11-17 DIAGNOSIS — Z7901 Long term (current) use of anticoagulants: Secondary | ICD-10-CM | POA: Diagnosis not present

## 2014-12-07 DIAGNOSIS — Z7901 Long term (current) use of anticoagulants: Secondary | ICD-10-CM | POA: Diagnosis not present

## 2014-12-18 DIAGNOSIS — Z7901 Long term (current) use of anticoagulants: Secondary | ICD-10-CM | POA: Diagnosis not present

## 2014-12-22 ENCOUNTER — Other Ambulatory Visit: Payer: Self-pay | Admitting: Cardiovascular Disease

## 2014-12-23 NOTE — Telephone Encounter (Signed)
Rx(s) sent to pharmacy electronically.  

## 2015-01-13 DIAGNOSIS — Z7901 Long term (current) use of anticoagulants: Secondary | ICD-10-CM | POA: Diagnosis not present

## 2015-01-20 ENCOUNTER — Other Ambulatory Visit: Payer: Self-pay | Admitting: Cardiovascular Disease

## 2015-01-20 NOTE — Telephone Encounter (Signed)
Rx has been sent to the pharmacy electronically. ° °

## 2015-02-19 DIAGNOSIS — Z7901 Long term (current) use of anticoagulants: Secondary | ICD-10-CM | POA: Diagnosis not present

## 2015-02-19 DIAGNOSIS — Z23 Encounter for immunization: Secondary | ICD-10-CM | POA: Diagnosis not present

## 2015-04-12 ENCOUNTER — Other Ambulatory Visit: Payer: Self-pay | Admitting: Cardiovascular Disease

## 2015-04-19 DIAGNOSIS — Z7901 Long term (current) use of anticoagulants: Secondary | ICD-10-CM | POA: Diagnosis not present

## 2015-05-23 ENCOUNTER — Other Ambulatory Visit: Payer: Self-pay | Admitting: Cardiovascular Disease

## 2015-05-24 DIAGNOSIS — L570 Actinic keratosis: Secondary | ICD-10-CM | POA: Diagnosis not present

## 2015-05-24 DIAGNOSIS — L820 Inflamed seborrheic keratosis: Secondary | ICD-10-CM | POA: Diagnosis not present

## 2015-05-24 DIAGNOSIS — X32XXXA Exposure to sunlight, initial encounter: Secondary | ICD-10-CM | POA: Diagnosis not present

## 2015-05-24 DIAGNOSIS — D485 Neoplasm of uncertain behavior of skin: Secondary | ICD-10-CM | POA: Diagnosis not present

## 2015-05-24 DIAGNOSIS — D235 Other benign neoplasm of skin of trunk: Secondary | ICD-10-CM | POA: Diagnosis not present

## 2015-05-24 DIAGNOSIS — D225 Melanocytic nevi of trunk: Secondary | ICD-10-CM | POA: Diagnosis not present

## 2015-05-24 NOTE — Telephone Encounter (Signed)
Rx request sent to pharmacy.  

## 2015-05-25 ENCOUNTER — Telehealth: Payer: Self-pay | Admitting: Cardiovascular Disease

## 2015-05-25 MED ORDER — CARVEDILOL 6.25 MG PO TABS: 6.2500 mg | ORAL_TABLET | Freq: Two times a day (BID) | ORAL | Status: DC

## 2015-05-25 NOTE — Telephone Encounter (Signed)
Refill sent to the pharmacy electronically.  

## 2015-05-25 NOTE — Telephone Encounter (Signed)
°*  STAT* If patient is at the pharmacy, call can be transferred to refill team.   1. Which medications need to be refilled? (please list name of each medication and dose if known) Carvediol  2. Which pharmacy/location (including street and city if local pharmacy) is medication to be sent to?[facility]   3. Do they need a 30 day or 90 day supply? [place]

## 2015-05-26 ENCOUNTER — Other Ambulatory Visit: Payer: Self-pay | Admitting: Cardiovascular Disease

## 2015-05-26 ENCOUNTER — Telehealth: Payer: Self-pay | Admitting: Cardiology

## 2015-05-26 MED ORDER — METOPROLOL TARTRATE 25 MG PO TABS: 12.5000 mg | ORAL_TABLET | Freq: Two times a day (BID) | ORAL | Status: DC

## 2015-05-26 NOTE — Telephone Encounter (Signed)
Hilda Blades handled this via earlier phone call.

## 2015-05-31 DIAGNOSIS — D485 Neoplasm of uncertain behavior of skin: Secondary | ICD-10-CM | POA: Diagnosis not present

## 2015-05-31 DIAGNOSIS — L98429 Non-pressure chronic ulcer of back with unspecified severity: Secondary | ICD-10-CM | POA: Diagnosis not present

## 2015-06-18 ENCOUNTER — Encounter: Payer: Self-pay | Admitting: Internal Medicine

## 2015-06-18 DIAGNOSIS — Z7901 Long term (current) use of anticoagulants: Secondary | ICD-10-CM | POA: Diagnosis not present

## 2015-07-08 ENCOUNTER — Telehealth: Payer: Self-pay | Admitting: Gastroenterology

## 2015-07-08 ENCOUNTER — Ambulatory Visit: Payer: Medicare Other | Admitting: Gastroenterology

## 2015-07-08 DIAGNOSIS — Z23 Encounter for immunization: Secondary | ICD-10-CM | POA: Diagnosis not present

## 2015-07-08 DIAGNOSIS — Z1389 Encounter for screening for other disorder: Secondary | ICD-10-CM | POA: Diagnosis not present

## 2015-07-08 DIAGNOSIS — Z0000 Encounter for general adult medical examination without abnormal findings: Secondary | ICD-10-CM | POA: Diagnosis not present

## 2015-07-08 NOTE — Telephone Encounter (Signed)
Pt. Was a no show

## 2015-07-08 NOTE — Telephone Encounter (Signed)
Please send patient a letter. Thanks!

## 2015-07-12 ENCOUNTER — Other Ambulatory Visit: Payer: Self-pay

## 2015-07-12 ENCOUNTER — Ambulatory Visit (INDEPENDENT_AMBULATORY_CARE_PROVIDER_SITE_OTHER): Payer: Medicare Other | Admitting: Gastroenterology

## 2015-07-12 ENCOUNTER — Encounter: Payer: Self-pay | Admitting: Gastroenterology

## 2015-07-12 VITALS — BP 145/77 | HR 80 | Temp 98.0°F | Ht 69.0 in | Wt 236.2 lb

## 2015-07-12 DIAGNOSIS — R1013 Epigastric pain: Secondary | ICD-10-CM | POA: Diagnosis not present

## 2015-07-12 DIAGNOSIS — Z86010 Personal history of colonic polyps: Secondary | ICD-10-CM

## 2015-07-12 DIAGNOSIS — Z800 Family history of malignant neoplasm of digestive organs: Secondary | ICD-10-CM | POA: Diagnosis not present

## 2015-07-12 DIAGNOSIS — R6881 Early satiety: Secondary | ICD-10-CM

## 2015-07-12 DIAGNOSIS — Z7901 Long term (current) use of anticoagulants: Secondary | ICD-10-CM

## 2015-07-12 MED ORDER — ENOXAPARIN SODIUM 100 MG/ML ~~LOC~~ SOLN: 100.0000 mg | Freq: Two times a day (BID) | SUBCUTANEOUS | Status: DC

## 2015-07-12 NOTE — Assessment & Plan Note (Signed)
70 year old gentleman with history of adenomatous colon polyps, multiple family members with history of colon cancer as outlined above, who presents for surveillance colonoscopy. He complains of postprandial early satiety, vague upper abdominal pain and bloating that has been going on for months. He is on PPI therapy. Takes aspirin daily but no other NSAIDs. Cannot exclude gastritis, peptic ulcer disease, gastroparesis. Offered patient colonoscopy plus or minus EGD in the near future. He is on chronic Coumadin for history of DVT/PE. Will plan on Lovenox bridge.  I have discussed the risks, alternatives, benefits with regards to but not limited to the risk of reaction to medication, bleeding, infection, perforation and the patient is agreeable to proceed. Written consent to be obtained.

## 2015-07-12 NOTE — Progress Notes (Signed)
cc'ed to pcp °

## 2015-07-12 NOTE — Progress Notes (Signed)
Primary Care Physician:  Glo Herring., MD  Primary Gastroenterologist:  Garfield Cornea, MD   Chief Complaint  Patient presents with  . set up TCS    HPI:  Steven Oneal is a 70 y.o. male here to schedule surveillance colonoscopy. Patient has a personal history of adenomatous colon polyps as well as multiple family members with colon cancer at early age. Patient's last colonoscopy was in April 2012. Also had an EGD at that time. EGD was normal except for small hiatal hernia. He had pancolonic diverticula, tubular adenoma removed from the sigmoid colon.  Clinically he continues to have early satiety/bloating postprandially. This is been gone on for several months. When I saw him last year we treated him empirically for diverticulitis and GERD symptoms seem to resolve at the time. He is not a diabetic. He takes aspirin daily but no other NSAIDs. Bowel movements are regular. No melena rectal bleeding. He does have some postprandial upper abdominal pain. No dysphagia. No heartburn.  Patient had left lower extremity DVT and pulmonary embolus back in 2014. He is on chronic Coumadin therapy.  Current Outpatient Prescriptions  Medication Sig Dispense Refill  . amLODipine (NORVASC) 5 MG tablet TAKE ONE TABLET BY MOUTH ONCE DAILY 90 tablet 1  . aspirin EC 81 MG tablet Take 81 mg by mouth daily.    . diazepam (VALIUM) 10 MG tablet Take 10 mg by mouth every 6 (six) hours as needed for anxiety.     . fish oil-omega-3 fatty acids 1000 MG capsule Take 3 g by mouth daily.      Marland Kitchen losartan (COZAAR) 50 MG tablet TAKE ONE TABLET BY MOUTH ONCE DAILY 90 tablet 1  . meclizine (ANTIVERT) 25 MG tablet Take 25 mg by mouth as needed for dizziness.     . metoprolol tartrate (LOPRESSOR) 25 MG tablet Take 0.5 tablets (12.5 mg total) by mouth 2 (two) times daily. 30 tablet 6  . Multiple Vitamin (MULTIVITAMIN WITH MINERALS) TABS tablet Take 1 tablet by mouth daily.    . pantoprazole (PROTONIX) 40 MG tablet Take 1  tablet (40 mg total) by mouth daily. 30 tablet 5  . pravastatin (PRAVACHOL) 80 MG tablet TAKE ONE TABLET BY MOUTH ONCE DAILY 90 tablet 2  . warfarin (COUMADIN) 4 MG tablet Take 1 tablet by mouth as directed. Daily    . carvedilol (COREG) 6.25 MG tablet Take 1 tablet (6.25 mg total) by mouth 2 (two) times daily with a meal. 180 tablet 1   No current facility-administered medications for this visit.    Allergies as of 07/12/2015 - Review Complete 07/12/2015  Allergen Reaction Noted  . Codeine Itching, Nausea Only, and Rash 08/01/2010    Past Medical History  Diagnosis Date  . Allergic rhinitis   . GERD (gastroesophageal reflux disease)   . Essential hypertension   . Hyperlipidemia   . History of DVT (deep vein thrombosis) July 2014  . History of pulmonary embolus (PE) July 2014  . On continuous oral anticoagulation   . CAD (coronary artery disease)     DES Cypher x 2 RCA 2007; Repeat LHC 06/2014 angiographically minimal CAD with widely patent RCA stents EF 60-65%   . Myocardial infarction ([place]) 2007  . Vertigo     Past Surgical History  Procedure Laterality Date  . Appendectomy    . Colonoscopy  11/2003    Single anal papilla and int hemorrhoids, pancolonic tics  . Coronary angioplasty with stent placement  06/15/2005    Cypher  stent to mid RCA; cypher stent -ostial RCA, RESIDUAL disease LAD  . Cardiac catheterization  10/09/2005    Patent stents, medical therapy  . Cardiac catheterization  10/22/2006    Nonobstructive disease, 60% LAD, patent RCA stents  . Cholecystectomy N/A 07/14/2013    Procedure: LAPAROSCOPIC CHOLECYSTECTOMY;  Surgeon: Jamesetta So, MD;  Location: [facility] OR[facility];  Service: General;  Laterality: N/A;  . Left heart catheterization with coronary angiogram N/A 06/12/2014    Procedure: LEFT HEART CATHETERIZATION WITH CORONARY ANGIOGRAM;  Surgeon: Leonie Man, MD;  Location: [facility] CATH LAB;  Service: Cardiovascular;  Laterality: N/A;  . Colonoscopy  2012    RMR: Single  anal canal tag, otherwise normal rectum. Pan colonic diverticula, diminutive polyp, transverse colon status post cold biopsy removeal, polyp in the sigmoid status post hot snare polypectomy   . Esophagogastroduodenoscopy  2012    RMR: EGD normal esophagus, small hiatal hernia, otherwise normal stomach D1 and D2.     Family History  Problem Relation Age of Onset  . Colon cancer Mother 62  . Colon cancer Paternal Uncle 44  . Colon cancer Brother 2    Rectal cancer  . Colon cancer Paternal Uncle 41  . Throat cancer Brother 53  . Prostate cancer Brother   . Pulmonary embolism Brother     Cancer    Social History   Social History  . Marital Status: Married    Spouse Name: N/A  . Number of Children: 2  . Years of Education: N/A   Occupational History  . disablity     [employer]   Social History Main Topics  . Smoking status: Former Smoker -- 0.10 packs/day for 13 years    Types: Cigarettes    Quit date: 05/27/1977  . Smokeless tobacco: Never Used  . Alcohol Use: No  . Drug Use: No  . Sexual Activity:    Partners: Female    Museum/gallery curator: None     Comment: spouse   Other Topics Concern  . Not on file   Social History Narrative      ROS:  General: Negative for anorexia, weight loss, fever, chills, fatigue, weakness. Eyes: Negative for vision changes.  ENT: Negative for hoarseness, difficulty swallowing , nasal congestion. CV: Negative for chest pain, angina, palpitations, dyspnea on exertion, peripheral edema.  Respiratory: Negative for dyspnea at rest, dyspnea on exertion, cough, sputum, wheezing.  GI: See history of present illness. GU:  Negative for dysuria, hematuria, urinary incontinence, urinary frequency, nocturnal urination.  MS: Negative for joint pain, low back pain.  Derm: Negative for rash or itching.  Neuro: Negative for weakness, abnormal sensation, seizure, frequent headaches, memory loss, confusion.  Psych: Negative for anxiety, depression,  suicidal ideation, hallucinations.  Endo: Negative for unusual weight change.  Heme: Negative for bruising or bleeding. Allergy: Negative for rash or hives.    Physical Examination:  BP 145/77 mmHg  Pulse 80  Temp(Src) 98 F (36.7 C)  Ht 5\' 9"  (1.753 m)  Wt 236 lb 3.2 oz (107.14 kg)  BMI 34.86 kg/m2   General: Well-nourished, well-developed in no acute distress.  Head: Normocephalic, atraumatic.   Eyes: Conjunctiva pink, no icterus. Mouth: Oropharyngeal mucosa moist and pink , no lesions erythema or exudate. Neck: Supple without thyromegaly, masses, or lymphadenopathy.  Lungs: Clear to auscultation bilaterally.  Heart: Regular rate and rhythm, no murmurs rubs or gallops.  Abdomen: Bowel sounds are normal, nontender, nondistended, no hepatosplenomegaly or masses, no abdominal bruits or  hernia , no rebound or guarding.   Rectal: Deferred Extremities: No lower extremity edema. No clubbing or deformities.  Neuro: Alert and oriented x 4 , grossly normal neurologically.  Skin: Warm and dry, no rash or jaundice.   Psych: Alert and cooperative, normal mood and affect.

## 2015-07-12 NOTE — Progress Notes (Signed)
Please follow the Medication Instructions below for your procedure:  You need to hold your coumadin four days before your procedure and start a Lovenox bridge.  Your procedure is scheduled for 08/03/15. RX for Lovenox has been sent to your pharmacy. Please pick up asap in case they have to order it, etc.     07/29/15 Last dose of warfarin   07/30/15 No Coumadin/warfarin   07/31/15 No Coumadin/warfarin  Begin Lovenox/enoxaparin [place] 100mg  at 10am and 100mg  at 10pm   08/01/15 No Coumadin/warfarin Lovenox/enoxaparin [place] 100mg  at10am and 100mg  at10pm  08/02/15 No Coumadin/warfarin Lovenox/enoxaparin [place] 100mg  at 10amand 100mg  at 10pm  08/03/15 Day of procedue No Coumadin/warfarin No Lovenox/enoxaparin  Dr. Gala Romney will provide you with further instructions about your coumadin and Lovenox after your procedure.     You will be instructed when to get your next INR checked at your PCP.

## 2015-07-12 NOTE — Patient Instructions (Signed)
1. Colonoscopy with possible upper endoscopy with Dr. Gala Romney. You will need to take Lovenox shots to reduce risk of blood clots when we take you off your coumadin. See separate instructions.

## 2015-07-12 NOTE — Progress Notes (Signed)
NO PA is needed for the TCS+/-EGD

## 2015-07-19 ENCOUNTER — Other Ambulatory Visit: Payer: Self-pay | Admitting: Cardiovascular Disease

## 2015-07-19 NOTE — Telephone Encounter (Signed)
Rx refill sent to pharmacy. 

## 2015-07-26 ENCOUNTER — Other Ambulatory Visit: Payer: Self-pay

## 2015-07-26 MED ORDER — PEG 3350-KCL-NA BICARB-NACL 420 G PO SOLR: 4000.0000 mL | ORAL | Status: DC

## 2015-08-03 ENCOUNTER — Encounter (HOSPITAL_COMMUNITY)
Admission: RE | Disposition: A | Discharge status: Home / Self Care | Payer: Self-pay | Source: Ambulatory Visit | Attending: Internal Medicine

## 2015-08-03 ENCOUNTER — Ambulatory Visit (HOSPITAL_COMMUNITY)
Admission: RE | Admit: 2015-08-03 | Discharge: 2015-08-03 | Disposition: A | Discharge status: Home / Self Care | Payer: Medicare Other | Source: Ambulatory Visit | Attending: Internal Medicine | Admitting: Internal Medicine

## 2015-08-03 ENCOUNTER — Encounter (HOSPITAL_COMMUNITY): Payer: Self-pay | Admitting: *Deleted

## 2015-08-03 DIAGNOSIS — Z79899 Other long term (current) drug therapy: Secondary | ICD-10-CM | POA: Diagnosis not present

## 2015-08-03 DIAGNOSIS — R6881 Early satiety: Secondary | ICD-10-CM | POA: Insufficient documentation

## 2015-08-03 DIAGNOSIS — K449 Diaphragmatic hernia without obstruction or gangrene: Secondary | ICD-10-CM | POA: Diagnosis not present

## 2015-08-03 DIAGNOSIS — E785 Hyperlipidemia, unspecified: Secondary | ICD-10-CM | POA: Insufficient documentation

## 2015-08-03 DIAGNOSIS — Z955 Presence of coronary angioplasty implant and graft: Secondary | ICD-10-CM | POA: Diagnosis not present

## 2015-08-03 DIAGNOSIS — K219 Gastro-esophageal reflux disease without esophagitis: Secondary | ICD-10-CM | POA: Insufficient documentation

## 2015-08-03 DIAGNOSIS — Z86711 Personal history of pulmonary embolism: Secondary | ICD-10-CM | POA: Diagnosis not present

## 2015-08-03 DIAGNOSIS — I10 Essential (primary) hypertension: Secondary | ICD-10-CM | POA: Diagnosis not present

## 2015-08-03 DIAGNOSIS — Z1211 Encounter for screening for malignant neoplasm of colon: Secondary | ICD-10-CM | POA: Insufficient documentation

## 2015-08-03 DIAGNOSIS — I252 Old myocardial infarction: Secondary | ICD-10-CM | POA: Insufficient documentation

## 2015-08-03 DIAGNOSIS — Z7901 Long term (current) use of anticoagulants: Secondary | ICD-10-CM | POA: Diagnosis not present

## 2015-08-03 DIAGNOSIS — D120 Benign neoplasm of cecum: Secondary | ICD-10-CM

## 2015-08-03 DIAGNOSIS — Z86010 Personal history of colonic polyps: Secondary | ICD-10-CM | POA: Insufficient documentation

## 2015-08-03 DIAGNOSIS — Z7982 Long term (current) use of aspirin: Secondary | ICD-10-CM | POA: Insufficient documentation

## 2015-08-03 DIAGNOSIS — Z87891 Personal history of nicotine dependence: Secondary | ICD-10-CM | POA: Insufficient documentation

## 2015-08-03 DIAGNOSIS — K5730 Diverticulosis of large intestine without perforation or abscess without bleeding: Secondary | ICD-10-CM | POA: Diagnosis not present

## 2015-08-03 DIAGNOSIS — Z800 Family history of malignant neoplasm of digestive organs: Secondary | ICD-10-CM | POA: Diagnosis not present

## 2015-08-03 DIAGNOSIS — Z86718 Personal history of other venous thrombosis and embolism: Secondary | ICD-10-CM | POA: Insufficient documentation

## 2015-08-03 DIAGNOSIS — R42 Dizziness and giddiness: Secondary | ICD-10-CM | POA: Insufficient documentation

## 2015-08-03 DIAGNOSIS — I2510 Atherosclerotic heart disease of native coronary artery without angina pectoris: Secondary | ICD-10-CM | POA: Diagnosis not present

## 2015-08-03 HISTORY — PX: ESOPHAGOGASTRODUODENOSCOPY: SHX5428

## 2015-08-03 HISTORY — PX: COLONOSCOPY: SHX5424

## 2015-08-03 SURGERY — COLONOSCOPY
Anesthesia: Moderate Sedation

## 2015-08-03 MED ORDER — MIDAZOLAM HCL 5 MG/5ML IJ SOLN
INTRAMUSCULAR | Status: AC
  Filled 2015-08-03: qty 10

## 2015-08-03 MED ORDER — MEPERIDINE HCL 100 MG/ML IJ SOLN
INTRAMUSCULAR | Status: DC | PRN
  Administered 2015-08-03: 50 mg via INTRAVENOUS
  Administered 2015-08-03 (×4): 25 mg via INTRAVENOUS

## 2015-08-03 MED ORDER — ONDANSETRON HCL 4 MG/2ML IJ SOLN
INTRAMUSCULAR | Status: AC
  Filled 2015-08-03: qty 2

## 2015-08-03 MED ORDER — ONDANSETRON HCL 4 MG/2ML IJ SOLN
INTRAMUSCULAR | Status: DC | PRN
  Administered 2015-08-03: 4 mg via INTRAVENOUS

## 2015-08-03 MED ORDER — MEPERIDINE HCL 100 MG/ML IJ SOLN
INTRAMUSCULAR | Status: AC
  Filled 2015-08-03: qty 2

## 2015-08-03 MED ORDER — LIDOCAINE VISCOUS 2 % MT SOLN
OROMUCOSAL | Status: AC
  Filled 2015-08-03: qty 15

## 2015-08-03 MED ORDER — MIDAZOLAM HCL 5 MG/5ML IJ SOLN
INTRAMUSCULAR | Status: DC | PRN
  Administered 2015-08-03: 2 mg via INTRAVENOUS
  Administered 2015-08-03: 1 mg via INTRAVENOUS
  Administered 2015-08-03: 2 mg via INTRAVENOUS
  Administered 2015-08-03 (×2): 1 mg via INTRAVENOUS

## 2015-08-03 MED ORDER — LIDOCAINE VISCOUS 2 % MT SOLN
OROMUCOSAL | Status: DC | PRN
  Administered 2015-08-03: 3 mL via OROMUCOSAL

## 2015-08-03 MED ORDER — SODIUM CHLORIDE 0.9 % IV SOLN
INTRAVENOUS | Status: DC
  Administered 2015-08-03: 11:00:00 via INTRAVENOUS

## 2015-08-03 MED ORDER — STERILE WATER FOR IRRIGATION IR SOLN
Status: DC | PRN
  Administered 2015-08-03: 12:00:00

## 2015-08-03 NOTE — Op Note (Signed)
Steven Oneal Patient Name: Steven Oneal Procedure Date: 08/03/2015 12:29 PM MRN: [id number] Date of Birth: 09-Nov-1945 Attending MD: Norvel Richards , MD CSN: [id number] Age: 70 Admit Type: Outpatient Procedure:                Colonoscopy Indications:              Screening for colorectal malignant neoplasm,                            Surveillance: Personal history of adenomatous                            polyps on last colonoscopy 5 years ago Providers:                Norvel Richards, MD, Gwenlyn Fudge, RN, Randa Spike, Technician Referring MD:             Kerin Perna M.D. , MD (Referring MD) Medicines:                Midazolam 5.8 mg IV, Meperidine [id number] mg IV Complications:            No immediate complications. Estimated Blood Loss:     Estimated blood loss was minimal. Procedure:                Pre-Anesthesia Assessment:                           - Prior to the procedure, a History and Physical                            was performed, and patient medications and                            allergies were reviewed. The patient's tolerance of                            previous anesthesia was also reviewed. The risks                            and benefits of the procedure and the sedation                            options and risks were discussed with the patient.                            All questions were answered, and informed consent                            was obtained. Prior Anticoagulants: The patient                            last took Coumadin (warfarin) 4 days and Lovenox                            (  enoxaparin) 1 day prior to the procedure. ASA                            Grade Assessment: II - A patient with mild systemic                            disease. After reviewing the risks and benefits,                            the patient was deemed in satisfactory condition to                            undergo the  procedure.                           - Prior to the procedure, a History and Physical                            was performed, and patient medications and                            allergies were reviewed. The patient's tolerance of                            previous anesthesia was also reviewed. The risks                            and benefits of the procedure and the sedation                            options and risks were discussed with the patient.                            All questions were answered, and informed consent                            was obtained. Prior Anticoagulants: The patient has                            taken Coumadin (warfarin), last dose was 4 days                            prior to procedure. ASA Grade Assessment: II - A                            patient with mild systemic disease. After reviewing                            the risks and benefits, the patient was deemed in                            satisfactory condition to undergo the procedure.                           -  Prior to the procedure, a History and Physical                            was performed, and patient medications and                            allergies were reviewed. The patient's tolerance of                            previous anesthesia was also reviewed. The risks                            and benefits of the procedure and the sedation                            options and risks were discussed with the patient.                            All questions were answered, and informed consent                            was obtained. Prior Anticoagulants: The patient                            last took Lovenox (enoxaparin) 1 day prior to the                            procedure. ASA Grade Assessment: II - A patient                            with mild systemic disease. After reviewing the                            risks and benefits, the patient was deemed in                             satisfactory condition to undergo the procedure.                           After obtaining informed consent, the colonoscope                            was passed under direct vision. Throughout the                            procedure, the patient's blood pressure, pulse, and                            oxygen saturations were monitored continuously. The                            [id number] scope was introduced through  the anus and advanced to the the cecum, identified                            by appendiceal orifice and ileocecal valve. The                            colonoscopy was performed without difficulty. The                            patient tolerated the procedure well. The quality                            of the bowel preparation was adequate. The                            ileocecal valve, appendiceal orifice, and rectum                            were photographed. Scope In: 12:33:20 PM Scope Out: [id number] PM Scope Withdrawal Time: 0 hours 7 minutes 42 seconds  Total Procedure Duration: 0 hours 12 minutes 53 seconds  Findings:      The perianal and digital rectal examinations were normal.      Scattered medium-mouthed diverticula were found in the entire colon.       Estimated blood loss was minimal.      A 4 mm polyp was found in the cecum. The polyp was sessile. The polyp       was removed with a cold snare. Resection and retrieval were complete. Impression:               - Diverticulosis in the entire examined colon.                           - One 4 mm polyp in the cecum, removed with a cold                            snare. Resected and retrieved. Moderate Sedation:      Moderate (conscious) sedation was administered by the endoscopy nurse       and supervised by the endoscopist. The following parameters were       monitored: oxygen saturation, heart rate, blood pressure, respiratory       rate, EKG, adequacy of  pulmonary ventilation, and response to care.       Total physician intraservice time was 35 minutes.      Moderate (conscious) sedation was administered by the endoscopy nurse       and supervised by the endoscopist. The following parameters were       monitored: oxygen saturation, heart rate, blood pressure, respiratory       rate, EKG, adequacy of pulmonary ventilation, and response to care.       Total physician intraservice time was 35 minutes. Recommendation:           - Patient has a contact number available for                            emergencies. The signs and symptoms of  potential                            delayed complications were discussed with the                            patient. Return to normal activities tomorrow.                            Written discharge instructions were provided to the                            patient.                           - Resume previous diet.                           - Continue present medications.                           - Await pathology results.                           - Repeat colonoscopy at appointment to be scheduled                            for surveillance based on pathology results. Resume                            Coumadin today. Resume Lovenox today. Continue                            Lovenox until INR therapeutic. INR to be checked on                            April 7 Procedure Code(s):        --- Professional ---                           [id number], Colonoscopy, flexible; with removal of                            tumor(s), polyp(s), or other lesion(s) by snare                            technique                           99152, Moderate sedation services provided by the                            same physician or other qualified health care                            professional performing the diagnostic or  therapeutic service that the sedation supports,                             requiring the presence of an independent trained                            observer to assist in the monitoring of the                            patient's level of consciousness and physiological                            status; initial 15 minutes of intraservice time,                            patient age 96 years or older                           [phone], Moderate sedation services; each additional                            15 minutes intraservice time Diagnosis Code(s):        --- Professional ---                           Z12.11, Encounter for screening for malignant                            neoplasm of colon                           Z86.010, Personal history of colonic polyps                           D12.0, Benign neoplasm of cecum                           K57.30, Diverticulosis of large intestine without                            perforation or abscess without bleeding CPT copyright 2016 [employer]. All rights reserved. The codes documented in this report are preliminary and upon coder review may  be revised to meet current compliance requirements. [name]. Lanya Bucks, MD Norvel Richards, MD 08/03/2015 1:05:09 PM This report has been signed electronically. Number of Addenda: 0

## 2015-08-03 NOTE — Discharge Instructions (Signed)
Colonoscopy Discharge Instructions  Read the instructions outlined below and refer to this sheet in the next few weeks. These discharge instructions provide you with general information on caring for yourself after you leave the hospital. Your doctor may also give you specific instructions. While your treatment has been planned according to the most current medical practices available, unavoidable complications occasionally occur. If you have any problems or questions after discharge, call Dr. Gala Romney at [phone]. ACTIVITY  You may resume your regular activity, but move at a slower pace for the next 24 hours.   Take frequent rest periods for the next 24 hours.   Walking will help get rid of the air and reduce the bloated feeling in your belly (abdomen).   No driving for 24 hours (because of the medicine (anesthesia) used during the test).    Do not sign any important legal documents or operate any machinery for 24 hours (because of the anesthesia used during the test).  NUTRITION  Drink plenty of fluids.   You may resume your normal diet as instructed by your doctor.   Begin with a light meal and progress to your normal diet. Heavy or fried foods are harder to digest and may make you feel sick to your stomach (nauseated).   Avoid alcoholic beverages for 24 hours or as instructed.  MEDICATIONS  You may resume your normal medications unless your doctor tells you otherwise.  WHAT YOU CAN EXPECT TODAY  Some feelings of bloating in the abdomen.   Passage of more gas than usual.   Spotting of blood in your stool or on the toilet paper.  IF YOU HAD POLYPS REMOVED DURING THE COLONOSCOPY:  No aspirin products for 7 days or as instructed.   No alcohol for 7 days or as instructed.   Eat a soft diet for the next 24 hours.  FINDING OUT THE RESULTS OF YOUR TEST Not all test results are available during your visit. If your test results are not back during the visit, make an appointment  with your caregiver to find out the results. Do not assume everything is normal if you have not heard from your caregiver or the medical facility. It is important for you to follow up on all of your test results.  SEEK IMMEDIATE MEDICAL ATTENTION IF:  You have more than a spotting of blood in your stool.   Your belly is swollen (abdominal distention).   You are nauseated or vomiting.   You have a temperature over 101.   You have abdominal pain or discomfort that is severe or gets worse throughout the day.   EGD Discharge instructions Please read the instructions outlined below and refer to this sheet in the next few weeks. These discharge instructions provide you with general information on caring for yourself after you leave the hospital. Your doctor may also give you specific instructions. While your treatment has been planned according to the most current medical practices available, unavoidable complications occasionally occur. If you have any problems or questions after discharge, please call your doctor. ACTIVITY  You may resume your regular activity but move at a slower pace for the next 24 hours.   Take frequent rest periods for the next 24 hours.   Walking will help expel (get rid of) the air and reduce the bloated feeling in your abdomen.   No driving for 24 hours (because of the anesthesia (medicine) used during the test).   You may shower.   Do not sign any  important legal documents or operate any machinery for 24 hours (because of the anesthesia used during the test).  NUTRITION  Drink plenty of fluids.   You may resume your normal diet.   Begin with a light meal and progress to your normal diet.   Avoid alcoholic beverages for 24 hours or as instructed by your caregiver.  MEDICATIONS  You may resume your normal medications unless your caregiver tells you otherwise.  WHAT YOU CAN EXPECT TODAY  You may experience abdominal discomfort such as a feeling of  fullness or gas pains.  FOLLOW-UP  Your doctor will discuss the results of your test with you.  SEEK IMMEDIATE MEDICAL ATTENTION IF ANY OF THE FOLLOWING OCCUR:  Excessive nausea (feeling sick to your stomach) and/or vomiting.   Severe abdominal pain and distention (swelling).   Trouble swallowing.   Temperature over 101 F (37.8 C).   Rectal bleeding or vomiting of blood.   Diverticulosis and colon polyp information provided  Further recommendations to follow pending review of pathology report  Resume Coumadin today  Resume Lovenox today-continue Lovenox until INR therapeutic.  Check INR on April 9 Colon Polyps Polyps are lumps of extra tissue growing inside the body. Polyps can grow in the large intestine (colon). Most colon polyps are noncancerous (benign). However, some colon polyps can become cancerous over time. Polyps that are larger than a pea may be harmful. To be safe, caregivers remove and test all polyps. CAUSES  Polyps form when mutations in the genes cause your cells to grow and divide even though no more tissue is needed. RISK FACTORS There are a number of risk factors that can increase your chances of getting colon polyps. They include: Being older than 50 years. Family history of colon polyps or colon cancer. Long-term colon diseases, such as colitis or Crohn disease. Being overweight. Smoking. Being inactive. Drinking too much alcohol. SYMPTOMS  Most small polyps do not cause symptoms. If symptoms are present, they may include: Blood in the stool. The stool may look dark red or black. Constipation or diarrhea that lasts longer than 1 week. DIAGNOSIS People often do not know they have polyps until their caregiver finds them during a regular checkup. Your caregiver can use 4 tests to check for polyps: Digital rectal exam. The caregiver wears gloves and feels inside the rectum. This test would find polyps only in the rectum. Barium enema. The caregiver  puts a liquid called barium into your rectum before taking X-rays of your colon. Barium makes your colon look white. Polyps are dark, so they are easy to see in the X-ray pictures. Sigmoidoscopy. A thin, flexible tube (sigmoidoscope) is placed into your rectum. The sigmoidoscope has a light and tiny camera in it. The caregiver uses the sigmoidoscope to look at the last third of your colon. Colonoscopy. This test is like sigmoidoscopy, but the caregiver looks at the entire colon. This is the most common method for finding and removing polyps. TREATMENT  Any polyps will be removed during a sigmoidoscopy or colonoscopy. The polyps are then tested for cancer. PREVENTION  To help lower your risk of getting more colon polyps: Eat plenty of fruits and vegetables. Avoid eating fatty foods. Do not smoke. Avoid drinking alcohol. Exercise every day. Lose weight if recommended by your caregiver. Eat plenty of calcium and folate. Foods that are rich in calcium include milk, cheese, and broccoli. Foods that are rich in folate include chickpeas, kidney beans, and spinach. HOME CARE INSTRUCTIONS Keep all follow-up  appointments as directed by your caregiver. You may need periodic exams to check for polyps. SEEK MEDICAL CARE IF: You notice bleeding during a bowel movement.   This information is not intended to replace advice given to you by your health care provider. Make sure you discuss any questions you have with your health care provider.   Document Released: 01/12/2004 Document Revised: 05/08/2014 Document Reviewed: 06/27/2011 Elsevier Interactive Patient Education 2016 [employer]. Diverticulosis Diverticulosis is the condition that develops when small pouches (diverticula) form in the wall of your colon. Your colon, or large intestine, is where water is absorbed and stool is formed. The pouches form when the inside layer of your colon pushes through weak spots in the outer layers of your colon. CAUSES   No one knows exactly what causes diverticulosis. RISK FACTORS Being older than 44. Your risk for this condition increases with age. Diverticulosis is rare in people younger than 40 years. By age 17, almost everyone has it. Eating a low-fiber diet. Being frequently constipated. Being overweight. Not getting enough exercise. Smoking. Taking over-the-counter pain medicines, like aspirin and ibuprofen. SYMPTOMS  Most people with diverticulosis do not have symptoms. DIAGNOSIS  Because diverticulosis often has no symptoms, health care providers often discover the condition during an exam for other colon problems. In many cases, a health care provider will diagnose diverticulosis while using a flexible scope to examine the colon (colonoscopy). TREATMENT  If you have never developed an infection related to diverticulosis, you may not need treatment. If you have had an infection before, treatment may include: Eating more fruits, vegetables, and grains. Taking a fiber supplement. Taking a live bacteria supplement (probiotic). Taking medicine to relax your colon. HOME CARE INSTRUCTIONS  Drink at least 6-8 glasses of water each day to prevent constipation. Try not to strain when you have a bowel movement. Keep all follow-up appointments. If you have had an infection before: Increase the fiber in your diet as directed by your health care provider or dietitian. Take a dietary fiber supplement if your health care provider approves. Only take medicines as directed by your health care provider. SEEK MEDICAL CARE IF:  You have abdominal pain. You have bloating. You have cramps. You have not gone to the bathroom in 3 days. SEEK IMMEDIATE MEDICAL CARE IF:  Your pain gets worse. Yourbloating becomes very bad. You have a fever or chills, and your symptoms suddenly get worse. You begin vomiting. You have bowel movements that are bloody or black. MAKE SURE YOU: Understand these instructions. Will  watch your condition. Will get help right away if you are not doing well or get worse.   This information is not intended to replace advice given to you by your health care provider. Make sure you discuss any questions you have with your health care provider.   Document Released: 01/13/2004 Document Revised: 04/22/2013 Document Reviewed: 03/12/2013 Elsevier Interactive Patient Education [employer].

## 2015-08-03 NOTE — Interval H&P Note (Signed)
History and Physical Interval Note:  08/03/2015 12:01 PM  Steven Oneal  has presented today for surgery, with the diagnosis of HISTORY OF COLON POLYPS/EARLY SATIETY  The various methods of treatment have been discussed with the patient and family. After consideration of risks, benefits and other options for treatment, the patient has consented to  Procedure(s) with comments: COLONOSCOPY (N/A) - 1230 ESOPHAGOGASTRODUODENOSCOPY (EGD) (N/A) as a surgical intervention .  The patient's history has been reviewed, patient examined, no change in status, stable for surgery.  I have reviewed the patient's chart and labs.  Questions were answered to the patient's satisfaction.     Devora Tortorella  No change. Persisting early satiety. EGD and colonoscopy today per plan.  The risks, benefits, limitations, imponderables and alternatives regarding both EGD and colonoscopy have been reviewed with the patient. Questions have been answered. All parties agreeable.

## 2015-08-03 NOTE — Op Note (Signed)
[facility] Patient Name: Steven Oneal Procedure Date: 08/03/2015 12:01 PM MRN: [id number] Date of Birth: [birth year] Attending MD: Norvel Richards , MD CSN: [id number] Age: 70 Admit Type: Outpatient Procedure:                Upper GI endoscopy Indications:              Early satiety Providers:                Norvel Richards, MD, Gwenlyn Fudge, RN, Randa Spike, Technician Referring MD:             Kerin Perna M.D. , MD (Referring MD) Medicines:                Midazolam 7 mg IV, Meperidine 150 mg IV,                            Ondansetron 4 mg IV Complications:            No immediate complications. Estimated Blood Loss:     Estimated blood loss: none. Procedure:                Pre-Anesthesia Assessment:                           - Prior to the procedure, a History and Physical                            was performed, and patient medications and                            allergies were reviewed. The patient's tolerance of                            previous anesthesia was also reviewed. The risks                            and benefits of the procedure and the sedation                            options and risks were discussed with the patient.                            All questions were answered, and informed consent                            was obtained. Prior Anticoagulants: The patient                            last took Coumadin (warfarin) 4 days and Lovenox                            (enoxaparin) 1 day prior to the procedure. ASA  Grade Assessment: II - A patient with mild systemic                            disease. After reviewing the risks and benefits,                            the patient was deemed in satisfactory condition to                            undergo the procedure.                           After obtaining informed consent, the endoscope was                            passed under  direct vision. Throughout the                            procedure, the patient's blood pressure, pulse, and                            oxygen saturations were monitored continuously. The                            [id number]) was introduced through the                            mouth, and advanced to the second part of duodenum.                            The upper GI endoscopy was accomplished without                            difficulty. The patient tolerated the procedure                            well. Scope In: 12:24:42 PM Scope Out: 12:28:49 PM Total Procedure Duration: 0 hours 4 minutes 7 seconds  Findings:      The examined esophagus was normal.      A small hiatal hernia was present.      The exam was otherwise without abnormality.      The second portion of the duodenum was normal. Estimated blood loss:       none. Impression:               - Normal esophagus.                           - Small hiatal hernia.                           - The examination was otherwise normal.                           - Normal second portion of the duodenum.                           -  No specimens collected. Moderate Sedation:      Moderate (conscious) sedation was administered by the endoscopy nurse       and supervised by the endoscopist. The following parameters were       monitored: oxygen saturation, heart rate, blood pressure, respiratory       rate, EKG, adequacy of pulmonary ventilation, and response to care.       Total physician intraservice time was 35 minutes. Recommendation:           - Resume previous diet.                           - Continue present medications.                           - see colonoscopy report. Further recommendations                            to follow. Procedure Code(s):        --- Professional ---                           [phone], Esophagogastroduodenoscopy, flexible,                            transoral; diagnostic, including collection of                             specimen(s) by brushing or washing, when performed                            (separate procedure)                           99152, Moderate sedation services provided by the                            same physician or other qualified health care                            professional performing the diagnostic or                            therapeutic service that the sedation supports,                            requiring the presence of an independent trained                            observer to assist in the monitoring of the                            patient's level of consciousness and physiological                            status; initial 15 minutes of intraservice time,  patient age 57 years or older                           [phone], Moderate sedation services; each additional                            15 minutes intraservice time Diagnosis Code(s):        --- Professional ---                           K44.9, Diaphragmatic hernia without obstruction or                            gangrene                           R68.81, Early satiety CPT copyright 2016 [employer]. All rights reserved. The codes documented in this report are preliminary and upon coder review may  be revised to meet current compliance requirements. [name]. [name], MD Norvel Richards, MD 08/03/2015 12:58:18 PM This report has been signed electronically. Number of Addenda: 0

## 2015-08-03 NOTE — H&P (View-Only) (Signed)
Primary Care Physician:  Glo Herring., MD  Primary Gastroenterologist:  Garfield Cornea, MD   Chief Complaint  Patient presents with  . set up TCS    HPI:  Steven Oneal is a 70 y.o. male here to schedule surveillance colonoscopy. Patient has a personal history of adenomatous colon polyps as well as multiple family members with colon cancer at early age. Patient's last colonoscopy was in April 2012. Also had an EGD at that time. EGD was normal except for small hiatal hernia. He had pancolonic diverticula, tubular adenoma removed from the sigmoid colon.  Clinically he continues to have early satiety/bloating postprandially. This is been gone on for several months. When I saw him last year we treated him empirically for diverticulitis and GERD symptoms seem to resolve at the time. He is not a diabetic. He takes aspirin daily but no other NSAIDs. Bowel movements are regular. No melena rectal bleeding. He does have some postprandial upper abdominal pain. No dysphagia. No heartburn.  Patient had left lower extremity DVT and pulmonary embolus back in 2014. He is on chronic Coumadin therapy.  Current Outpatient Prescriptions  Medication Sig Dispense Refill  . amLODipine (NORVASC) 5 MG tablet TAKE ONE TABLET BY MOUTH ONCE DAILY 90 tablet 1  . aspirin EC 81 MG tablet Take 81 mg by mouth daily.    . diazepam (VALIUM) 10 MG tablet Take 10 mg by mouth every 6 (six) hours as needed for anxiety.     . fish oil-omega-3 fatty acids 1000 MG capsule Take 3 g by mouth daily.      Marland Kitchen losartan (COZAAR) 50 MG tablet TAKE ONE TABLET BY MOUTH ONCE DAILY 90 tablet 1  . meclizine (ANTIVERT) 25 MG tablet Take 25 mg by mouth as needed for dizziness.     . metoprolol tartrate (LOPRESSOR) 25 MG tablet Take 0.5 tablets (12.5 mg total) by mouth 2 (two) times daily. 30 tablet 6  . Multiple Vitamin (MULTIVITAMIN WITH MINERALS) TABS tablet Take 1 tablet by mouth daily.    . pantoprazole (PROTONIX) 40 MG tablet Take 1  tablet (40 mg total) by mouth daily. 30 tablet 5  . pravastatin (PRAVACHOL) 80 MG tablet TAKE ONE TABLET BY MOUTH ONCE DAILY 90 tablet 2  . warfarin (COUMADIN) 4 MG tablet Take 1 tablet by mouth as directed. Daily    . carvedilol (COREG) 6.25 MG tablet Take 1 tablet (6.25 mg total) by mouth 2 (two) times daily with a meal. 180 tablet 1   No current facility-administered medications for this visit.    Allergies as of 07/12/2015 - Review Complete 07/12/2015  Allergen Reaction Noted  . Codeine Itching, Nausea Only, and Rash 08/01/2010    Past Medical History  Diagnosis Date  . Allergic rhinitis   . GERD (gastroesophageal reflux disease)   . Essential hypertension   . Hyperlipidemia   . History of DVT (deep vein thrombosis) July 2014  . History of pulmonary embolus (PE) July 2014  . On continuous oral anticoagulation   . CAD (coronary artery disease)     DES Cypher x 2 RCA 2007; Repeat LHC 06/2014 angiographically minimal CAD with widely patent RCA stents EF 60-65%   . Myocardial infarction ([place]) 2007  . Vertigo     Past Surgical History  Procedure Laterality Date  . Appendectomy    . Colonoscopy  11/2003    Single anal papilla and int hemorrhoids, pancolonic tics  . Coronary angioplasty with stent placement  06/15/2005    Cypher  stent to mid RCA; cypher stent -ostial RCA, RESIDUAL disease LAD  . Cardiac catheterization  10/09/2005    Patent stents, medical therapy  . Cardiac catheterization  10/22/2006    Nonobstructive disease, 60% LAD, patent RCA stents  . Cholecystectomy N/A 07/14/2013    Procedure: LAPAROSCOPIC CHOLECYSTECTOMY;  Surgeon: Jamesetta So, MD;  Location: [facility];  Service: General;  Laterality: N/A;  . Left heart catheterization with coronary angiogram N/A 06/12/2014    Procedure: LEFT HEART CATHETERIZATION WITH CORONARY ANGIOGRAM;  Surgeon: Leonie Man, MD;  Location: [facility] CATH LAB;  Service: Cardiovascular;  Laterality: N/A;  . Colonoscopy  2012    RMR: Single  anal canal tag, otherwise normal rectum. Pan colonic diverticula, diminutive polyp, transverse colon status post cold biopsy removeal, polyp in the sigmoid status post hot snare polypectomy   . Esophagogastroduodenoscopy  2012    RMR: EGD normal esophagus, small hiatal hernia, otherwise normal stomach D1 and D2.     Family History  Problem Relation Age of Onset  . Colon cancer Mother 60  . Colon cancer Paternal Uncle 59  . Colon cancer Brother 53    Rectal cancer  . Colon cancer Paternal Uncle 51  . Throat cancer Brother 68  . Prostate cancer Brother   . Pulmonary embolism Brother     Cancer    Social History   Social History  . Marital Status: Married    Spouse Name: N/A  . Number of Children: 2  . Years of Education: N/A   Occupational History  . disablity     CAD   Social History Main Topics  . Smoking status: Former Smoker -- 0.10 packs/day for 13 years    Types: Cigarettes    Quit date: 05/27/1977  . Smokeless tobacco: Never Used  . Alcohol Use: No  . Drug Use: No  . Sexual Activity:    Partners: Female    Museum/gallery curator: None     Comment: spouse   Other Topics Concern  . Not on file   Social History Narrative      ROS:  General: Negative for anorexia, weight loss, fever, chills, fatigue, weakness. Eyes: Negative for vision changes.  ENT: Negative for hoarseness, difficulty swallowing , nasal congestion. CV: Negative for chest pain, angina, palpitations, dyspnea on exertion, peripheral edema.  Respiratory: Negative for dyspnea at rest, dyspnea on exertion, cough, sputum, wheezing.  GI: See history of present illness. GU:  Negative for dysuria, hematuria, urinary incontinence, urinary frequency, nocturnal urination.  MS: Negative for joint pain, low back pain.  Derm: Negative for rash or itching.  Neuro: Negative for weakness, abnormal sensation, seizure, frequent headaches, memory loss, confusion.  Psych: Negative for anxiety, depression,  suicidal ideation, hallucinations.  Endo: Negative for unusual weight change.  Heme: Negative for bruising or bleeding. Allergy: Negative for rash or hives.    Physical Examination:  BP 145/77 mmHg  Pulse 80  Temp(Src) 98 F (36.7 C)  Ht 5\' 9"  (1.753 m)  Wt 236 lb 3.2 oz (107.14 kg)  BMI 34.86 kg/m2   General: Well-nourished, well-developed in no acute distress.  Head: Normocephalic, atraumatic.   Eyes: Conjunctiva pink, no icterus. Mouth: Oropharyngeal mucosa moist and pink , no lesions erythema or exudate. Neck: Supple without thyromegaly, masses, or lymphadenopathy.  Lungs: Clear to auscultation bilaterally.  Heart: Regular rate and rhythm, no murmurs rubs or gallops.  Abdomen: Bowel sounds are normal, nontender, nondistended, no hepatosplenomegaly or masses, no abdominal bruits or  hernia , no rebound or guarding.   Rectal: Deferred Extremities: No lower extremity edema. No clubbing or deformities.  Neuro: Alert and oriented x 4 , grossly normal neurologically.  Skin: Warm and dry, no rash or jaundice.   Psych: Alert and cooperative, normal mood and affect.

## 2015-08-04 DIAGNOSIS — Z1389 Encounter for screening for other disorder: Secondary | ICD-10-CM | POA: Diagnosis not present

## 2015-08-04 DIAGNOSIS — Z7901 Long term (current) use of anticoagulants: Secondary | ICD-10-CM | POA: Diagnosis not present

## 2015-08-04 DIAGNOSIS — R51 Headache: Secondary | ICD-10-CM | POA: Diagnosis not present

## 2015-08-05 ENCOUNTER — Encounter: Payer: Self-pay | Admitting: Internal Medicine

## 2015-08-06 DIAGNOSIS — J019 Acute sinusitis, unspecified: Secondary | ICD-10-CM | POA: Diagnosis not present

## 2015-08-06 DIAGNOSIS — E782 Mixed hyperlipidemia: Secondary | ICD-10-CM | POA: Diagnosis not present

## 2015-08-06 DIAGNOSIS — Z7901 Long term (current) use of anticoagulants: Secondary | ICD-10-CM | POA: Diagnosis not present

## 2015-08-06 DIAGNOSIS — I10 Essential (primary) hypertension: Secondary | ICD-10-CM | POA: Diagnosis not present

## 2015-08-06 DIAGNOSIS — Z1389 Encounter for screening for other disorder: Secondary | ICD-10-CM | POA: Diagnosis not present

## 2015-08-06 DIAGNOSIS — Z125 Encounter for screening for malignant neoplasm of prostate: Secondary | ICD-10-CM | POA: Diagnosis not present

## 2015-08-06 DIAGNOSIS — I2699 Other pulmonary embolism without acute cor pulmonale: Secondary | ICD-10-CM | POA: Diagnosis not present

## 2015-08-10 ENCOUNTER — Encounter (HOSPITAL_COMMUNITY): Payer: Self-pay | Admitting: Internal Medicine

## 2015-08-13 DIAGNOSIS — Z7901 Long term (current) use of anticoagulants: Secondary | ICD-10-CM | POA: Diagnosis not present

## 2015-09-07 ENCOUNTER — Other Ambulatory Visit: Payer: Self-pay | Admitting: Cardiovascular Disease

## 2015-09-08 NOTE — Telephone Encounter (Signed)
Rx has been sent to the pharmacy electronically. ° °

## 2015-09-16 DIAGNOSIS — Z7901 Long term (current) use of anticoagulants: Secondary | ICD-10-CM | POA: Diagnosis not present

## 2015-10-08 DIAGNOSIS — K7689 Other specified diseases of liver: Secondary | ICD-10-CM | POA: Diagnosis not present

## 2015-10-11 ENCOUNTER — Other Ambulatory Visit: Payer: Self-pay | Admitting: Cardiovascular Disease

## 2015-10-19 ENCOUNTER — Ambulatory Visit (INDEPENDENT_AMBULATORY_CARE_PROVIDER_SITE_OTHER): Payer: Medicare Other | Admitting: Cardiovascular Disease

## 2015-10-19 ENCOUNTER — Encounter: Payer: Self-pay | Admitting: Cardiovascular Disease

## 2015-10-19 VITALS — BP 128/74 | HR 76 | Ht 69.0 in | Wt 232.6 lb

## 2015-10-19 DIAGNOSIS — I2510 Atherosclerotic heart disease of native coronary artery without angina pectoris: Secondary | ICD-10-CM

## 2015-10-19 DIAGNOSIS — E785 Hyperlipidemia, unspecified: Secondary | ICD-10-CM

## 2015-10-19 DIAGNOSIS — I10 Essential (primary) hypertension: Secondary | ICD-10-CM | POA: Diagnosis not present

## 2015-10-19 DIAGNOSIS — Z9861 Coronary angioplasty status: Secondary | ICD-10-CM

## 2015-10-19 NOTE — Patient Instructions (Signed)

## 2015-10-19 NOTE — Assessment & Plan Note (Signed)
History of CAD status post PCI and stenting of his RCA by myself February 2007 the setting of a myocardial infarction. He was restudied June 2008, having stents with normal LV function. He had a Myoview stress test performed 05/08/13 showed inferobasal scar with mild inferolateral hypokinesia unchanged from prior functional studies. He denies chest pain or shortness of breath.

## 2015-10-19 NOTE — Assessment & Plan Note (Signed)
History of hyperlipidemia on pravastatin followed by his PCP 

## 2015-10-19 NOTE — Assessment & Plan Note (Signed)
History of DVT and pulmonary embolus on Coumadin anticoagulation.

## 2015-10-19 NOTE — Assessment & Plan Note (Addendum)
History of hypertension blood pressure measures 120/74. He is on amlodipine, carvedilol and losartan. Continue current meds at current dosing

## 2015-10-19 NOTE — Progress Notes (Signed)
10/19/2015 Steven Oneal   1945/06/05  [id number]  Primary Physician Glo Herring., MD Primary Cardiologist: Lorretta Harp MD [name]  HPI:   70 y/o male who has I last saw in the office 10/07/14. He has a history of CAD status post PCI and stenting of his RCA by Dr [name] Feb 2007 in the setting of an MI. He was restudied in June 2008, and had patent stents in his proximal RCA and noncritical disease in his proximal LAD with normal LV function. He has not had a Myoview since and has done well from a cardiac standpoint. He did have a PE/DVT in July 2014 and is on chronic Coumadin. He recently saw [name] after he had palpitations and chest pain Thanksgiving night. The pt says his heart was racing, he was sweaty, and he had pain across his chest. He told me "thought I was having a heart attack". [name] increased his Coreg and the pt stopped his Niaspan suspecting this had something to do with his symptoms, he had been on this since 2008. Since he stopped the Niaspan he has done well. He denies any further chest pain or palpations. His Holter showed PACs, PVCs, and sinus arrythmia with transient bradycardia and sinus pauses up to 2.7 seconds. A Myoview stress test performed 05/08/13 showed inferobasal scar with mild inferolateral hypokinesis unchanged from prior functional studies. Since I saw him last he has remained asymptomatic.   Current Outpatient Prescriptions  Medication Sig Dispense Refill  . Alpha Lipoic Acid 200 MG CAPS Take 1 capsule by mouth daily.    Marland Kitchen amLODipine (NORVASC) 5 MG tablet TAKE ONE TABLET BY MOUTH ONCE DAILY 90 tablet 0  . aspirin EC 81 MG tablet Take 81 mg by mouth daily.    . carvedilol (COREG) 6.25 MG tablet Take 1 tablet (6.25 mg total) by mouth 2 (two) times daily with a meal. 180 tablet 1  . diazepam (VALIUM) 10 MG tablet Take 10 mg by mouth at bedtime as needed for anxiety or sleep.     . fish oil-omega-3 fatty acids 1000 MG capsule  Take 1 g by mouth 3 (three) times daily.     Marland Kitchen losartan (COZAAR) 50 MG tablet TAKE ONE TABLET BY MOUTH ONCE DAILY 90 tablet 1  . meclizine (ANTIVERT) 25 MG tablet Take 25 mg by mouth as needed for dizziness (FOR INNER EAR).     . metoprolol tartrate (LOPRESSOR) 25 MG tablet Take 0.5 tablets (12.5 mg total) by mouth 2 (two) times daily. 30 tablet 6  . Multiple Vitamin (MULTIVITAMIN WITH MINERALS) TABS tablet Take 1 tablet by mouth daily.    Marland Kitchen omeprazole (PRILOSEC) 20 MG capsule Take 20 mg by mouth daily.    . pravastatin (PRAVACHOL) 80 MG tablet TAKE ONE TABLET BY MOUTH ONCE DAILY 90 tablet 0  . warfarin (COUMADIN) 4 MG tablet Take 1 tablet by mouth daily. Daily     No current facility-administered medications for this visit.    Allergies  Allergen Reactions  . Codeine Itching, Nausea Only and Rash  . Niaspan [Niacin Er] Other (See Comments)    MAKES SKIN FLUSH EXCESSIVELY    Social History   Social History  . Marital Status: Married    Spouse Name: N/A  . Number of Children: 2  . Years of Education: N/A   Occupational History  . disablity     CAD   Social History Main Topics  . Smoking status: Former  Smoker -- 0.10 packs/day for 13 years    Types: Cigarettes    Quit date: 05/27/1977  . Smokeless tobacco: Never Used  . Alcohol Use: No  . Drug Use: No  . Sexual Activity:    Partners: Female    Museum/gallery curator: None     Comment: spouse   Other Topics Concern  . Not on file   Social History Narrative     Review of Systems: General: negative for chills, fever, night sweats or weight changes.  Cardiovascular: negative for chest pain, dyspnea on exertion, edema, orthopnea, palpitations, paroxysmal nocturnal dyspnea or shortness of breath Dermatological: negative for rash Respiratory: negative for cough or wheezing Urologic: negative for hematuria Abdominal: negative for nausea, vomiting, diarrhea, bright red blood per rectum, melena, or  hematemesis Neurologic: negative for visual changes, syncope, or dizziness All other systems reviewed and are otherwise negative except as noted above.    Blood pressure 128/74, pulse 76, height 5\' 9"  (1.753 m), weight 232 lb 9.6 oz (105.507 kg).  General appearance: alert and no distress Neck: no adenopathy, no carotid bruit, no JVD, supple, symmetrical, trachea midline and thyroid not enlarged, symmetric, no tenderness/mass/nodules Lungs: clear to auscultation bilaterally Heart: regular rate and rhythm, S1, S2 normal, no murmur, click, rub or gallop Extremities: extremities normal, atraumatic, no cyanosis or edema  EKG normal sinus rhythm at 76orally progression and evidence of LVH.sonally reviewed this EKG  ASSESSMENT AND PLAN:   Coronary artery disease- RCA PCI in 2007; last cath 06/2014 w/ minimal CAD w/ widely patent RCA stents History of CAD status post PCI and stenting of his RCA by myself February 2007 the setting of a myocardial infarction. He was restudied June 2008, having stents with normal LV function. He had a Myoview stress test performed 05/08/13 showed inferobasal scar with mild inferolateral hypokinesia unchanged from prior functional studies. He denies chest pain or shortness of breath.  Pulmonary embolus History of DVT and pulmonary embolus on Coumadin anticoagulation.  Essential hypertension History of hypertension blood pressure measures 120/74. He is on amlodipine, carvedilol and losartan. Continue current meds at current dosing  Hyperlipidemia History of hyperlipidemia on pravastatin followed by his PCP      Lorretta Harp MD [facility], [facility] 10/19/2015 3:05 PM

## 2015-11-12 DIAGNOSIS — Z7901 Long term (current) use of anticoagulants: Secondary | ICD-10-CM | POA: Diagnosis not present

## 2015-12-13 ENCOUNTER — Other Ambulatory Visit: Payer: Self-pay | Admitting: Cardiovascular Disease

## 2015-12-15 DIAGNOSIS — Z7901 Long term (current) use of anticoagulants: Secondary | ICD-10-CM | POA: Diagnosis not present

## 2015-12-31 DIAGNOSIS — Z7901 Long term (current) use of anticoagulants: Secondary | ICD-10-CM | POA: Diagnosis not present

## 2016-01-13 ENCOUNTER — Other Ambulatory Visit: Payer: Self-pay | Admitting: Cardiovascular Disease

## 2016-02-01 DIAGNOSIS — Z7901 Long term (current) use of anticoagulants: Secondary | ICD-10-CM | POA: Diagnosis not present

## 2016-03-07 DIAGNOSIS — E291 Testicular hypofunction: Secondary | ICD-10-CM | POA: Diagnosis not present

## 2016-03-07 DIAGNOSIS — Z7901 Long term (current) use of anticoagulants: Secondary | ICD-10-CM | POA: Diagnosis not present

## 2016-03-13 DIAGNOSIS — R7989 Other specified abnormal findings of blood chemistry: Secondary | ICD-10-CM | POA: Diagnosis not present

## 2016-03-29 DIAGNOSIS — H81392 Other peripheral vertigo, left ear: Secondary | ICD-10-CM | POA: Diagnosis not present

## 2016-03-29 DIAGNOSIS — E782 Mixed hyperlipidemia: Secondary | ICD-10-CM | POA: Diagnosis not present

## 2016-03-29 DIAGNOSIS — J328 Other chronic sinusitis: Secondary | ICD-10-CM | POA: Diagnosis not present

## 2016-03-29 DIAGNOSIS — I10 Essential (primary) hypertension: Secondary | ICD-10-CM | POA: Diagnosis not present

## 2016-03-29 DIAGNOSIS — H6502 Acute serous otitis media, left ear: Secondary | ICD-10-CM | POA: Diagnosis not present

## 2016-03-29 DIAGNOSIS — Z1389 Encounter for screening for other disorder: Secondary | ICD-10-CM | POA: Diagnosis not present

## 2016-04-10 DIAGNOSIS — R7989 Other specified abnormal findings of blood chemistry: Secondary | ICD-10-CM | POA: Diagnosis not present

## 2016-04-12 ENCOUNTER — Other Ambulatory Visit: Payer: Self-pay | Admitting: Cardiovascular Disease

## 2016-05-15 DIAGNOSIS — R7989 Other specified abnormal findings of blood chemistry: Secondary | ICD-10-CM | POA: Diagnosis not present

## 2016-06-15 DIAGNOSIS — Z7901 Long term (current) use of anticoagulants: Secondary | ICD-10-CM | POA: Diagnosis not present

## 2016-06-15 DIAGNOSIS — R7989 Other specified abnormal findings of blood chemistry: Secondary | ICD-10-CM | POA: Diagnosis not present

## 2016-06-22 ENCOUNTER — Other Ambulatory Visit: Payer: Self-pay | Admitting: Cardiovascular Disease

## 2016-07-20 DIAGNOSIS — R7989 Other specified abnormal findings of blood chemistry: Secondary | ICD-10-CM | POA: Diagnosis not present

## 2016-07-21 ENCOUNTER — Ambulatory Visit (INDEPENDENT_AMBULATORY_CARE_PROVIDER_SITE_OTHER): Payer: Medicare Other | Admitting: Urology

## 2016-07-21 DIAGNOSIS — N5201 Erectile dysfunction due to arterial insufficiency: Secondary | ICD-10-CM | POA: Diagnosis not present

## 2016-07-27 ENCOUNTER — Encounter: Payer: Self-pay | Admitting: *Deleted

## 2016-07-27 ENCOUNTER — Ambulatory Visit: Payer: Medicare Other | Admitting: Cardiology

## 2016-07-28 DIAGNOSIS — Z7901 Long term (current) use of anticoagulants: Secondary | ICD-10-CM | POA: Diagnosis not present

## 2016-08-03 ENCOUNTER — Telehealth: Payer: Self-pay | Admitting: Cardiovascular Disease

## 2016-08-03 NOTE — Telephone Encounter (Signed)
Requesting surgical clearance:  1. Type of surgery:   Note from Dr. Jeffie Pollock: --He has severe ED that no longer responds to PDE5's plus he is on isosorbide. He is on Warfarin as well. He could use a VED if careful or injections and I discussed both of those. I also discussed semirigid and inflatable prosthesis which I think is appropriate in his situation. I reviewed the risks of bleeding, infection, penile or urethral injury, glanular deformity, chronic pain, erosion, thrombotic events, and anesthetic complications. I will get him cleared by Dr. [name] and encouraged him to try to lose a few pounds.  2. Surgeon: Dr. Irine Seal  3.Surgical Date:  pending  4. Medications that need to be held:   Warfarin 4 mg   5. CAD: Yes  6. I will defer to:  Dr. Pearla Dubonnet Information:  [facility] Phone:  [phone] Fax:  [phone]

## 2016-08-09 NOTE — Telephone Encounter (Signed)
Cleared for surgery at low risk. Okay to interrupt warfarin for his procedure

## 2016-08-14 NOTE — Telephone Encounter (Signed)
Hold Warfarin for 5 days prior?

## 2016-08-15 ENCOUNTER — Ambulatory Visit (INDEPENDENT_AMBULATORY_CARE_PROVIDER_SITE_OTHER): Payer: Medicare Other | Admitting: Cardiology

## 2016-08-15 ENCOUNTER — Encounter: Payer: Self-pay | Admitting: Cardiology

## 2016-08-15 VITALS — BP 139/79 | HR 76 | Ht 69.5 in | Wt 239.8 lb

## 2016-08-15 DIAGNOSIS — Z9861 Coronary angioplasty status: Secondary | ICD-10-CM | POA: Diagnosis not present

## 2016-08-15 DIAGNOSIS — I10 Essential (primary) hypertension: Secondary | ICD-10-CM

## 2016-08-15 DIAGNOSIS — Z7901 Long term (current) use of anticoagulants: Secondary | ICD-10-CM | POA: Diagnosis not present

## 2016-08-15 DIAGNOSIS — E785 Hyperlipidemia, unspecified: Secondary | ICD-10-CM

## 2016-08-15 DIAGNOSIS — I2510 Atherosclerotic heart disease of native coronary artery without angina pectoris: Secondary | ICD-10-CM

## 2016-08-15 DIAGNOSIS — Z86711 Personal history of pulmonary embolism: Secondary | ICD-10-CM | POA: Diagnosis not present

## 2016-08-15 MED ORDER — METOPROLOL TARTRATE 25 MG PO TABS: 25.0000 mg | ORAL_TABLET | Freq: Two times a day (BID) | ORAL | 6 refills | Status: DC

## 2016-08-15 NOTE — Assessment & Plan Note (Signed)
Coumadin followed by Dr Gerarda Fraction

## 2016-08-15 NOTE — Assessment & Plan Note (Signed)
Lt LL PE by [facility]A July 2014- not seen on CT in 2016 

## 2016-08-15 NOTE — Progress Notes (Signed)
08/15/2016 Steven Oneal   [birth year]  [id number]  Primary Physician Glo Herring, MD Primary Cardiologist: Dr [name]  HPI:  71 y/o male who has a history of CAD, status post PCI and stenting of his RCA by Dr [name] Feb 2007 in the setting of an MI. He was restudied in June 2008, and had patent stents in his proximal RCA and noncritical disease in his proximal LAD with normal LV function. He did have a PE/DVT in July 2014 and has been on  Coumadin since. CT scan in 2016 did not show a PE.  A Myoview stress test performed 05/08/13 showed inferobasal scar with mild inferolateral hypokinesis unchanged from prior functional studies.  Cath done 06/12/14 for chest pain showed patent coronaries.  He is in the office for a 6 month check up. He denies angina. He denies palpitations. He is to have a penile implant (Dr Jeffie Pollock) and Dr [name] has cleared him for this.    Current Outpatient Prescriptions  Medication Sig Dispense Refill  . Alpha Lipoic Acid 200 MG CAPS Take 1 capsule by mouth daily.    Marland Kitchen amLODipine (NORVASC) 5 MG tablet Take 1 tablet (5 mg total) by mouth daily. 90 tablet 3  . aspirin EC 81 MG tablet Take 81 mg by mouth daily.    . carvedilol (COREG) 6.25 MG tablet TAKE ONE TABLET BY MOUTH TWICE DAILY WITH A MEAL 180 tablet 1  . diazepam (VALIUM) 10 MG tablet Take 10 mg by mouth at bedtime as needed for anxiety or sleep.     . fish oil-omega-3 fatty acids 1000 MG capsule Take 1 g by mouth 3 (three) times daily.     Marland Kitchen losartan (COZAAR) 50 MG tablet Take 1 tablet (50 mg total) by mouth daily. 90 tablet 3  . meclizine (ANTIVERT) 25 MG tablet Take 25 mg by mouth as needed for dizziness (FOR INNER EAR).     . metoprolol tartrate (LOPRESSOR) 25 MG tablet Take 1 tablet (25 mg total) by mouth 2 (two) times daily. 60 tablet 6  . Multiple Vitamin (MULTIVITAMIN WITH MINERALS) TABS tablet Take 1 tablet by mouth daily.    Marland Kitchen omeprazole (PRILOSEC) 20 MG capsule Take 20 mg by mouth daily.    .  pravastatin (PRAVACHOL) 80 MG tablet TAKE ONE TABLET BY MOUTH ONCE DAILY 90 tablet 3  . warfarin (COUMADIN) 4 MG tablet Take 1 tablet by mouth daily. Daily     No current facility-administered medications for this visit.     Allergies  Allergen Reactions  . Codeine Itching, Nausea Only and Rash  . Niaspan [Niacin Er] Other (See Comments)    MAKES SKIN FLUSH EXCESSIVELY    Past Medical History:  Diagnosis Date  . Allergic rhinitis   . CAD (coronary artery disease)    DES Cypher x 2 RCA 2007; Repeat LHC 06/2014 angiographically minimal CAD with widely patent RCA stents EF 60-65%   . Essential hypertension   . GERD (gastroesophageal reflux disease)   . History of DVT (deep vein thrombosis) July 2014  . History of pulmonary embolus (PE) July 2014  . Hyperlipidemia   . Myocardial infarction ([place]) 2007  . On continuous oral anticoagulation   . Vertigo     Social History   Social History  . Marital status: Married    Spouse name: N/A  . Number of children: 2  . Years of education: N/A   Occupational History  . disablity     CAD  Social History Main Topics  . Smoking status: Former Smoker    Packs/day: 0.10    Years: 13.00    Types: Cigarettes    Quit date: 05/27/1977  . Smokeless tobacco: Never Used  . Alcohol use No  . Drug use: No  . Sexual activity: Yes    Partners: Female    Birth control/ protection: None     Comment: spouse   Other Topics Concern  . Not on file   Social History Narrative  . No narrative on file     Family History  Problem Relation Age of Onset  . Colon cancer Mother 40  . Colon cancer Paternal Uncle 9  . Colon cancer Brother 57    Rectal cancer  . Colon cancer Paternal Uncle 54  . Throat cancer Brother 96  . Prostate cancer Brother   . Pulmonary embolism Brother     Cancer     Review of Systems: General: negative for chills, fever, night sweats or weight changes.  Cardiovascular: negative for chest pain, dyspnea on exertion,  edema, orthopnea, palpitations, paroxysmal nocturnal dyspnea or shortness of breath Dermatological: negative for rash Respiratory: negative for cough or wheezing Urologic: negative for hematuria Abdominal: negative for nausea, vomiting, diarrhea, bright red blood per rectum, melena, or hematemesis Neurologic: negative for visual changes, syncope, or dizziness All other systems reviewed and are otherwise negative except as noted above.    Blood pressure 139/79, pulse 76, height 5' 9.5" (1.765 m), weight 239 lb 12.8 oz (108.8 kg).  General appearance: alert, cooperative, no distress and mildly obese Neck: no carotid bruit and no JVD Lungs: clear to auscultation bilaterally Heart: regular rate and rhythm Extremities: extremities normal, atraumatic, no cyanosis or edema Neurologic: Grossly normal  EKG NSR, PAC, 1st degree AVB  ASSESSMENT AND PLAN:   CAD-s/p PCI  RCA PCI in 2007; last cath 06/2014 w/ minimal CAD w/ widely patent RCA stents No angina  History of pulmonary embolus (PE) Lt LL PE by [facility]A July 2014- not seen on CT in 2016  Chronic anticoagulation for PE, DVT Coumadin followed by Dr Gerarda Fraction  Essential hypertension Controlled  Dyslipidemia On statin Rx- followed by PCP   PLAN  He is on both Coreg 3.125 mg BID and Lopressor 12.5 mg BID- will stop Coreg and increase Metoprolol to 25 mg BID.   Discussed with Dr [name] in the office today- OK to hold Coumadin as needed pre-op without crossover, resume as soon as safe post op.   [name] PA-C 08/15/2016 9:12 AM

## 2016-08-15 NOTE — Assessment & Plan Note (Signed)
Controlled.  

## 2016-08-15 NOTE — Assessment & Plan Note (Signed)
RCA PCI in 2007; last cath 06/2014 w/ minimal CAD w/ widely patent RCA stents No angina

## 2016-08-15 NOTE — Patient Instructions (Signed)
Medication Instructions:   STOP your coreg (carvedilol) INCREASE metoprolol to 25mg  TWICE A DAY  Labwork: none   Testing/Procedures: none   Follow-Up: as scheduled with Dr. [name] in June   If you need a refill on your cardiac medications before your next appointment, please call your pharmacy.

## 2016-08-15 NOTE — Assessment & Plan Note (Signed)
On statin Rx- followed by PCP 

## 2016-08-16 ENCOUNTER — Other Ambulatory Visit: Payer: Self-pay | Admitting: Urology

## 2016-08-16 NOTE — Telephone Encounter (Signed)
Okay to interrupt his Coumadin for his urologic procedure.

## 2016-08-21 NOTE — Telephone Encounter (Signed)
Routed to number provided via EPIC. 

## 2016-08-24 DIAGNOSIS — Z7901 Long term (current) use of anticoagulants: Secondary | ICD-10-CM | POA: Diagnosis not present

## 2016-09-07 ENCOUNTER — Encounter (HOSPITAL_BASED_OUTPATIENT_CLINIC_OR_DEPARTMENT_OTHER): Payer: Self-pay | Admitting: *Deleted

## 2016-09-08 ENCOUNTER — Encounter (HOSPITAL_BASED_OUTPATIENT_CLINIC_OR_DEPARTMENT_OTHER): Payer: Self-pay | Admitting: *Deleted

## 2016-09-08 NOTE — Progress Notes (Addendum)
NPO AFTER MN.  ARRIVE AT 0600.  NEEDS ISTAT 8. GETTING PT/INR DONE AT [facility] , Monday 09-18-2016.  CURRENT EKG IN CHART AND EPIC.  WILL TAKE PRILOSEC, COZAAR, NORVASC, AND METOPROLOL  W/ SIPS OF WATER DOS. PT AWARE TO STOP COUMADIN 5 DAYS PRIOR TO DOS .

## 2016-09-18 NOTE — H&P (Signed)
CC: I am having trouble with my erections.  HPI: Steven Oneal is a 71 year-old male patient who was referred by Dr. Redmond School, MD who is here for erectile dysfunction.  He first stated noticing pain on approximately 11/30/2015. His symptoms did not begin gradually. His symptoms did begin suddenly. His symptoms have been worse over the last year.   He does have difficulties achieving an erection. He does have problems maintaining his erections. His erections are not straight. He has tried Cialis. It did not work.   Steven Oneal is a 71 yo male who is sent by Dr. Gerarda Fraction for refractory ED. He has a several year history of ED that has been progressive. He last had a functional erection in 8/17. His wife is not able and they haven't had sex in 41 years. He has tried Cialis but it no longer works. He can't get a partial erection now. He has no pain or curvature. He has had the AM erection in years. He is on testosterone therapy for the last 2 months but it hasn't helped the ED. He has mild nocturia but no other voiding complaints. He has no prior GU history.      ALLERGIES: Codeine Derivatives    MEDICATIONS: Coumadin 4 mg tablet 1 tablet PO Daily  Alpha Lipoic Acid 200 mg capsule 1 capsule PO Daily  Amlodipine Besylate 5 mg tablet Oral  Coreg 1 PO Daily  Cozaar 1 PO Daily  Ecotrin 81 mg tablet, delayed release Oral  Fish Oil-Omega 3 Fatty Acid 1000Mg  1 PO Daily  Isosorbide Mononitrate 30 mg tablet, extended release 24 hr Oral  Meclizine Hcl 25 mg tablet 1 tablet PO Daily  Metoprolol Tartrate 25 mg tablet Oral  Multiple Vitamin 1 PO Daily  Niaspan 1000 MG Oral Tablet Extended Release Oral  Nitroquick 0.6 mg tablet, sublingual Sublingual  Pravachol 80 mg tablet 1 tablet PO Daily  PriLOSEC OTC 20 MG Oral Tablet Delayed Release Oral  Simvastatin 40 mg tablet Oral  Valium 1 PO Daily     GU PSH: Epididymectomy - 2012      PSH Notes: Cath Stent Placement 2007   NON-GU PSH:  Cholecystectomy (laparoscopic) - 2014    GU PMH: None     PMH Notes:  1898-05-01 00:00:00 - Note: Normal Routine History And Physical Adult  2007 16:08:47 - Note: Acute Myocardial Infarction  blood clot in leg  blood clot in lung  stomach ulcers   NON-GU PMH: Anxiety, Anxiety - 2014 Heartburn Hypercholesterolemia Hypertension    FAMILY HISTORY: Cancer - Brother Death In The Family Father - Runs In Family Death In The Family Mother - Runs In Family Family Health Status Number - Runs In Family Heart Attack - Father prostate cancer in father - Father   SOCIAL HISTORY: Marital Status: Married Current Smoking Status: Patient does not smoke anymore. Has not smoked since 06/30/1986.   Tobacco Use Assessment Completed: Used Tobacco in last 30 days? Has never drank.  Drinks 2 caffeinated drinks per day. Patient's occupation is/was disable.     Notes: Alcohol Use, Caffeine Use, Tobacco Use, Physical Disability:   REVIEW OF SYSTEMS:    GU Review Male:   Patient reports get up at night to urinate and erection problems. Patient denies frequent urination, hard to postpone urination, burning/ pain with urination, leakage of urine, stream starts and stops, trouble starting your stream, have to strain to urinate , and penile pain.  Gastrointestinal (Upper):   Patient denies nausea, vomiting,  and indigestion/ heartburn.  Gastrointestinal (Lower):   Patient denies diarrhea and constipation.  Constitutional:   Patient denies fever, night sweats, weight loss, and fatigue.  Skin:   Patient denies skin rash/ lesion and itching.  Eyes:   Patient denies blurred vision and double vision.  Ears/ Nose/ Throat:   Patient denies sore throat and sinus problems.  Hematologic/Lymphatic:   Patient denies swollen glands and easy bruising.  Cardiovascular:   Patient denies leg swelling and chest pains.  Respiratory:   Patient denies cough and shortness of breath.  Endocrine:   Patient denies excessive  thirst.  Musculoskeletal:   Patient denies back pain and joint pain.  Neurological:   Patient denies headaches and dizziness.  Psychologic:   Patient denies depression and anxiety.   VITAL SIGNS:      07/21/2016 01:29 PM  Weight 232 lb / 105.23 kg  Height 69 in / 175.26 cm  BP 109/69 mmHg  Pulse 80 /min  Temperature 98.2 F / 37 C  BMI 34.3 kg/m   GU PHYSICAL EXAMINATION:    Scrotum: No lesions. No edema. No cysts. No warts.  Epididymides: Right: no spermatocele, no masses, no cysts, no tenderness, no induration, no enlargement. Left: no spermatocele, no masses, no cysts, no tenderness, no induration, no enlargement.  Testes: No tenderness, no swelling, no enlargement left testes. No tenderness, no swelling, no enlargement right testes. Normal location left testes. Normal location right testes. No mass, no cyst, no varicocele, no hydrocele left testes. No mass, no cyst, no varicocele, no hydrocele right testes.  Urethral Meatus: Normal size. No lesion, no wart, no discharge, no polyp. Normal location.  Penis: Penis uncircumcised. No foreskin warts, no cracks. No dorsal peyronie's plaques, no left corporal peyronie's plaques, no right corporal peyronie's plaques, no scarring, no shaft warts. No balanitis, no meatal stenosis.    MULTI-SYSTEM PHYSICAL EXAMINATION:    Constitutional: Obese. No physical deformities. Normally developed. Good grooming.   Neck: Neck symmetrical, not swollen. Normal tracheal position.  Respiratory: No labored breathing, no use of accessory muscles. RRR without murmur  Cardiovascular: Normal temperature, RRR without murmur.   Lymphatic: No enlargement of neck, axillae, groin.  Skin: No paleness, no jaundice, no cyanosis. No lesion, no ulcer, no rash.  Neurologic / Psychiatric: Oriented to time, oriented to place, oriented to person. No depression, no anxiety, no agitation.  Gastrointestinal: Obese abdomen. No hernia. No mass, no tenderness, no rigidity.    Musculoskeletal: Normal gait and station of head and neck.     PAST DATA REVIEWED:  Source Of History:  Patient  Records Review:   Previous Doctor Records  Notes:                     Records from Dr. Gerarda Fraction reviewed.    PROCEDURES: None   ASSESSMENT:      ICD-10 Details  1 GU:   ED, arterial insufficiency - N52.01 He has severe ED that no longer responds to PDE5's plus he is on isosorbide. He is on warfarin as well. He could use a VED if careful or injections and I discussed both of those. I also discussed semirigid and inflatable prostheses. He is interested in a semirigid prosthesis which I think is appropriate in his situation. I reviewed the risks of bleeding, infection, penile or urethral injury, glanular deformity, chronic pain, erosion, thrombotic events and anesthetic complications. I will get him cleared by Dr. [name] and encouraged him to try to lose a few pounds.  PLAN:           Schedule Return Visit/Planned Activity: Next Available Appointment - Schedule Surgery

## 2016-09-18 NOTE — Anesthesia Preprocedure Evaluation (Signed)
Anesthesia Evaluation  Patient identified by MRN, date of birth, ID band Patient awake    Reviewed: Allergy & Precautions, H&P , NPO status , Patient's Chart, lab work & pertinent test results  Airway Mallampati: II  TM Distance: >3 FB     Dental  (+) Edentulous Upper, Partial Lower   Pulmonary former smoker,    breath sounds clear to auscultation       Cardiovascular hypertension, Pt. on medications + angina + CAD, + Past MI, + Cardiac Stents, + Peripheral Vascular Disease and + DVT  + dysrhythmias  Rhythm:Regular Rate:Normal     Neuro/Psych    GI/Hepatic GERD  Medicated and Controlled,  Endo/Other    Renal/GU      Musculoskeletal   Abdominal   Peds  Hematology   Anesthesia Other Findings   Reproductive/Obstetrics                             Anesthesia Physical  Anesthesia Plan  ASA: III  Anesthesia Plan: General   Post-op Pain Management:    Induction: Intravenous  Airway Management Planned: LMA  Additional Equipment:   Intra-op Plan:   Post-operative Plan: Extubation in OR  Informed Consent: I have reviewed the patients History and Physical, chart, labs and discussed the procedure including the risks, benefits and alternatives for the proposed anesthesia with the patient or authorized representative who has indicated his/her understanding and acceptance.   Dental advisory given  Plan Discussed with: CRNA  Anesthesia Plan Comments:         Anesthesia Quick Evaluation

## 2016-09-19 ENCOUNTER — Ambulatory Visit (HOSPITAL_BASED_OUTPATIENT_CLINIC_OR_DEPARTMENT_OTHER): Payer: Medicare Other | Admitting: Anesthesiology

## 2016-09-19 ENCOUNTER — Encounter (HOSPITAL_BASED_OUTPATIENT_CLINIC_OR_DEPARTMENT_OTHER)
Admission: RE | Disposition: A | Discharge status: Home / Self Care | Payer: Self-pay | Source: Ambulatory Visit | Attending: Urology

## 2016-09-19 ENCOUNTER — Encounter (HOSPITAL_BASED_OUTPATIENT_CLINIC_OR_DEPARTMENT_OTHER): Payer: Self-pay

## 2016-09-19 ENCOUNTER — Ambulatory Visit (HOSPITAL_BASED_OUTPATIENT_CLINIC_OR_DEPARTMENT_OTHER)
Admission: RE | Admit: 2016-09-19 | Discharge: 2016-09-19 | Disposition: A | Discharge status: Home / Self Care | Payer: Medicare Other | Source: Ambulatory Visit | Attending: Urology | Admitting: Urology

## 2016-09-19 DIAGNOSIS — Z86711 Personal history of pulmonary embolism: Secondary | ICD-10-CM | POA: Diagnosis not present

## 2016-09-19 DIAGNOSIS — I10 Essential (primary) hypertension: Secondary | ICD-10-CM | POA: Diagnosis not present

## 2016-09-19 DIAGNOSIS — Z7982 Long term (current) use of aspirin: Secondary | ICD-10-CM | POA: Insufficient documentation

## 2016-09-19 DIAGNOSIS — I252 Old myocardial infarction: Secondary | ICD-10-CM | POA: Diagnosis not present

## 2016-09-19 DIAGNOSIS — Z87891 Personal history of nicotine dependence: Secondary | ICD-10-CM | POA: Diagnosis not present

## 2016-09-19 DIAGNOSIS — Z7901 Long term (current) use of anticoagulants: Secondary | ICD-10-CM | POA: Insufficient documentation

## 2016-09-19 DIAGNOSIS — Z955 Presence of coronary angioplasty implant and graft: Secondary | ICD-10-CM | POA: Diagnosis not present

## 2016-09-19 DIAGNOSIS — Z86718 Personal history of other venous thrombosis and embolism: Secondary | ICD-10-CM | POA: Diagnosis not present

## 2016-09-19 DIAGNOSIS — Z79899 Other long term (current) drug therapy: Secondary | ICD-10-CM | POA: Insufficient documentation

## 2016-09-19 DIAGNOSIS — I2510 Atherosclerotic heart disease of native coronary artery without angina pectoris: Secondary | ICD-10-CM | POA: Insufficient documentation

## 2016-09-19 DIAGNOSIS — N5201 Erectile dysfunction due to arterial insufficiency: Secondary | ICD-10-CM | POA: Diagnosis not present

## 2016-09-19 DIAGNOSIS — K5730 Diverticulosis of large intestine without perforation or abscess without bleeding: Secondary | ICD-10-CM | POA: Diagnosis not present

## 2016-09-19 DIAGNOSIS — E785 Hyperlipidemia, unspecified: Secondary | ICD-10-CM | POA: Diagnosis not present

## 2016-09-19 DIAGNOSIS — K219 Gastro-esophageal reflux disease without esophagitis: Secondary | ICD-10-CM | POA: Insufficient documentation

## 2016-09-19 DIAGNOSIS — E7800 Pure hypercholesterolemia, unspecified: Secondary | ICD-10-CM | POA: Diagnosis not present

## 2016-09-19 HISTORY — DX: Atrial premature depolarization: I49.1

## 2016-09-19 HISTORY — DX: Nocturia: R35.1

## 2016-09-19 HISTORY — DX: Atrioventricular block, first degree: I44.0

## 2016-09-19 HISTORY — DX: Personal history of colonic polyps: Z86.010

## 2016-09-19 HISTORY — DX: Long term (current) use of anticoagulants: Z79.01

## 2016-09-19 HISTORY — DX: Presence of coronary angioplasty implant and graft: Z95.5

## 2016-09-19 HISTORY — DX: Personal history of adenomatous and serrated colon polyps: Z86.0101

## 2016-09-19 HISTORY — DX: Diaphragmatic hernia without obstruction or gangrene: K44.9

## 2016-09-19 HISTORY — PX: PENILE PROSTHESIS IMPLANT: SHX240

## 2016-09-19 HISTORY — DX: Presence of dental prosthetic device (complete) (partial): Z97.2

## 2016-09-19 HISTORY — DX: Hyperlipidemia, unspecified: E78.5

## 2016-09-19 HISTORY — DX: Male erectile dysfunction, unspecified: N52.9

## 2016-09-19 HISTORY — DX: Old myocardial infarction: I25.2

## 2016-09-19 LAB — POCT I-STAT, CHEM 8
BUN: 13 mg/dL (ref 6–20)
CHLORIDE: 102 mmol/L (ref 101–111)
CREATININE: 0.7 mg/dL (ref 0.61–1.24)
Calcium, Ion: 1.23 mmol/L (ref 1.15–1.40)
GLUCOSE: 153 mg/dL — AB (ref 65–99)
HCT: 42 % (ref 39.0–52.0)
Hemoglobin: 14.3 g/dL (ref 13.0–17.0)
POTASSIUM: 3.9 mmol/L (ref 3.5–5.1)
Sodium: 140 mmol/L (ref 135–145)
TCO2: 27 mmol/L (ref 0–100)

## 2016-09-19 LAB — PROTIME-INR
INR: 1.04
Prothrombin Time: 13.7 seconds (ref 11.4–15.2)

## 2016-09-19 SURGERY — INSERTION SEMI-RIGID PENILE
Anesthesia: General | Site: Penis

## 2016-09-19 MED ORDER — FENTANYL CITRATE (PF) 100 MCG/2ML IJ SOLN
INTRAMUSCULAR | Status: AC
  Filled 2016-09-19: qty 2

## 2016-09-19 MED ORDER — PROPOFOL 10 MG/ML IV BOLUS
INTRAVENOUS | Status: DC | PRN
  Administered 2016-09-19: 200 mg via INTRAVENOUS

## 2016-09-19 MED ORDER — LACTATED RINGERS IV SOLN
INTRAVENOUS | Status: DC
  Administered 2016-09-19 (×3): via INTRAVENOUS
  Filled 2016-09-19: qty 1000

## 2016-09-19 MED ORDER — OXYCODONE HCL 5 MG/5ML PO SOLN
5.0000 mg | Freq: Once | ORAL | Status: DC | PRN
  Filled 2016-09-19: qty 5

## 2016-09-19 MED ORDER — ACETAMINOPHEN 325 MG PO TABS
650.0000 mg | ORAL_TABLET | ORAL | Status: DC | PRN
  Filled 2016-09-19: qty 2

## 2016-09-19 MED ORDER — DEXTROSE 5 % IV SOLN
5.0000 mg/kg | INTRAVENOUS | Status: AC
  Administered 2016-09-19: 540 mg via INTRAVENOUS
  Filled 2016-09-19 (×2): qty 13.5

## 2016-09-19 MED ORDER — SODIUM CHLORIDE 0.9 % IV SOLN
250.0000 mL | INTRAVENOUS | Status: DC | PRN
  Filled 2016-09-19: qty 250

## 2016-09-19 MED ORDER — MEPERIDINE HCL 25 MG/ML IJ SOLN
6.2500 mg | INTRAMUSCULAR | Status: DC | PRN
  Filled 2016-09-19: qty 1

## 2016-09-19 MED ORDER — ONDANSETRON HCL 4 MG/2ML IJ SOLN
INTRAMUSCULAR | Status: AC
  Filled 2016-09-19: qty 2

## 2016-09-19 MED ORDER — ACETAMINOPHEN 650 MG RE SUPP
650.0000 mg | RECTAL | Status: DC | PRN
  Filled 2016-09-19: qty 1

## 2016-09-19 MED ORDER — TAMSULOSIN HCL 0.4 MG PO CAPS
ORAL_CAPSULE | ORAL | Status: AC
  Filled 2016-09-19: qty 1

## 2016-09-19 MED ORDER — MIDAZOLAM HCL 2 MG/2ML IJ SOLN
INTRAMUSCULAR | Status: AC
  Filled 2016-09-19: qty 2

## 2016-09-19 MED ORDER — LIDOCAINE HCL 1 % IJ SOLN
INTRAMUSCULAR | Status: DC | PRN
  Administered 2016-09-19: 10 mL

## 2016-09-19 MED ORDER — ONDANSETRON HCL 4 MG/2ML IJ SOLN
INTRAMUSCULAR | Status: DC | PRN
  Administered 2016-09-19: 4 mg via INTRAVENOUS

## 2016-09-19 MED ORDER — FENTANYL CITRATE (PF) 100 MCG/2ML IJ SOLN
INTRAMUSCULAR | Status: DC | PRN
  Administered 2016-09-19 (×2): 25 ug via INTRAVENOUS
  Administered 2016-09-19: 50 ug via INTRAVENOUS
  Administered 2016-09-19 (×4): 25 ug via INTRAVENOUS

## 2016-09-19 MED ORDER — OXYCODONE HCL 5 MG PO TABS
5.0000 mg | ORAL_TABLET | Freq: Once | ORAL | Status: DC | PRN
  Filled 2016-09-19: qty 1

## 2016-09-19 MED ORDER — OXYCODONE HCL 5 MG PO TABS
5.0000 mg | ORAL_TABLET | ORAL | Status: DC | PRN
  Filled 2016-09-19: qty 2

## 2016-09-19 MED ORDER — MIDAZOLAM HCL 5 MG/5ML IJ SOLN
INTRAMUSCULAR | Status: DC | PRN
  Administered 2016-09-19: 2 mg via INTRAVENOUS

## 2016-09-19 MED ORDER — DOCUSATE SODIUM 100 MG PO CAPS: 100.0000 mg | ORAL_CAPSULE | Freq: Two times a day (BID) | ORAL | 2 refills | Status: DC

## 2016-09-19 MED ORDER — MORPHINE SULFATE (PF) 2 MG/ML IV SOLN
2.0000 mg | INTRAVENOUS | Status: DC | PRN
  Filled 2016-09-19: qty 1

## 2016-09-19 MED ORDER — SULFAMETHOXAZOLE-TRIMETHOPRIM 800-160 MG PO TABS: 1.0000 | ORAL_TABLET | Freq: Two times a day (BID) | ORAL | 0 refills | Status: DC

## 2016-09-19 MED ORDER — LIDOCAINE HCL (CARDIAC) 20 MG/ML IV SOLN
INTRAVENOUS | Status: DC | PRN
  Administered 2016-09-19: 100 mg via INTRAVENOUS

## 2016-09-19 MED ORDER — METOCLOPRAMIDE HCL 5 MG/ML IJ SOLN
INTRAMUSCULAR | Status: AC
  Filled 2016-09-19: qty 2

## 2016-09-19 MED ORDER — PROPOFOL 10 MG/ML IV BOLUS
INTRAVENOUS | Status: AC
  Filled 2016-09-19: qty 20

## 2016-09-19 MED ORDER — EPHEDRINE SULFATE 50 MG/ML IJ SOLN
INTRAMUSCULAR | Status: DC | PRN
  Administered 2016-09-19 (×2): 10 mg via INTRAVENOUS

## 2016-09-19 MED ORDER — PROMETHAZINE HCL 25 MG/ML IJ SOLN
6.2500 mg | INTRAMUSCULAR | Status: DC | PRN
  Filled 2016-09-19: qty 1

## 2016-09-19 MED ORDER — SODIUM CHLORIDE 0.9% FLUSH
3.0000 mL | Freq: Two times a day (BID) | INTRAVENOUS | Status: DC
  Filled 2016-09-19: qty 3

## 2016-09-19 MED ORDER — OXYCODONE-ACETAMINOPHEN 5-325 MG PO TABS: 1.0000 | ORAL_TABLET | ORAL | 0 refills | Status: DC | PRN

## 2016-09-19 MED ORDER — SODIUM CHLORIDE 0.9% FLUSH
3.0000 mL | INTRAVENOUS | Status: DC | PRN
  Filled 2016-09-19: qty 3

## 2016-09-19 MED ORDER — TAMSULOSIN HCL 0.4 MG PO CAPS: 0.4000 mg | ORAL_CAPSULE | Freq: Every day | ORAL | 1 refills | Status: DC

## 2016-09-19 MED ORDER — SODIUM CHLORIDE 0.9 % IR SOLN
Status: DC | PRN
  Administered 2016-09-19: 500 mL

## 2016-09-19 MED ORDER — DEXAMETHASONE SODIUM PHOSPHATE 10 MG/ML IJ SOLN
INTRAMUSCULAR | Status: AC
  Filled 2016-09-19: qty 1

## 2016-09-19 MED ORDER — DEXAMETHASONE SODIUM PHOSPHATE 4 MG/ML IJ SOLN
INTRAMUSCULAR | Status: DC | PRN
  Administered 2016-09-19: 10 mg via INTRAVENOUS

## 2016-09-19 MED ORDER — METOCLOPRAMIDE HCL 5 MG/ML IJ SOLN
INTRAMUSCULAR | Status: DC | PRN
  Administered 2016-09-19: 10 mg via INTRAVENOUS

## 2016-09-19 MED ORDER — TAMSULOSIN HCL 0.4 MG PO CAPS
0.4000 mg | ORAL_CAPSULE | Freq: Every day | ORAL | Status: DC
  Administered 2016-09-19: 0.4 mg via ORAL
  Filled 2016-09-19: qty 1

## 2016-09-19 MED ORDER — FENTANYL CITRATE (PF) 100 MCG/2ML IJ SOLN
25.0000 ug | INTRAMUSCULAR | Status: DC | PRN
  Filled 2016-09-19: qty 1

## 2016-09-19 MED ORDER — VANCOMYCIN HCL IN DEXTROSE 1-5 GM/200ML-% IV SOLN
1000.0000 mg | INTRAVENOUS | Status: AC
  Administered 2016-09-19: 1000 mg via INTRAVENOUS
  Filled 2016-09-19 (×2): qty 200

## 2016-09-19 MED ORDER — EPHEDRINE 5 MG/ML INJ
INTRAVENOUS | Status: AC
  Filled 2016-09-19: qty 10

## 2016-09-19 SURGICAL SUPPLY — 55 items
ADH SKN CLS APL DERMABOND .7 (GAUZE/BANDAGES/DRESSINGS) ×1
APL SKNCLS STERI-STRIP NONHPOA (GAUZE/BANDAGES/DRESSINGS)
APPLED MEDICAL ×1 IMPLANT
BAG URINE DRAINAGE (UROLOGICAL SUPPLIES) ×2 IMPLANT
BANDAGE COBAN STERILE 2 (GAUZE/BANDAGES/DRESSINGS) IMPLANT
BENZOIN TINCTURE PRP APPL 2/3 (GAUZE/BANDAGES/DRESSINGS) IMPLANT
BLADE HEX COATED 2.75 (ELECTRODE) ×2 IMPLANT
BLADE SURG 15 STRL LF DISP TIS (BLADE) ×2 IMPLANT
BLADE SURG 15 STRL SS (BLADE) ×4
BNDG GAUZE ELAST 4 BULKY (GAUZE/BANDAGES/DRESSINGS) ×2 IMPLANT
CANISTER SUCT 3000ML PPV (MISCELLANEOUS) ×2 IMPLANT
CATH FOLEY 2WAY SLVR  5CC 16FR (CATHETERS) ×1
CATH FOLEY 2WAY SLVR 5CC 16FR (CATHETERS) ×1 IMPLANT
CATH URET 5FR 28IN OPEN ENDED (CATHETERS) IMPLANT
COVER BACK TABLE 60X90IN (DRAPES) ×2 IMPLANT
COVER MAYO STAND STRL (DRAPES) ×4 IMPLANT
DERMABOND ADVANCED (GAUZE/BANDAGES/DRESSINGS) ×1
DERMABOND ADVANCED .7 DNX12 (GAUZE/BANDAGES/DRESSINGS) IMPLANT
DISSECTOR ROUND CHERRY 3/8 STR (MISCELLANEOUS) ×1 IMPLANT
DRAPE INCISE IOBAN 66X45 STRL (DRAPES) ×1 IMPLANT
DRAPE LAPAROTOMY T 102X78X121 (DRAPES) ×1 IMPLANT
DRAPE LAPAROTOMY TRNSV 102X78 (DRAPE) ×1 IMPLANT
DRSG TEGADERM 4X4.75 (GAUZE/BANDAGES/DRESSINGS) ×2 IMPLANT
ELECT REM PT RETURN 9FT ADLT (ELECTROSURGICAL) ×2
ELECTRODE REM PT RTRN 9FT ADLT (ELECTROSURGICAL) ×1 IMPLANT
GAUZE SPONGE 4X4 12PLY STRL (GAUZE/BANDAGES/DRESSINGS) IMPLANT
GAUZE SPONGE 4X4 8PLY STR LF (GAUZE/BANDAGES/DRESSINGS) ×1 IMPLANT
GLOVE SURG SS PI 8.0 STRL IVOR (GLOVE) IMPLANT
GOWN STRL REUS W/TWL XL LVL3 (GOWN DISPOSABLE) ×2 IMPLANT
NEEDLE HYPO 22GX1.5 SAFETY (NEEDLE) ×2 IMPLANT
NS IRRIG 500ML POUR BTL (IV SOLUTION) ×2 IMPLANT
PACK BASIN DAY SURGERY FS (CUSTOM PROCEDURE TRAY) ×2 IMPLANT
PENCIL BUTTON HOLSTER BLD 10FT (ELECTRODE) ×2 IMPLANT
PENILE PROSTHESIS 11CM GENESIS (Erectile Restoration) ×2 IMPLANT
PLUG CATH AND CAP STER (CATHETERS) ×2 IMPLANT
PROSTHESIS PENILE 11CM GENESIS (Erectile Restoration) IMPLANT
RETRACTOR WILSON SYSTEM (INSTRUMENTS) ×1 IMPLANT
SPONGE LAP 4X18 X RAY DECT (DISPOSABLE) IMPLANT
STRIP CLOSURE SKIN 1/2X4 (GAUZE/BANDAGES/DRESSINGS) ×2 IMPLANT
SUPPORT SCROTAL LG STRP (MISCELLANEOUS) ×2 IMPLANT
SUT CHROMIC 3 0 SH 27 (SUTURE) ×4 IMPLANT
SUT MNCRL AB 4-0 PS2 18 (SUTURE) ×1 IMPLANT
SUT PDS AB 2-0 CT2 27 (SUTURE) ×12 IMPLANT
SUT VIC AB 2-0 UR6 27 (SUTURE) ×4 IMPLANT
SUT VIC AB 3-0 SH 27 (SUTURE) ×4
SUT VIC AB 3-0 SH 27X BRD (SUTURE) IMPLANT
SYR 20CC LL (SYRINGE) ×2 IMPLANT
SYR 50ML LL SCALE MARK (SYRINGE) ×4 IMPLANT
SYR BULB IRRIGATION 50ML (SYRINGE) ×2 IMPLANT
SYR CONTROL 10ML LL (SYRINGE) ×2 IMPLANT
TOWEL OR 17X24 6PK STRL BLUE (TOWEL DISPOSABLE) ×2 IMPLANT
TRAY DSU PREP LF (CUSTOM PROCEDURE TRAY) ×2 IMPLANT
TUBE CONNECTING 12X1/4 (SUCTIONS) ×2 IMPLANT
WATER STERILE IRR 500ML POUR (IV SOLUTION) ×2 IMPLANT
YANKAUER SUCT BULB TIP NO VENT (SUCTIONS) ×2 IMPLANT

## 2016-09-19 NOTE — Anesthesia Procedure Notes (Addendum)
Procedure Name: LMA Insertion Date/Time: 09/19/2016 7:34 AM Performed by: Justice Rocher Pre-anesthesia Checklist: Patient identified, Emergency Drugs available, Suction available and Patient being monitored Patient Re-evaluated:Patient Re-evaluated prior to inductionOxygen Delivery Method: Circle system utilized Preoxygenation: Pre-oxygenation with 100% oxygen Intubation Type: IV induction Ventilation: Mask ventilation without difficulty LMA: LMA inserted LMA Size: 5.0 Number of attempts: 1 Airway Equipment and Method: Bite block Placement Confirmation: positive ETCO2 and breath sounds checked- equal and bilateral Tube secured with: Tape Dental Injury: Teeth and Oropharynx as per pre-operative assessment  Comments: Noted dry cracked lips, left lower corner lip small open dry cracked. Lube applied

## 2016-09-19 NOTE — Transfer of Care (Signed)
Immediate Anesthesia Transfer of Care Note  Patient: Steven Oneal  Procedure(s) Performed: Procedure(s) (LRB): INSERTION SEMI-RIGID PENILE PROSTHESIS COLOPLAST (N/A)  Patient Location: PACU  Anesthesia Type: General  Level of Consciousness: awake, sedated, patient cooperative and responds to stimulation  Airway & Oxygen Therapy: Patient Spontanous Breathing and Patient connected to [place] O2  Post-op Assessment: Report given to PACU RN, Post -op Vital signs reviewed and stable and Patient moving all extremities  Post vital signs: Reviewed and stable  Complications: No apparent anesthesia complications

## 2016-09-19 NOTE — Discharge Instructions (Addendum)
Call your surgeon if you experience:   1.  Fever over 101.0. 2.  Inability to urinate. 3.  Nausea and/or vomiting. 4.  Extreme swelling or bruising at the surgical site. 5.  Continued bleeding from the incision. 6.  Increased pain, redness or drainage from the incision. 7.  Problems related to your pain medication. 8.  Any problems and/or concerns Post Anesthesia Home Care Instructions  Activity: Get plenty of rest for the remainder of the day. A responsible individual must stay with you for 24 hours following the procedure.  For the next 24 hours, DO NOT: -Drive a car -Paediatric nurse -Drink alcoholic beverages -Take any medication unless instructed by your physician -Make any legal decisions or sign important papers.  Meals: Start with liquid foods such as gelatin or soup. Progress to regular foods as tolerated. Avoid greasy, spicy, heavy foods. If nausea and/or vomiting occur, drink only clear liquids until the nausea and/or vomiting subsides. Call your physician if vomiting continues.  Special Instructions/Symptoms: Your throat may feel dry or sore from the anesthesia or the breathing tube placed in your throat during surgery. If this causes discomfort, gargle with warm salt water. The discomfort should disappear within 24 hours.  If you had a scopolamine patch placed behind your ear for the management of post- operative nausea and/or vomiting:  1. The medication in the patch is effective for 72 hours, after which it should be removed.  Wrap patch in a tissue and discard in the trash. Wash hands thoroughly with soap and water. 2. You may remove the patch earlier than 72 hours if you experience unpleasant side effects which may include dry mouth, dizziness or visual disturbances. 3. Avoid touching the patch. Wash your hands with soap and water after contact with the patch.   Penile Prosthesis Implantation Penile prosthesis implantation is a procedure to put a device that treats  erectile dysfunction into the penis. There are two main types of devices that can be put in during the procedure: malleable penile implants and inflatable penile implants. Malleable penile implant  A malleable penile implant, also called a non-hydraulic or semi-rigid implant, consists of two silicone rubber rods. The rods provide some rigidity. They are also flexible, so the penis can both curve downward in its normal position and become straight for sexual intercourse. Inflatable penile implant   An inflatable penile implant, also called a hydraulic implant, consists of cylinders, a pump, and a reservoir. The cylinders can be inflated with a fluid that helps to create an erection, and they can be deflated after intercourse. There are several types of inflatable implants. Tell a health care provider about:  Any allergies you have.  All medicines you are taking, including vitamins, herbs, eye drops, creams, and over-the-counter medicines.  Any problems you or family members have had with anesthetic medicines.  Any blood disorders you have.  Any surgeries you have had.  Any medical conditions you have. What are the risks? Generally, this is a safe procedure. However, problems may occur, including:  Infection in the penis. If this happens, the implant may need to be removed.  Bleeding.  Allergic reaction to medicines.  Damage to other structures or organs, such as the tube that drains urine from the body (urethra).  Not enough blood reaching the penis. This is rare. If this happens, the implant will need to be removed. What happens before the procedure? Medicines   Ask your health care provider about:  Changing or stopping your regular medicines.  This is especially important if you are taking diabetes medicines or blood thinners.  Taking medicines such as aspirin and ibuprofen. These medicines can thin your blood. Do not take these medicines before your procedure if your health  care provider instructs you not to. Staying hydrated  Follow instructions from your health care provider about hydration, which may include:  Up to 2 hours before the procedure - you may continue to drink clear liquids, such as water, clear fruit juice, black coffee, and plain tea. Eating and drinking restrictions  Follow instructions from your health care provider about eating and drinking, which may include:  8 hours before the procedure - stop eating heavy meals or foods such as meat, fried foods, or fatty foods.  6 hours before the procedure - stop eating light meals or foods, such as toast or cereal.  6 hours before the procedure - stop drinking milk or drinks that contain milk.  2 hours before the procedure - stop drinking clear liquids. General instructions   You may be asked to shower with a germ-killing soap.  Plan to have someone take you home from the hospital or clinic.  If you will be going home right after the procedure, plan to have someone with you for 24 hours. What happens during the procedure?  To lower your risk of infection:  Your health care team will wash or sanitize their hands.  Hair may be removed from the surgical area.  Your skin will be washed with soap.  You may be given antibiotic medicine.  An IV tube will be inserted into one of your veins.  You will be given one or more of the following:  A medicine to make you fall asleep (general anesthetic).  A medicine that is injected into your spine to numb the area below and slightly above the injection site (spinal anesthetic).  A flexible tube (catheter) may be inserted into your urethra and bladder. The catheter drains urine during the procedure and helps your surgeon easily locate your urethra.  A small incision will be made in your scrotum or in your penis, just below the head of your penis.  The cylinders of the prosthesis will be put into tissue on each side of your penis.  If you will  have an inflatable penile implant:  Incisions will be made in your abdomen and in your scrotum. These incisions will be used to insert the pump and the reservoir.  The cylinders, reservoir, and pump will be joined by tubes and tested.  Your incision(s) will be closed with dissolvable stitches (sutures).  A bandage (dressing) will be applied to your incision(s).  You may be fitted with a device similar to a jock strap or underwear with a supportive pouch (scrotal support) to relieve pressure on the incision area. The procedure may vary among health care providers and hospitals. What happens after the procedure?  Your blood pressure, heart rate, breathing rate, and blood oxygen level will be monitored until the medicines you were given have worn off.  If you have a catheter in place, it may stay in place for the day after the procedure.  You may be given antibiotics or pain medicines as needed.  You may need to follow a clear liquid diet for the first 24 hours after the procedure.  You may be encouraged to sit up and walk around.  A towel roll or an ice pack may be placed under your scrotum to help reduce swelling.  Do not  drive for 2 weeks. Summary  Penile prosthesis implantation is a procedure to put a device that treats erectile dysfunction into the penis.  There are two main types of devices that can be put in during the procedure: malleable penile implants and inflatable penile implants.  After the procedure, you may be fitted with a device similar to a jock strap or underwear with a supportive pouch (scrotal support) to relieve pressure on the incision area.  Please call if you have fever >101.5, pain not controlled by the pain med, difficulty urinating or drainage from the incision.   You may resume your warfarin tomorrow.   Follow up at the warfarin clinic as instructed by the physician who managed the warfarin.   This information is not intended to replace advice given  to you by your health care provider. Make sure you discuss any questions you have with your health care provider. Document Released: 07/25/2007 Document Revised: 01/28/2016 Document Reviewed: 01/28/2016 Elsevier Interactive Patient Education  2017 [employer].

## 2016-09-19 NOTE — Brief Op Note (Signed)
09/19/2016  8:57 AM  PATIENT:  Silvano Bilis  71 y.o. male  PRE-OPERATIVE DIAGNOSIS:  VASCULAR ERECTILE DYSFUNCTION  POST-OPERATIVE DIAGNOSIS:  VASCULAR ERECTILE DYSFUNCTION  PROCEDURE:  Procedure(s): INSERTION SEMI-RIGID PENILE PROSTHESIS COLOPLAST (N/A)  SURGEON:  Surgeon(s) and Role:    * Irine Seal, MD - Primary  PHYSICIAN ASSISTANT:   ASSISTANTS: none   ANESTHESIA:   local and general  EBL:  Total I/O In: 100 [I.V.:100] Out: -   BLOOD ADMINISTERED:none  DRAINS: None   LOCAL MEDICATIONS USED:  LIDOCAINE  and Amount: 10 ml 1% plain  SPECIMEN:  No Specimen  DISPOSITION OF SPECIMEN:  N/A  COUNTS:  YES  TOURNIQUET:  * No tourniquets in log *  DICTATION: .Other Dictation: Dictation Number [id number]  PLAN OF CARE: Discharge to home after PACU  PATIENT DISPOSITION:  PACU - hemodynamically stable.   Delay start of Pharmacological VTE agent (>24hrs) due to surgical blood loss or risk of bleeding: yes

## 2016-09-19 NOTE — Interval H&P Note (Signed)
History and Physical Interval Note:  09/19/2016 6:59 AM  Steven Oneal  has presented today for surgery, with the diagnosis of VASCULAR ERECTILE DYSFUNCTION  The various methods of treatment have been discussed with the patient and family. After consideration of risks, benefits and other options for treatment, the patient has consented to  Procedure(s): INSERTION SEMI-RIGID PENILE PROSTHESIS COLOPLAST (N/A) as a surgical intervention .  The patient's history has been reviewed, patient examined, no change in status, stable for surgery.  I have reviewed the patient's chart and labs.  Questions were answered to the patient's satisfaction.     [name] J

## 2016-09-20 ENCOUNTER — Other Ambulatory Visit (HOSPITAL_COMMUNITY)
Admission: RE | Admit: 2016-09-20 | Discharge: 2016-09-20 | Disposition: A | Discharge status: Home / Self Care | Payer: Medicare Other | Source: Ambulatory Visit | Attending: Urology | Admitting: Urology

## 2016-09-20 ENCOUNTER — Encounter (HOSPITAL_BASED_OUTPATIENT_CLINIC_OR_DEPARTMENT_OTHER): Payer: Self-pay | Admitting: Urology

## 2016-09-20 DIAGNOSIS — Z86718 Personal history of other venous thrombosis and embolism: Secondary | ICD-10-CM | POA: Diagnosis not present

## 2016-09-20 DIAGNOSIS — Z7901 Long term (current) use of anticoagulants: Secondary | ICD-10-CM | POA: Diagnosis not present

## 2016-09-20 DIAGNOSIS — Z86711 Personal history of pulmonary embolism: Secondary | ICD-10-CM | POA: Insufficient documentation

## 2016-09-20 LAB — PROTIME-INR
INR: 1.03
PROTHROMBIN TIME: 13.5 s (ref 11.4–15.2)

## 2016-09-20 NOTE — Op Note (Signed)
NAME:  Steven Oneal, Steven Oneal NO.:  MEDICAL RECORD NO.:  [id number]  LOCATION:                                 FACILITY:  PHYSICIAN:  Marshall Cork. Jeffie Pollock, M.D.    DATE OF BIRTH:  03-Aug-1945  DATE OF PROCEDURE:  09/19/2016 DATE OF DISCHARGE:                              OPERATIVE REPORT   Patient of Dr. Irine Seal.  PROCEDURE:  Insertion of Coloplast semi-rigid penile prosthesis.  PREOPERATIVE DIAGNOSIS:  Vasculogenic erectile dysfunction.  POSTOPERATIVE DIAGNOSIS:  Vasculogenic erectile dysfunction.  SURGEON:  Marshall Cork. Jeffie Pollock, MD.  ANESTHESIA:  General.  SPECIMEN:  None.  DRAINS:  None.  BLOOD LOSS:  20 mL.  COMPLICATIONS:  None.  INDICATIONS:  Mr. Siemon is a 71 year old white male with vascular erectile dysfunction.  He has failed conservative therapy and has elected a penile prosthesis.  He is on Coumadin and that has been held per routine.  FINDINGS AND PROCEDURE:  He was taken to the operating room, where he was given gentamicin and vancomycin per pharmacy dosing.  A general anesthetic was induced.  He was placed in the supine position.  His genitalia and mons were clipped.  He was then prepped with a 5-minute Betadine scrub, followed by Betadine prep and then draped in the usual sterile fashion.  A 16-French Foley catheter was inserted and the bladder was drained. The catheter was left in place.  A penoscrotal incision line was marked and incised with a knife.  This was then carried down through the underlying dartos with the Bovie.  The Wilson retractor was then used to provide exposure.  The urethra was palpated in the midline.  The right corpus was exposed using the scissors and Bovie.  The left corpus was exposed.  The 2-0 Vicryl stay sutures were placed in the corpora approximately 1- 1.5 cm lateral to the urethra on each side.  The right corporotomy was then performed using the Bovie, then extended with the scissors.  The Furlow  inserter was used to dilate proximally and distally, followed by an 11 mm Mentor dilator.  Dilation proceeded easily with great care being taken to avoid urethral perforation.  Once the right corpus had been dilated, the left corporotomy was then created.  Each corporotomy was approximately 3 cm in length.  The left corpus was dilated in a similar fashion once again with great care being taken to avoid urethral injury.  Once both corporotomy and dilation had been performed, the corpora were irrigated with antibiotic solution.  The Furlow inserter and measuring tool were then placed, one in each of the proximal corporotomies to ensure that no crossover had occurred.  The corpora were then measured with a 7 cm distal and 13 cm proximal measurement on both sides.  It was felt that a 20 cm, 11 mm Coloplast semi-rigid prosthesis was appropriate.  The prosthesis was then open and prepared with irrigation with antibiotic solution.  The cylinders were then trimmed to 20 cm and Zero Tip distal tip caps were placed.  The left cylinder was then placed after additional irrigation with the proximal limb placed initially followed by the distal limb.  The prosthesis was  seated and had good distal extension and no buckling. The right cylinder was then placed in an additional fashion with a similar appearance.  The corporotomies were then closed using a running 2-0 Vicryl suture. The wound was irrigated.  Hemostasis was assured.  The Dartos was closed with a running 3-0 Vicryl suture, followed by a few more superficial interrupted.  The skin was closed using a running intracuticular 4-0 Monocryl.  The wound was then cleaned, dried, and sealed with Dermabond.  After the Dermabond dried, a dressing of 4x4s, fluff, Kerlix, and athletic supporter was placed.  The Foley catheter had been removed at the end of the procedure as well.  The patient's anesthetic was then reversed.  He was moved to  the recovery room in stable condition.  There were no complications.     Marshall Cork. Jeffie Pollock, M.D.     JJW/MEDQ  D:  09/19/2016  T:  09/19/2016  Job:  [id number]

## 2016-09-20 NOTE — Anesthesia Postprocedure Evaluation (Signed)
Anesthesia Post Note  Patient: Steven Oneal  Procedure(s) Performed: Procedure(s) (LRB): INSERTION SEMI-RIGID PENILE PROSTHESIS COLOPLAST (N/A)  Patient location during evaluation: PACU Anesthesia Type: General Level of consciousness: sedated and patient cooperative Pain management: pain level controlled Vital Signs Assessment: post-procedure vital signs reviewed and stable Respiratory status: spontaneous breathing Cardiovascular status: stable Anesthetic complications: no       Last Vitals:  Vitals:   09/19/16 1000 09/19/16 1200  BP: 119/66 116/61  Pulse: 81 88  Resp: 16 16  Temp:  36.7 C    Last Pain:  Vitals:   09/19/16 1300  TempSrc:   PainSc: 2                  Nolon Nations

## 2016-09-29 ENCOUNTER — Ambulatory Visit (INDEPENDENT_AMBULATORY_CARE_PROVIDER_SITE_OTHER): Payer: Self-pay | Admitting: Urology

## 2016-09-29 DIAGNOSIS — N5201 Erectile dysfunction due to arterial insufficiency: Secondary | ICD-10-CM

## 2016-10-13 DIAGNOSIS — Z7901 Long term (current) use of anticoagulants: Secondary | ICD-10-CM | POA: Diagnosis not present

## 2016-10-20 ENCOUNTER — Encounter: Payer: Self-pay | Admitting: Cardiovascular Disease

## 2016-10-20 ENCOUNTER — Ambulatory Visit (INDEPENDENT_AMBULATORY_CARE_PROVIDER_SITE_OTHER): Payer: Medicare Other | Admitting: Cardiovascular Disease

## 2016-10-20 DIAGNOSIS — E785 Hyperlipidemia, unspecified: Secondary | ICD-10-CM | POA: Diagnosis not present

## 2016-10-20 DIAGNOSIS — Z9861 Coronary angioplasty status: Secondary | ICD-10-CM

## 2016-10-20 DIAGNOSIS — I10 Essential (primary) hypertension: Secondary | ICD-10-CM | POA: Diagnosis not present

## 2016-10-20 DIAGNOSIS — I2510 Atherosclerotic heart disease of native coronary artery without angina pectoris: Secondary | ICD-10-CM

## 2016-10-20 DIAGNOSIS — Z7901 Long term (current) use of anticoagulants: Secondary | ICD-10-CM | POA: Diagnosis not present

## 2016-10-20 NOTE — Assessment & Plan Note (Signed)
History of essential hypertension blood pressure measured today at 116/73. He is on losartan, Amlodipine and metoprolol. Continue current meds at current dosing,

## 2016-10-20 NOTE — Assessment & Plan Note (Signed)
Chronic Coumadin anti-coagulation because of prior pulmonary embolus

## 2016-10-20 NOTE — Assessment & Plan Note (Signed)
History of dyslipidemia on statin therapy followed by his PCP 

## 2016-10-20 NOTE — Patient Instructions (Signed)

## 2016-10-20 NOTE — Progress Notes (Signed)
10/20/2016 Steven Oneal   10-Mar-1946  [id number]  Primary Physician Redmond School, MD Primary Cardiologist: Lorretta Harp MD Steven Oneal, [place]  HPI:   71y/o male who has I last saw in the office 10/19/15. He has a history of CAD status post PCI and stenting of his RCA by Dr [name] Feb 2007 in the setting of an MI. He was restudied in June 2008, and had patent stents in his proximal RCA and noncritical disease in his proximal LAD with normal LV function. He has not had a Myoview since and has done well from a cardiac standpoint. He did have a PE/DVT in July 2014 and is on chronic Coumadin. He recently saw [name] after he had palpitations and chest pain Thanksgiving night. The pt says his heart was racing, he was sweaty, and he had pain across his chest. He told me "thought I was having a heart attack". [name] increased his Coreg and the pt stopped his Niaspan suspecting this had something to do with his symptoms, he had been on this since 2008. Since he stopped the Niaspan he has done well. He denies any further chest pain or palpations. His Holter showed PACs, PVCs, and sinus arrythmia with transient bradycardia and sinus pauses up to 2.7 seconds. A Myoview stress test performed 05/08/13 showed inferobasal scar with mild inferolateral hypokinesis unchanged from prior functional studies. Since I saw him last he has remained asymptomatic. He had a uncomplicated penile Implant performed by Dr. Jeffie Pollock recently.   Current Outpatient Prescriptions  Medication Sig Dispense Refill  . Alpha Lipoic Acid 200 MG CAPS Take 1 capsule by mouth daily.    Marland Kitchen amLODipine (NORVASC) 5 MG tablet Take 1 tablet (5 mg total) by mouth daily. (Patient taking differently: Take 5 mg by mouth every morning. ) 90 tablet 3  . aspirin EC 81 MG tablet Take 81 mg by mouth daily.    . diazepam (VALIUM) 10 MG tablet Take 10 mg by mouth at bedtime as needed for anxiety or sleep.     . fish oil-omega-3 fatty acids  1000 MG capsule Take 1 g by mouth 3 (three) times daily.     Marland Kitchen losartan (COZAAR) 50 MG tablet Take 1 tablet (50 mg total) by mouth daily. (Patient taking differently: Take 50 mg by mouth every morning. ) 90 tablet 3  . meclizine (ANTIVERT) 25 MG tablet Take 25 mg by mouth as needed for dizziness (FOR INNER EAR).     . metoprolol tartrate (LOPRESSOR) 25 MG tablet Take 1 tablet (25 mg total) by mouth 2 (two) times daily. 60 tablet 6  . Multiple Vitamin (MULTIVITAMIN WITH MINERALS) TABS tablet Take 1 tablet by mouth daily.    Marland Kitchen omeprazole (PRILOSEC) 20 MG capsule Take 20 mg by mouth every morning.     . pravastatin (PRAVACHOL) 80 MG tablet TAKE ONE TABLET BY MOUTH ONCE DAILY (Patient taking differently: TAKE ONE TABLET BY MOUTH ONCE DAILY-- takes in pm) 90 tablet 3  . tamsulosin (FLOMAX) 0.4 MG CAPS capsule Take 1 capsule (0.4 mg total) by mouth daily. 30 capsule 1  . warfarin (COUMADIN) 4 MG tablet Take 1 tablet by mouth daily. Daily     No current facility-administered medications for this visit.     Allergies  Allergen Reactions  . Codeine Hives, Itching and Nausea Only  . Niaspan [Niacin Er] Other (See Comments)    MAKES SKIN FLUSH EXCESSIVELY    Social History   Social  History  . Marital status: Married    Spouse name: N/A  . Number of children: 2  . Years of education: N/A   Occupational History  . disablity     CAD   Social History Main Topics  . Smoking status: Former Smoker    Packs/day: 0.10    Years: 13.00    Types: Cigarettes    Quit date: 05/27/1977  . Smokeless tobacco: Former Systems developer    Quit date: 09/08/1968  . Alcohol use No  . Drug use: No  . Sexual activity: Yes    Partners: Female    Birth control/ protection: None   Other Topics Concern  . Not on file   Social History Narrative  . No narrative on file     Review of Systems: General: negative for chills, fever, night sweats or weight changes.  Cardiovascular: negative for chest pain, dyspnea on  exertion, edema, orthopnea, palpitations, paroxysmal nocturnal dyspnea or shortness of breath Dermatological: negative for rash Respiratory: negative for cough or wheezing Urologic: negative for hematuria Abdominal: negative for nausea, vomiting, diarrhea, bright red blood per rectum, melena, or hematemesis Neurologic: negative for visual changes, syncope, or dizziness All other systems reviewed and are otherwise negative except as noted above.    Blood pressure 116/73, pulse 81, height 5\' 11"  (1.803 m), weight 236 lb 3.2 oz (107.1 kg).  General appearance: alert and no distress Neck: no adenopathy, no carotid bruit, no JVD, supple, symmetrical, trachea midline and thyroid not enlarged, symmetric, no tenderness/mass/nodules Lungs: clear to auscultation bilaterally Heart: regular rate and rhythm, S1, S2 normal, no murmur, click, rub or gallop Extremities: extremities normal, atraumatic, no cyanosis or edema  EKG Not performed today  ASSESSMENT AND PLAN:   CAD-s/p PCI History of CAD status post inferior STEMI with PCI and stenting of his RCA by myself February 2007. He was restudied June 2008 then have patent stents to proximal RCA with noncritical disease of his proximal LAD and normal LV function. His last Myoview performed 1/8/15was low risk with inferobasal scar unchanged from prior functional studies. He denies chest pain or shortness of breath.  Chronic anticoagulation for PE, DVT Chronic Coumadin anti-coagulation because of prior pulmonary embolus  Essential hypertension History of essential hypertension blood pressure measured today at 116/73. He is on losartan, Amlodipine and metoprolol. Continue current meds at current dosing,   Dyslipidemia History of dyslipidemia on statin therapy followed by his PCP      Lorretta Harp MD [facility], [facility] 10/20/2016 2:31 PM

## 2016-10-20 NOTE — Assessment & Plan Note (Signed)
History of CAD status post inferior STEMI with PCI and stenting of his RCA by myself February 2007. He was restudied June 2008 then have patent stents to proximal RCA with noncritical disease of his proximal LAD and normal LV function. His last Myoview performed 1/8/15was low risk with inferobasal scar unchanged from prior functional studies. He denies chest pain or shortness of breath.

## 2016-10-27 DIAGNOSIS — Z7901 Long term (current) use of anticoagulants: Secondary | ICD-10-CM | POA: Diagnosis not present

## 2016-11-03 ENCOUNTER — Ambulatory Visit (INDEPENDENT_AMBULATORY_CARE_PROVIDER_SITE_OTHER): Payer: Self-pay | Admitting: Urology

## 2016-11-03 DIAGNOSIS — N5201 Erectile dysfunction due to arterial insufficiency: Secondary | ICD-10-CM

## 2016-11-07 DIAGNOSIS — E782 Mixed hyperlipidemia: Secondary | ICD-10-CM | POA: Diagnosis not present

## 2016-11-07 DIAGNOSIS — I10 Essential (primary) hypertension: Secondary | ICD-10-CM | POA: Diagnosis not present

## 2016-11-07 DIAGNOSIS — Z0000 Encounter for general adult medical examination without abnormal findings: Secondary | ICD-10-CM | POA: Diagnosis not present

## 2016-11-07 DIAGNOSIS — E291 Testicular hypofunction: Secondary | ICD-10-CM | POA: Diagnosis not present

## 2016-11-07 DIAGNOSIS — Z1389 Encounter for screening for other disorder: Secondary | ICD-10-CM | POA: Diagnosis not present

## 2016-12-06 DIAGNOSIS — E039 Hypothyroidism, unspecified: Secondary | ICD-10-CM | POA: Diagnosis not present

## 2016-12-15 DIAGNOSIS — H2513 Age-related nuclear cataract, bilateral: Secondary | ICD-10-CM | POA: Diagnosis not present

## 2016-12-15 DIAGNOSIS — H0230 Blepharochalasis unspecified eye, unspecified eyelid: Secondary | ICD-10-CM | POA: Diagnosis not present

## 2016-12-15 DIAGNOSIS — H5203 Hypermetropia, bilateral: Secondary | ICD-10-CM | POA: Diagnosis not present

## 2016-12-15 DIAGNOSIS — H40013 Open angle with borderline findings, low risk, bilateral: Secondary | ICD-10-CM | POA: Diagnosis not present

## 2016-12-21 ENCOUNTER — Other Ambulatory Visit: Payer: Self-pay | Admitting: Cardiovascular Disease

## 2017-01-03 ENCOUNTER — Other Ambulatory Visit: Payer: Self-pay | Admitting: Cardiovascular Disease

## 2017-01-03 NOTE — Telephone Encounter (Signed)
REFILL 

## 2017-01-11 DIAGNOSIS — Z7901 Long term (current) use of anticoagulants: Secondary | ICD-10-CM | POA: Diagnosis not present

## 2017-01-15 ENCOUNTER — Other Ambulatory Visit: Payer: Self-pay | Admitting: Cardiovascular Disease

## 2017-01-15 MED ORDER — LOSARTAN POTASSIUM 50 MG PO TABS: 50.0000 mg | ORAL_TABLET | Freq: Every day | ORAL | 2 refills | Status: DC

## 2017-01-15 NOTE — Telephone Encounter (Signed)
Rx(s) sent to pharmacy electronically.  

## 2017-01-15 NOTE — Telephone Encounter (Signed)
°*  STAT* If patient is at the pharmacy, call can be transferred to refill team.   1. Which medications need to be refilled? (please list name of each medication and dose if known)Losartan 50 mg ( Need new prescription Sent)   2. Which pharmacy/location (including street and city if local pharmacy) is medication to be sent to?[facility] in [place] on [address]   3. Do they need a 30 day or 90 day supply? 22   Steven Oneal changed pharmacy

## 2017-02-15 DIAGNOSIS — Z7901 Long term (current) use of anticoagulants: Secondary | ICD-10-CM | POA: Diagnosis not present

## 2017-02-26 DIAGNOSIS — Z7901 Long term (current) use of anticoagulants: Secondary | ICD-10-CM | POA: Diagnosis not present

## 2017-02-26 DIAGNOSIS — Z23 Encounter for immunization: Secondary | ICD-10-CM | POA: Diagnosis not present

## 2017-03-19 ENCOUNTER — Other Ambulatory Visit: Payer: Self-pay | Admitting: *Deleted

## 2017-03-19 DIAGNOSIS — H5203 Hypermetropia, bilateral: Secondary | ICD-10-CM | POA: Diagnosis not present

## 2017-03-19 DIAGNOSIS — H2513 Age-related nuclear cataract, bilateral: Secondary | ICD-10-CM | POA: Diagnosis not present

## 2017-03-19 DIAGNOSIS — H40013 Open angle with borderline findings, low risk, bilateral: Secondary | ICD-10-CM | POA: Diagnosis not present

## 2017-03-19 MED ORDER — METOPROLOL TARTRATE 25 MG PO TABS: 25.0000 mg | ORAL_TABLET | Freq: Two times a day (BID) | ORAL | 6 refills | Status: DC

## 2017-04-20 DIAGNOSIS — Z7901 Long term (current) use of anticoagulants: Secondary | ICD-10-CM | POA: Diagnosis not present

## 2017-05-25 DIAGNOSIS — Z7901 Long term (current) use of anticoagulants: Secondary | ICD-10-CM | POA: Diagnosis not present

## 2017-07-16 DIAGNOSIS — Z7901 Long term (current) use of anticoagulants: Secondary | ICD-10-CM | POA: Diagnosis not present

## 2017-08-13 DIAGNOSIS — Z7901 Long term (current) use of anticoagulants: Secondary | ICD-10-CM | POA: Diagnosis not present

## 2017-08-20 DIAGNOSIS — Z7901 Long term (current) use of anticoagulants: Secondary | ICD-10-CM | POA: Diagnosis not present

## 2017-08-27 DIAGNOSIS — Z7901 Long term (current) use of anticoagulants: Secondary | ICD-10-CM | POA: Diagnosis not present

## 2017-08-29 ENCOUNTER — Inpatient Hospital Stay (HOSPITAL_COMMUNITY)
Admission: EM | Admit: 2017-08-29 | Discharge: 2017-08-31 | DRG: 247 | Disposition: A | Discharge status: Home / Self Care | Payer: Medicare HMO | Source: Ambulatory Visit | Attending: Cardiovascular Disease | Admitting: Cardiovascular Disease

## 2017-08-29 ENCOUNTER — Other Ambulatory Visit: Payer: Self-pay

## 2017-08-29 ENCOUNTER — Inpatient Hospital Stay (HOSPITAL_COMMUNITY)
Admission: EM | Disposition: A | Discharge status: Home / Self Care | Payer: Self-pay | Source: Ambulatory Visit | Attending: Cardiovascular Disease

## 2017-08-29 DIAGNOSIS — Z800 Family history of malignant neoplasm of digestive organs: Secondary | ICD-10-CM

## 2017-08-29 DIAGNOSIS — I2119 ST elevation (STEMI) myocardial infarction involving other coronary artery of inferior wall: Principal | ICD-10-CM | POA: Diagnosis present

## 2017-08-29 DIAGNOSIS — I213 ST elevation (STEMI) myocardial infarction of unspecified site: Secondary | ICD-10-CM

## 2017-08-29 DIAGNOSIS — Z808 Family history of malignant neoplasm of other organs or systems: Secondary | ICD-10-CM

## 2017-08-29 DIAGNOSIS — Z7901 Long term (current) use of anticoagulants: Secondary | ICD-10-CM

## 2017-08-29 DIAGNOSIS — Z885 Allergy status to narcotic agent status: Secondary | ICD-10-CM | POA: Diagnosis not present

## 2017-08-29 DIAGNOSIS — I2111 ST elevation (STEMI) myocardial infarction involving right coronary artery: Secondary | ICD-10-CM | POA: Diagnosis not present

## 2017-08-29 DIAGNOSIS — I2510 Atherosclerotic heart disease of native coronary artery without angina pectoris: Secondary | ICD-10-CM | POA: Diagnosis present

## 2017-08-29 DIAGNOSIS — Z7982 Long term (current) use of aspirin: Secondary | ICD-10-CM | POA: Diagnosis not present

## 2017-08-29 DIAGNOSIS — K219 Gastro-esophageal reflux disease without esophagitis: Secondary | ICD-10-CM | POA: Diagnosis present

## 2017-08-29 DIAGNOSIS — Z8042 Family history of malignant neoplasm of prostate: Secondary | ICD-10-CM | POA: Diagnosis not present

## 2017-08-29 DIAGNOSIS — I340 Nonrheumatic mitral (valve) insufficiency: Secondary | ICD-10-CM | POA: Diagnosis not present

## 2017-08-29 DIAGNOSIS — I10 Essential (primary) hypertension: Secondary | ICD-10-CM | POA: Diagnosis present

## 2017-08-29 DIAGNOSIS — Z955 Presence of coronary angioplasty implant and graft: Secondary | ICD-10-CM

## 2017-08-29 DIAGNOSIS — Z86711 Personal history of pulmonary embolism: Secondary | ICD-10-CM | POA: Diagnosis not present

## 2017-08-29 DIAGNOSIS — E785 Hyperlipidemia, unspecified: Secondary | ICD-10-CM | POA: Diagnosis not present

## 2017-08-29 DIAGNOSIS — I361 Nonrheumatic tricuspid (valve) insufficiency: Secondary | ICD-10-CM | POA: Diagnosis not present

## 2017-08-29 DIAGNOSIS — Z86718 Personal history of other venous thrombosis and embolism: Secondary | ICD-10-CM | POA: Diagnosis not present

## 2017-08-29 DIAGNOSIS — R0789 Other chest pain: Secondary | ICD-10-CM | POA: Diagnosis not present

## 2017-08-29 DIAGNOSIS — Z9861 Coronary angioplasty status: Secondary | ICD-10-CM

## 2017-08-29 HISTORY — PX: LEFT HEART CATH AND CORONARY ANGIOGRAPHY: CATH118249

## 2017-08-29 HISTORY — PX: CORONARY/GRAFT ACUTE MI REVASCULARIZATION: CATH118305

## 2017-08-29 LAB — POCT I-STAT, CHEM 8
BUN: 11 mg/dL (ref 6–20)
CALCIUM ION: 1.09 mmol/L — AB (ref 1.15–1.40)
CHLORIDE: 83 mmol/L — AB (ref 101–111)
Creatinine, Ser: 0.3 mg/dL — ABNORMAL LOW (ref 0.61–1.24)
GLUCOSE: 87 mg/dL (ref 65–99)
HCT: 37 % — ABNORMAL LOW (ref 39.0–52.0)
Hemoglobin: 12.6 g/dL — ABNORMAL LOW (ref 13.0–17.0)
Potassium: 3 mmol/L — ABNORMAL LOW (ref 3.5–5.1)
SODIUM: 119 mmol/L — AB (ref 135–145)
TCO2: 19 mmol/L — ABNORMAL LOW (ref 22–32)

## 2017-08-29 LAB — PROTIME-INR
INR: 2.11
Prothrombin Time: 23.5 seconds — ABNORMAL HIGH (ref 11.4–15.2)

## 2017-08-29 LAB — COMPREHENSIVE METABOLIC PANEL
ALBUMIN: 3.6 g/dL (ref 3.5–5.0)
ALT: 55 U/L (ref 17–63)
ANION GAP: 10 (ref 5–15)
AST: 49 U/L — ABNORMAL HIGH (ref 15–41)
Alkaline Phosphatase: 37 U/L — ABNORMAL LOW (ref 38–126)
BUN: 9 mg/dL (ref 6–20)
CHLORIDE: 104 mmol/L (ref 101–111)
CO2: 23 mmol/L (ref 22–32)
CREATININE: 0.74 mg/dL (ref 0.61–1.24)
Calcium: 8.6 mg/dL — ABNORMAL LOW (ref 8.9–10.3)
GFR calc non Af Amer: >60 mL/min (ref >60)
Glucose, Bld: 133 mg/dL — ABNORMAL HIGH (ref 65–99)
Potassium: 3.7 mmol/L (ref 3.5–5.1)
SODIUM: 137 mmol/L (ref 135–145)
Total Bilirubin: 0.9 mg/dL (ref 0.3–1.2)
Total Protein: 5.8 g/dL — ABNORMAL LOW (ref 6.5–8.1)

## 2017-08-29 LAB — POCT ACTIVATED CLOTTING TIME: Activated Clotting Time: 285 seconds

## 2017-08-29 LAB — CBC
HCT: 40.9 % (ref 39.0–52.0)
HEMOGLOBIN: 14 g/dL (ref 13.0–17.0)
MCH: 29.7 pg (ref 26.0–34.0)
MCHC: 34.2 g/dL (ref 30.0–36.0)
MCV: 86.8 fL (ref 78.0–100.0)
Platelets: 202 10*3/uL (ref 150–400)
RBC: 4.71 MIL/uL (ref 4.22–5.81)
RDW: 12.7 % (ref 11.5–15.5)
WBC: 9.9 10*3/uL (ref 4.0–10.5)

## 2017-08-29 LAB — MRSA PCR SCREENING: MRSA by PCR: NEGATIVE

## 2017-08-29 LAB — TROPONIN I: Troponin I: 0.47 ng/mL (ref <0.03)

## 2017-08-29 SURGERY — LEFT HEART CATH AND CORONARY ANGIOGRAPHY
Anesthesia: LOCAL

## 2017-08-29 MED ORDER — ASPIRIN EC 81 MG PO TBEC
81.0000 mg | DELAYED_RELEASE_TABLET | Freq: Every day | ORAL | Status: DC
  Administered 2017-08-30: 81 mg via ORAL
  Filled 2017-08-29: qty 1

## 2017-08-29 MED ORDER — TIROFIBAN HCL IN NACL 5-0.9 MG/100ML-% IV SOLN
INTRAVENOUS | Status: AC | PRN
  Administered 2017-08-29: 0.15 ug/kg/min via INTRAVENOUS

## 2017-08-29 MED ORDER — NITROGLYCERIN 1 MG/10 ML FOR IR/CATH LAB
INTRA_ARTERIAL | Status: AC
  Filled 2017-08-29: qty 10

## 2017-08-29 MED ORDER — SODIUM CHLORIDE 0.9% FLUSH
3.0000 mL | Freq: Two times a day (BID) | INTRAVENOUS | Status: DC
  Administered 2017-08-30 (×2): 3 mL via INTRAVENOUS

## 2017-08-29 MED ORDER — TIROFIBAN (AGGRASTAT) BOLUS VIA INFUSION
INTRAVENOUS | Status: DC | PRN
  Administered 2017-08-29: 2675 ug via INTRAVENOUS

## 2017-08-29 MED ORDER — HEPARIN SODIUM (PORCINE) 1000 UNIT/ML IJ SOLN
INTRAMUSCULAR | Status: DC | PRN
  Administered 2017-08-29: 10000 [IU] via INTRAVENOUS

## 2017-08-29 MED ORDER — MIDAZOLAM HCL 2 MG/2ML IJ SOLN
INTRAMUSCULAR | Status: AC
  Filled 2017-08-29: qty 2

## 2017-08-29 MED ORDER — LABETALOL HCL 5 MG/ML IV SOLN: 10.0000 mg | INTRAVENOUS | Status: AC | PRN

## 2017-08-29 MED ORDER — ONDANSETRON HCL 4 MG/2ML IJ SOLN: 4.0000 mg | Freq: Four times a day (QID) | INTRAMUSCULAR | Status: DC | PRN

## 2017-08-29 MED ORDER — TICAGRELOR 90 MG PO TABS
ORAL_TABLET | ORAL | Status: AC
  Filled 2017-08-29: qty 1

## 2017-08-29 MED ORDER — FENTANYL CITRATE (PF) 100 MCG/2ML IJ SOLN
INTRAMUSCULAR | Status: DC | PRN
  Administered 2017-08-29: 25 ug via INTRAVENOUS

## 2017-08-29 MED ORDER — ACETAMINOPHEN 325 MG PO TABS: 650.0000 mg | ORAL_TABLET | ORAL | Status: DC | PRN

## 2017-08-29 MED ORDER — SODIUM CHLORIDE 0.9% FLUSH: 3.0000 mL | INTRAVENOUS | Status: DC | PRN

## 2017-08-29 MED ORDER — HEPARIN SODIUM (PORCINE) 1000 UNIT/ML IJ SOLN
INTRAMUSCULAR | Status: AC
  Filled 2017-08-29: qty 1

## 2017-08-29 MED ORDER — METOPROLOL TARTRATE 25 MG PO TABS
25.0000 mg | ORAL_TABLET | Freq: Two times a day (BID) | ORAL | Status: DC
  Administered 2017-08-29 – 2017-08-31 (×4): 25 mg via ORAL
  Filled 2017-08-29 (×2): qty 1
  Filled 2017-08-29 (×2): qty 2

## 2017-08-29 MED ORDER — TICAGRELOR 90 MG PO TABS
90.0000 mg | ORAL_TABLET | Freq: Two times a day (BID) | ORAL | Status: DC
  Administered 2017-08-30 – 2017-08-31 (×3): 90 mg via ORAL
  Filled 2017-08-29 (×4): qty 1

## 2017-08-29 MED ORDER — IOPAMIDOL (ISOVUE-370) INJECTION 76%
INTRAVENOUS | Status: AC
  Filled 2017-08-29: qty 125

## 2017-08-29 MED ORDER — ATORVASTATIN CALCIUM 80 MG PO TABS
80.0000 mg | ORAL_TABLET | Freq: Every day | ORAL | Status: DC
  Administered 2017-08-29 – 2017-08-30 (×2): 80 mg via ORAL
  Filled 2017-08-29 (×2): qty 1

## 2017-08-29 MED ORDER — AMLODIPINE BESYLATE 5 MG PO TABS
5.0000 mg | ORAL_TABLET | Freq: Every day | ORAL | Status: DC
  Administered 2017-08-30 – 2017-08-31 (×2): 5 mg via ORAL
  Filled 2017-08-29 (×2): qty 1

## 2017-08-29 MED ORDER — HEPARIN (PORCINE) IN NACL 1000-0.9 UT/500ML-% IV SOLN
INTRAVENOUS | Status: AC
  Filled 2017-08-29: qty 1000

## 2017-08-29 MED ORDER — FENTANYL CITRATE (PF) 100 MCG/2ML IJ SOLN
INTRAMUSCULAR | Status: AC
  Filled 2017-08-29: qty 2

## 2017-08-29 MED ORDER — TICAGRELOR 90 MG PO TABS
ORAL_TABLET | ORAL | Status: DC | PRN
  Administered 2017-08-29: 180 mg via ORAL

## 2017-08-29 MED ORDER — LOSARTAN POTASSIUM 50 MG PO TABS
50.0000 mg | ORAL_TABLET | Freq: Every day | ORAL | Status: DC
  Administered 2017-08-30 – 2017-08-31 (×2): 50 mg via ORAL
  Filled 2017-08-29 (×2): qty 1

## 2017-08-29 MED ORDER — LIDOCAINE HCL (PF) 1 % IJ SOLN
INTRAMUSCULAR | Status: AC
  Filled 2017-08-29: qty 30

## 2017-08-29 MED ORDER — SODIUM CHLORIDE 0.9 % IV SOLN: 250.0000 mL | INTRAVENOUS | Status: DC | PRN

## 2017-08-29 MED ORDER — PANTOPRAZOLE SODIUM 40 MG PO TBEC
40.0000 mg | DELAYED_RELEASE_TABLET | Freq: Every day | ORAL | Status: DC
  Administered 2017-08-30 – 2017-08-31 (×2): 40 mg via ORAL
  Filled 2017-08-29 (×2): qty 1

## 2017-08-29 MED ORDER — HYDRALAZINE HCL 20 MG/ML IJ SOLN: 5.0000 mg | INTRAMUSCULAR | Status: AC | PRN

## 2017-08-29 MED ORDER — TIROFIBAN HCL IN NACL 5-0.9 MG/100ML-% IV SOLN
INTRAVENOUS | Status: AC
  Filled 2017-08-29: qty 100

## 2017-08-29 MED ORDER — VERAPAMIL HCL 2.5 MG/ML IV SOLN
INTRAVENOUS | Status: DC | PRN
  Administered 2017-08-29: 10 mL via INTRA_ARTERIAL

## 2017-08-29 MED ORDER — MIDAZOLAM HCL 2 MG/2ML IJ SOLN
INTRAMUSCULAR | Status: DC | PRN
  Administered 2017-08-29: 1 mg via INTRAVENOUS

## 2017-08-29 MED ORDER — LIDOCAINE HCL (PF) 1 % IJ SOLN
INTRAMUSCULAR | Status: DC | PRN
  Administered 2017-08-29: 2 mL

## 2017-08-29 MED ORDER — HEPARIN (PORCINE) IN NACL 2-0.9 UNITS/ML
INTRAMUSCULAR | Status: AC | PRN
  Administered 2017-08-29 (×2): 500 mL

## 2017-08-29 MED ORDER — IOPAMIDOL (ISOVUE-370) INJECTION 76%
INTRAVENOUS | Status: DC | PRN
  Administered 2017-08-29: 95 mL

## 2017-08-29 MED ORDER — TAMSULOSIN HCL 0.4 MG PO CAPS
0.4000 mg | ORAL_CAPSULE | Freq: Every day | ORAL | Status: DC
  Administered 2017-08-30 – 2017-08-31 (×2): 0.4 mg via ORAL
  Filled 2017-08-29 (×2): qty 1

## 2017-08-29 MED ORDER — SODIUM CHLORIDE 0.9 % IV SOLN: INTRAVENOUS | Status: AC

## 2017-08-29 MED ORDER — VERAPAMIL HCL 2.5 MG/ML IV SOLN
INTRAVENOUS | Status: AC
  Filled 2017-08-29: qty 2

## 2017-08-29 SURGICAL SUPPLY — 17 items
BALLN SAPPHIRE 2.5X15 (BALLOONS) ×2
BALLN SAPPHIRE ~~LOC~~ 3.0X15 (BALLOONS) ×1 IMPLANT
BALLOON SAPPHIRE 2.5X15 (BALLOONS) IMPLANT
CATH INFINITI 5 FR JL3.5 (CATHETERS) ×1 IMPLANT
CATH INFINITI JR4 5F (CATHETERS) ×1 IMPLANT
CATH LAUNCHER 6FR AL.75 (CATHETERS) ×1 IMPLANT
DEVICE RAD COMP TR BAND LRG (VASCULAR PRODUCTS) ×1 IMPLANT
GLIDESHEATH SLEND SS 6F .021 (SHEATH) ×1 IMPLANT
GUIDEWIRE INQWIRE 1.5J.035X260 (WIRE) IMPLANT
INQWIRE 1.5J .035X260CM (WIRE) ×2
KIT ENCORE 26 ADVANTAGE (KITS) ×1 IMPLANT
KIT HEART LEFT (KITS) ×2 IMPLANT
PACK CARDIAC CATHETERIZATION (CUSTOM PROCEDURE TRAY) ×2 IMPLANT
STENT SYNERGY DES 2.75X20 (Permanent Stent) ×1 IMPLANT
TRANSDUCER W/STOPCOCK (MISCELLANEOUS) ×2 IMPLANT
TUBING CIL FLEX 10 FLL-RA (TUBING) ×2 IMPLANT
WIRE COUGAR XT STRL 190CM (WIRE) ×1 IMPLANT

## 2017-08-29 NOTE — H&P (Signed)
Cardiology Admission History and Physical:   Patient ID: Steven Oneal; MRN: [id number]; DOB: 1945/05/13   Admission date: 08/29/2017  Primary Care Provider: Redmond School, MD Primary Cardiologist: [name]  Chief Complaint:  Inferior STEMI  Patient Profile:   Steven Oneal is a 72 y.o. male with a history of chronic DVT/PE on coumadin, CAD with placement of 2 Cypher DES in the RCA in 2007, moderate LAD disease, HLD, HTN presenting with an inferior STEMI.   History of Present Illness:   Steven Oneal is a 72 y.o. male with a history of chronic DVT/PE on coumadin, CAD with placement of 2 Cypher DES in the RCA in 2007, moderate LAD disease, HLD, HTN presenting with an inferior STEMI.  Remote stenting of the ostial and mid RCA in 2007. No cath since 2008. He is on coumadin for chronic DVT and PE. INR under 2.0 three days ago per pt.   Chest pain started around 3 pm. He called EMS. EKG with inferior ST elevation. Code STEMI called. Pt c/o 6/10 chest pain upon arrival to [facility] ED with radiation into his right arm. Associated dyspnea. EMS administered 3 SL NTG, ASA 324 mg and morphine 4 mg. Pt taken to cath lab for emergent cath and PCI.    Past Medical History:  Diagnosis Date  . Allergic rhinitis   . Anticoagulated on Coumadin   . CAD (coronary artery disease) cardiologist-  dr berry   DES Cypher x 2 mid & ostial RCA 2007; Repeat LHC 06/2014 angiographically minimal CAD with widely patent RCA stents EF 60-65%   . Dyslipidemia   . ED (erectile dysfunction)    vascular  . Essential hypertension   . First degree heart block   . GERD (gastroesophageal reflux disease)   . Hiatal hernia   . History of adenomatous polyp of colon    tubular adenoma 08-10-2010  . History of DVT (deep vein thrombosis) 11/08/2012   LLE  . History of pulmonary embolus (PE) 11/08/2012   LLL  . History of ST elevation myocardial infarction (STEMI) 06/15/2005   inferior wall stemi-- s/p  pci and des x2 to  rca  . Nocturia   . PAC (premature atrial contraction)   . S/P drug eluting coronary stent placement 06/15/2005   to mid and ostial RCA  . Vertigo   . Wears dentures    upper denture and lower partial    Past Surgical History:  Procedure Laterality Date  . CARDIAC CATHETERIZATION  10/09/2005   Patent stents, medical therapy  . CARDIAC CATHETERIZATION  10/22/2006   Nonobstructive disease, 60% LAD, patent RCA stents  . CARDIOVASCULAR STRESS TEST  05-08-2013  dr berry   Low risk nuclear perfusion study w/ basal inferior wal nontransmural infarction and no evidence ischemia/   normal LV function and mild basal inferior wall hypokinesis,  ef 65%  . CHOLECYSTECTOMY N/A 07/14/2013   Procedure: LAPAROSCOPIC CHOLECYSTECTOMY;  Surgeon: Jamesetta So, MD;  Location: [facility] ORS;  Service: General;  Laterality: N/A;  . COLONOSCOPY N/A 08/03/2015   Procedure: COLONOSCOPY;  Surgeon: Daneil Dolin, MD;  Location: [facility] ENDO SUITE;  Service: Endoscopy;  Laterality: N/A;  1230  . CORONARY ANGIOPLASTY WITH STENT PLACEMENT  06/15/2005  dr Tami Ribas   Cypher stent to mid RCA; cypher stent -ostial RCA, RESIDUAL disease LAD  . ESOPHAGOGASTRODUODENOSCOPY N/A 08/03/2015   Procedure: ESOPHAGOGASTRODUODENOSCOPY (EGD);  Surgeon: Daneil Dolin, MD;  Location: [facility] END[facility] SUITE;  Service: Endoscopy;  Laterality: N/A;  . LAPAROSCOPIC  APPENDECTOMY  03/10/2008  . LEFT HEART CATHETERIZATION WITH CORONARY ANGIOGRAM N/A 06/12/2014   Procedure: LEFT HEART CATHETERIZATION WITH CORONARY ANGIOGRAM;  Surgeon: Leonie Man, MD;  Location: [facility] CATH LAB;  Service: Cardiovascular;  Laterality: N/A;  Minimal CAD w/ widely patent RCA stents;  well-preserved LVEF w/ normal EDP   . PENILE PROSTHESIS IMPLANT N/A 09/19/2016   Procedure: INSERTION SEMI-RIGID PENILE PROSTHESIS COLOPLAST;  Surgeon: Irine Seal, MD;  Location: [facility];  Service: Urology;  Laterality: N/A;     Medications Prior to Admission: Prior to Admission  medications   Medication Sig Start Date End Date Taking? Authorizing Provider  Alpha Lipoic Acid 200 MG CAPS Take 1 capsule by mouth daily.    [provider]  amLODipine (NORVASC) 5 MG tablet TAKE ONE TABLET BY MOUTH ONCE DAILY 01/03/17   Lorretta Harp, MD  aspirin EC 81 MG tablet Take 81 mg by mouth daily.    [provider]  diazepam (VALIUM) 10 MG tablet Take 10 mg by mouth at bedtime as needed for anxiety or sleep.     [provider]  fish oil-omega-3 fatty acids 1000 MG capsule Take 1 g by mouth 3 (three) times daily.     [provider]  losartan (COZAAR) 50 MG tablet Take 1 tablet (50 mg total) by mouth daily. 01/15/17   Lorretta Harp, MD  meclizine (ANTIVERT) 25 MG tablet Take 25 mg by mouth as needed for dizziness (FOR INNER EAR).  05/13/13   [provider]  metoprolol tartrate (LOPRESSOR) 25 MG tablet Take 1 tablet (25 mg total) 2 (two) times daily by mouth. 03/19/17   Lorretta Harp, MD  Multiple Vitamin (MULTIVITAMIN WITH MINERALS) TABS tablet Take 1 tablet by mouth daily.    [provider]  omeprazole (PRILOSEC) 20 MG capsule Take 20 mg by mouth every morning.     [provider]  pravastatin (PRAVACHOL) 80 MG tablet TAKE ONE TABLET BY MOUTH ONCE DAILY 12/22/16   Lorretta Harp, MD  tamsulosin (FLOMAX) 0.4 MG CAPS capsule Take 1 capsule (0.4 mg total) by mouth daily. 09/19/16   Irine Seal, MD  warfarin (COUMADIN) 4 MG tablet Take 1 tablet by mouth daily. Daily 04/09/14   [provider]     Allergies:    Allergies  Allergen Reactions  . Codeine Hives, Itching and Nausea Only  . Niaspan [Niacin Er] Other (See Comments)    MAKES SKIN FLUSH EXCESSIVELY    Social History:   Social History   Socioeconomic History  . Marital status: Married    Spouse name: Not on file  . Number of children: 2  . Years of education: Not on file  . Highest education level: Not on file  Occupational History  .  Occupation: disablity    Comment: CAD  Social Needs  . Financial resource strain: Not on file  . Food insecurity:    Worry: Not on file    Inability: Not on file  . Transportation needs:    Medical: Not on file    Non-medical: Not on file  Tobacco Use  . Smoking status: Former Smoker    Packs/day: 0.10    Years: 13.00    Pack years: 1.30    Types: Cigarettes    Last attempt to quit: 05/27/1977    Years since quitting: 40.2  . Smokeless tobacco: Former Systems developer    Quit date: 09/08/1968  Substance and Sexual Activity  . Alcohol use:  No  . Drug use: No  . Sexual activity: Yes    Partners: Female    Birth control/protection: None  Lifestyle  . Physical activity:    Days per week: Not on file    Minutes per session: Not on file  . Stress: Not on file  Relationships  . Social connections:    Talks on phone: Not on file    Gets together: Not on file    Attends religious service: Not on file    Active member of club or organization: Not on file    Attends meetings of clubs or organizations: Not on file    Relationship status: Not on file  . Intimate partner violence:    Fear of current or ex partner: Not on file    Emotionally abused: Not on file    Physically abused: Not on file    Forced sexual activity: Not on file  Other Topics Concern  . Not on file  Social History Narrative  . Not on file    Family History:   The patient's family history includes Colon cancer (age of onset: 42) in his brother; Colon cancer (age of onset: 23) in his paternal uncle and paternal uncle; Colon cancer (age of onset: 68) in his mother; Prostate cancer in his brother; Pulmonary embolism in his brother; Throat cancer (age of onset: 94) in his brother.    ROS:  Please see the history of present illness.  All other ROS reviewed and negative.     Physical Exam/Data:   Vitals:   08/29/17 1814 08/29/17 1819 08/29/17 1824 08/29/17 1829  BP: 125/75 133/76 131/76 131/76  Pulse: 85 84 82 82  Resp:  17 17 18 13   SpO2: 96% 96% 97% 97%   No intake or output data in the 24 hours ending 08/29/17 1847 There were no vitals filed for this visit. There is no height or weight on file to calculate BMI.  General:  Well nourished, well developed, acute distress HEENT: normal Lymph: no adenopathy Neck: no JVD Endocrine:  No thryomegaly Vascular: No carotid bruits; FA pulses 2+ bilaterally without bruits  Cardiac:  normal S1, S2; RRR; no murmur  Lungs:  clear to auscultation bilaterally, no wheezing, rhonchi or rales  Abd: soft, nontender, no hepatomegaly  Ext: no edema Musculoskeletal:  No deformities, BUE and BLE strength normal and equal Skin: warm and dry  Neuro:  CNs 2-12 intact, no focal abnormalities noted Psych:  Normal affect    EKG:  The ECG that was done shows sinus and inferior ST elevation.   Relevant CV Studies:   Laboratory Data:  Chemistry Recent Labs  Lab 08/29/17 1819  NA 119*  K 3.0*  CL 83*  GLUCOSE 87  BUN 11  CREATININE 0.30*    No results for input(s): PROT, ALBUMIN, AST, ALT, ALKPHOS, BILITOT in the last 168 hours. Hematology Recent Labs  Lab 08/29/17 1819  HGB 12.6*  HCT 37.0*   Cardiac EnzymesNo results for input(s): TROPONINI in the last 168 hours. No results for input(s): TROPIPOC in the last 168 hours.  BNPNo results for input(s): BNP, PROBNP in the last 168 hours.  DDimer No results for input(s): DDIMER in the last 168 hours.  Radiology/Studies:  No results found.  Assessment and Plan:   1. Acute inferior STEMI/CAD: Will plan emergent cardiac cath with probable PCI. Further plans to follow after cath.   Severity of Illness: The appropriate patient status for this patient is INPATIENT. Inpatient status  is judged to be reasonable and necessary in order to provide the required intensity of service to ensure the patient's safety. The patient's presenting symptoms, physical exam findings, and initial radiographic and laboratory data in the  context of their chronic comorbidities is felt to place them at high risk for further clinical deterioration. Furthermore, it is not anticipated that the patient will be medically stable for discharge from the hospital within 2 midnights of admission. The following factors support the patient status of inpatient.   " The patient's presenting symptoms include inferior STEMI. " The worrisome physical exam findings include CP, EKG changes "  " The chronic co-morbidities include  CAD, HTN, HLD   * I certify that at the point of admission it is my clinical judgment that the patient will require inpatient hospital care spanning beyond 2 midnights from the point of admission due to high intensity of service, high risk for further deterioration and high frequency of surveillance required.*    For questions or updates, please contact [place] Please consult [email or web address] for contact info under Cardiology/STEMI.    Signed, Lauree Chandler, MD  08/29/2017 6:47 PM

## 2017-08-30 ENCOUNTER — Encounter (HOSPITAL_COMMUNITY): Payer: Self-pay | Admitting: Cardiovascular Disease

## 2017-08-30 ENCOUNTER — Inpatient Hospital Stay (HOSPITAL_COMMUNITY): Payer: Medicare HMO

## 2017-08-30 DIAGNOSIS — I340 Nonrheumatic mitral (valve) insufficiency: Secondary | ICD-10-CM

## 2017-08-30 DIAGNOSIS — I361 Nonrheumatic tricuspid (valve) insufficiency: Secondary | ICD-10-CM

## 2017-08-30 LAB — ECHOCARDIOGRAM COMPLETE
Height: 72 in
WEIGHTICAEL: 3679.0365 [oz_av]

## 2017-08-30 LAB — BASIC METABOLIC PANEL
Anion gap: 10 (ref 5–15)
BUN: 7 mg/dL (ref 6–20)
CALCIUM: 8.6 mg/dL — AB (ref 8.9–10.3)
CO2: 24 mmol/L (ref 22–32)
CREATININE: 0.77 mg/dL (ref 0.61–1.24)
Chloride: 103 mmol/L (ref 101–111)
GFR calc Af Amer: >60 mL/min (ref >60)
Glucose, Bld: 228 mg/dL — ABNORMAL HIGH (ref 65–99)
POTASSIUM: 4 mmol/L (ref 3.5–5.1)
SODIUM: 137 mmol/L (ref 135–145)

## 2017-08-30 LAB — CBC
HCT: 39.5 % (ref 39.0–52.0)
Hemoglobin: 13.6 g/dL (ref 13.0–17.0)
MCH: 30.1 pg (ref 26.0–34.0)
MCHC: 34.4 g/dL (ref 30.0–36.0)
MCV: 87.4 fL (ref 78.0–100.0)
PLATELETS: 190 10*3/uL (ref 150–400)
RBC: 4.52 MIL/uL (ref 4.22–5.81)
RDW: 12.7 % (ref 11.5–15.5)
WBC: 7.9 10*3/uL (ref 4.0–10.5)

## 2017-08-30 LAB — PROTIME-INR
INR: 1.81
PROTHROMBIN TIME: 20.9 s — AB (ref 11.4–15.2)

## 2017-08-30 LAB — TROPONIN I
TROPONIN I: 2.45 ng/mL — AB (ref <0.03)
Troponin I: 1.86 ng/mL (ref <0.03)

## 2017-08-30 MED ORDER — WARFARIN SODIUM 5 MG PO TABS
5.0000 mg | ORAL_TABLET | Freq: Once | ORAL | Status: AC
  Administered 2017-08-30: 5 mg via ORAL
  Filled 2017-08-30: qty 1

## 2017-08-30 MED ORDER — DIAZEPAM 5 MG PO TABS
10.0000 mg | ORAL_TABLET | Freq: Every evening | ORAL | Status: DC | PRN
  Administered 2017-08-30 – 2017-08-31 (×2): 10 mg via ORAL
  Filled 2017-08-30 (×2): qty 2

## 2017-08-30 MED ORDER — WARFARIN - PHARMACIST DOSING INPATIENT: Freq: Every day | Status: DC

## 2017-08-30 MED FILL — Heparin Sod (Porcine)-NaCl IV Soln 1000 Unit/500ML-0.9%: INTRAVENOUS | Qty: 1000 | Status: AC

## 2017-08-30 NOTE — Progress Notes (Addendum)
Progress Note  Patient Name: Steven Oneal Date of Encounter: 08/30/2017  Primary Cardiologist: No primary care provider on file.  Subjective   Feeling well this morning.   Inpatient Medications    Scheduled Meds: . amLODipine  5 mg Oral Daily  . aspirin EC  81 mg Oral Daily  . atorvastatin  80 mg Oral q1800  . losartan  50 mg Oral Daily  . metoprolol tartrate  25 mg Oral BID  . pantoprazole  40 mg Oral Daily  . sodium chloride flush  3 mL Intravenous Q12H  . tamsulosin  0.4 mg Oral Daily  . ticagrelor  90 mg Oral BID   Continuous Infusions: . sodium chloride     PRN Meds: sodium chloride, acetaminophen, diazepam, ondansetron (ZOFRAN) IV, sodium chloride flush   Vital Signs    Vitals:   08/30/17 0758 08/30/17 0800 08/30/17 0903 08/30/17 1000  BP: 139/72 128/75  129/77  Pulse: 76 76  74  Resp: 16 20  20   Temp:   97.6 F (36.4 C)   TempSrc:   Oral   SpO2: 97% 96%  97%  Weight:      Height:        Intake/Output Summary (Last 24 hours) at 08/30/2017 1044 Last data filed at 08/30/2017 1000 Gross per 24 hour  Intake 1083 ml  Output 375 ml  Net 708 ml   Filed Weights   08/29/17 1913  Weight: 229 lb 15 oz (104.3 kg)    Telemetry    SR - Personally Reviewed  ECG    SR with 1st degree AVB - Personally Reviewed  Physical Exam   General: Well developed, well nourished, male appearing in no acute distress. Head: Normocephalic, atraumatic.  Neck: Supple without bruits, JVD. Lungs:  Resp regular and unlabored, CTA. Heart: RRR, S1, S2, no S3, S4, or murmur; no rub. Abdomen: Soft, non-tender, non-distended with normoactive bowel sounds. . Extremities: No clubbing, cyanosis, edema. Distal pedal pulses are 2+ bilaterally. Right radial cath site stable.  Neuro: Alert and oriented X 3. Moves all extremities spontaneously. Psych: Normal affect.  Labs    Chemistry Recent Labs  Lab 08/29/17 1819 08/29/17 1942  NA 119* 137  K 3.0* 3.7  CL 83* 104  CO2   --  23  GLUCOSE 87 133*  BUN 11 9  CREATININE 0.30* 0.74  CALCIUM  --  8.6*  PROT  --  5.8*  ALBUMIN  --  3.6  AST  --  49*  ALT  --  55  ALKPHOS  --  37*  BILITOT  --  0.9  GFRNONAA  --  >60  GFRAA  --  >60  ANIONGAP  --  10     Hematology Recent Labs  Lab 08/29/17 1819 08/29/17 1942  WBC  --  9.9  RBC  --  4.71  HGB 12.6* 14.0  HCT 37.0* 40.9  MCV  --  86.8  MCH  --  29.7  MCHC  --  34.2  RDW  --  12.7  PLT  --  202    Cardiac Enzymes Recent Labs  Lab 08/29/17 1942 08/30/17 0106  TROPONINI 0.47* 1.86*   No results for input(s): TROPIPOC in the last 168 hours.   BNPNo results for input(s): BNP, PROBNP in the last 168 hours.   DDimer No results for input(s): DDIMER in the last 168 hours.    Radiology    No results found.  Cardiac Studies   Cath:  08/29/17  Conclusion     Ost RCA to Mid RCA lesion is 30% stenosed.  Mid RCA lesion is 99% stenosed.  Ost 1st Mrg lesion is 20% stenosed.  Prox Cx to Mid Cx lesion is 20% stenosed.  Ost LAD to Prox LAD lesion is 30% stenosed.  Prox LAD to Mid LAD lesion is 20% stenosed.  Mid LAD to Dist LAD lesion is 20% stenosed.  A drug-eluting stent was successfully placed using a STENT SYNERGY DES 2.75X20.  Post intervention, there is a 0% residual stenosis.   1. Acute inferior STEMI 2. Sub-total occlusion of the mid RCA 3. Successful PTCA/DES x 1 mid RCA 4. Mild non-obstructive disease in the LAD and circumflex.   Recommendations: Will admit to [facility] ICU. Will continue DAPT with ASA and Brilinta for one month and then will stop ASA. (He is also on chronic coumadin. Plan would be to continue Brilinta along with coumadin for one year. ). He will be restarted on coumadin tomorrow. INR will be arranged now. If his INR is below 2.0, can consider IV heparin but this should not start before the sheath has been out of his radial artery for 8 hours. He is on coumadin for h/o DVT and PE. Will continue statin, beta  blocker. Echo tomorrow.    TTE: pending  Patient Profile     72 y.o. male with a history of chronic DVT/PE on coumadin, CAD with placement of 2 Cypher DES in the RCA in 2007, moderate LAD disease, HLD, HTN presenting with an inferior STEMI.   Assessment & Plan    1. STEMI: Underwent cath noted above with Dr. Angelena Form with successful PCI/DESxq to the [facility] below previously placed patent stent. Trop peaked at 1.86. Felt well during the evening without further chest pain. Plan is for DAPT with ASA/Brilinta though he is on chronic coumadin as well. Denies any issues with plavix with previous stent? Will need triple therapy. INR was 2.11. Will plan to resume coumadin today. Plan to work with cardiac rehab. Suspect he could transfer from the unit today. -- on ASA, statin, BB and Brilinta -- echo at bedside  2. Chronic DVT/PE: on coumadin prior to admission. INR 2.11.   3. HTN: stable with current therapy  4. HL: had been on pravastatin 80mg  as outpatient, switched to Lipitor 80mg .  -- check lipids in am  Signed, Reino Bellis, NP  08/30/2017, 10:44 AM  Pager # [phone]   Patient seen, examined. Available data reviewed. Agree with findings, assessment, and plan as outlined by Reino Bellis, NP.  On my exam the patient is alert and oriented in no distress.  Lung fields are clear, heart is regular rate and rhythm with no murmur gallop, jugular venous pressure is normal, right radial site is clear, there is no pretibial edema, abdomen is soft and nontender.  Considering his need for chronic oral anticoagulation with warfarin in the context of DVT/PE, I think  bleeding risk associated with triple therapy may be excessive and would favor discontinuation of aspirin.  We will treat him with ticagrelor monotherapy and warfarin.  He looks good today and is stable to transfer to a telemetry bed.  Other medications are reviewed and I agree with switching him to a high intensity statin drug.  Anticipate  hospital discharge tomorrow morning.  Sherren Mocha, M.D. 08/30/2017 12:42 PM   For questions or updates, please contact [place] Please consult [email or web address] for contact info under Cardiology/STEMI.

## 2017-08-30 NOTE — Progress Notes (Signed)
CARDIAC REHAB PHASE I   PRE:  Rate/Rhythm: 76 SR  BP:  Supine:   Sitting: 109/78  Standing:    SaO2: 97%RA  MODE:  Ambulation: 370 ft   POST:  Rate/Rhythm: 84 SR few PVCs  BP:  Supine:   Sitting: 129/76  Standing:    SaO2: 96%RA 1139-121 Pt walked 370 ft on RA with steady gait and tolerated well. No CP. MI education completed with pt who voiced understanding. Stressed importance of brilinta with stent. Needs to see case manager. Reviewed MI restrictions, NTG use, ex ed and gave heart healthy diet. Referred to [place] Phase 2.    Graylon Good, RN BSN  08/30/2017 12:13 PM

## 2017-08-30 NOTE — Progress Notes (Signed)
ANTICOAGULATION CONSULT NOTE - Initial Consult  Pharmacy Consult for Warfarin Indication: history of chronic pulmonary embolus and DVT  Allergies  Allergen Reactions  . Codeine Hives, Itching and Nausea Only  . Niaspan [Niacin Er] Other (See Comments)    MAKES SKIN FLUSH EXCESSIVELY    Patient Measurements: Height: 6' (182.9 cm) Weight: 229 lb 15 oz (104.3 kg) IBW/kg (Calculated) : 77.6  Vital Signs: Temp: 97.6 F (36.4 C) (05/02 1201) Temp Source: Oral (05/02 1201) BP: 117/74 (05/02 1200) Pulse Rate: 76 (05/02 1200)  Labs: Recent Labs    08/29/17 1819 08/29/17 1942 08/30/17 0106  HGB 12.6* 14.0  --   HCT 37.0* 40.9  --   PLT  --  202  --   LABPROT  --  23.5*  --   INR  --  2.11  --   CREATININE 0.30* 0.74  --   TROPONINI  --  0.47* 1.86*    Estimated Creatinine Clearance: 105.8 mL/min (by C-G formula based on SCr of 0.74 mg/dL).   Medical History: Past Medical History:  Diagnosis Date  . Allergic rhinitis   . Anticoagulated on Coumadin   . CAD (coronary artery disease) cardiologist-  dr berry   DES Cypher x 2 mid & ostial RCA 2007; Repeat LHC 06/2014 angiographically minimal CAD with widely patent RCA stents EF 60-65%   . Dyslipidemia   . ED (erectile dysfunction)    vascular  . Essential hypertension   . First degree heart block   . GERD (gastroesophageal reflux disease)   . Hiatal hernia   . History of adenomatous polyp of colon    tubular adenoma 08-10-2010  . History of DVT (deep vein thrombosis) 11/08/2012   LLE  . History of pulmonary embolus (PE) 11/08/2012   LLL  . History of ST elevation myocardial infarction (STEMI) 06/15/2005   inferior wall stemi-- s/p  pci and des x2 to rca  . Nocturia   . PAC (premature atrial contraction)   . S/P drug eluting coronary stent placement 06/15/2005   to mid and ostial RCA  . Vertigo   . Wears dentures    upper denture and lower partial    Assessment: 72 y.o. male with a history of chronic DVT/PE on  warfarin, CAD with placement of DES in RCA in 2007, moderate LAD disease, HLD, HTN who presented with inferior STEMI on 5/1. Patient now post-DES stent placement to [facility] and started on DAPT (ASA/Brilinta). Pharmacy consulted to restart home warfarin for hx of DVT/PE.   PTA warfarin is 5 mg daily, on 5/1 INR 2.11. 100% PO intake. DDI noted for bleeding with ticagrelor, ASA, and warfarin.   Goal of Therapy:  INR 2-3 Monitor platelets by anticoagulation protocol: Yes   Plan:  Warfarin 5mg  PO x 1 Daily INR, CBCs F/u s/sx bleeding, PO intake, DDI  Nida Boatman, PharmD PGY1 Acute Care Pharmacy Resident Pager: [phone] 08/30/2017,1:39 PM

## 2017-08-30 NOTE — Progress Notes (Signed)
  Echocardiogram 2D Echocardiogram has been performed.  Timur Nibert T Tremel Setters 08/30/2017, 11:00 AM

## 2017-08-30 NOTE — Plan of Care (Signed)
  Problem: Education: Goal: Knowledge of General Education information will improve Outcome: Progressing   Problem: Health Behavior/Discharge Planning: Goal: Ability to manage health-related needs will improve Outcome: Progressing   Problem: Clinical Measurements: Goal: Ability to maintain clinical measurements within normal limits will improve Outcome: Progressing Goal: Will remain free from infection Outcome: Progressing Goal: Diagnostic test results will improve Outcome: Progressing Goal: Respiratory complications will improve Outcome: Progressing Goal: Cardiovascular complication will be avoided Outcome: Progressing   Problem: Activity: Goal: Risk for activity intolerance will decrease Outcome: Progressing   Problem: Nutrition: Goal: Adequate nutrition will be maintained Outcome: Progressing   Problem: Coping: Goal: Level of anxiety will decrease Outcome: Progressing   Problem: Elimination: Goal: Will not experience complications related to bowel motility Outcome: Progressing Goal: Will not experience complications related to urinary retention Outcome: Progressing   Problem: Pain Managment: Goal: General experience of comfort will improve Outcome: Progressing   Problem: Safety: Goal: Ability to remain free from injury will improve Outcome: Progressing   Problem: Skin Integrity: Goal: Risk for impaired skin integrity will decrease Outcome: Progressing   Problem: Education: Goal: Understanding of cardiac disease, CV risk reduction, and recovery process will improve Outcome: Progressing Goal: Understanding of medication regimen will improve Outcome: Progressing   Problem: Activity: Goal: Ability to tolerate increased activity will improve Outcome: Progressing   Problem: Cardiac: Goal: Ability to achieve and maintain adequate cardiopulmonary perfusion will improve Outcome: Progressing Goal: Vascular access site(s) Level 0-1 will be maintained Outcome:  Progressing   Problem: Health Behavior/Discharge Planning: Goal: Ability to safely manage health-related needs after discharge will improve Outcome: Progressing   

## 2017-08-31 ENCOUNTER — Encounter (HOSPITAL_COMMUNITY): Payer: Self-pay | Admitting: Cardiology

## 2017-08-31 MED ORDER — NITROGLYCERIN 0.4 MG SL SUBL: 0.4000 mg | SUBLINGUAL_TABLET | SUBLINGUAL | 2 refills | Status: AC | PRN

## 2017-08-31 MED ORDER — TICAGRELOR 90 MG PO TABS: 90.0000 mg | ORAL_TABLET | Freq: Two times a day (BID) | ORAL | 2 refills | Status: DC

## 2017-08-31 MED ORDER — ATORVASTATIN CALCIUM 80 MG PO TABS: 80.0000 mg | ORAL_TABLET | Freq: Every day | ORAL | 1 refills | Status: AC

## 2017-08-31 NOTE — Progress Notes (Signed)
Per insurance check on Brilinta  # 5. S/W NORMA @ [employer] [facility] # [phone]    BRILINTA 90 MG BID  COVER- YES  CO-PAY- $ 45.00  TIER- 3 DRUG  PRIOR APPROVAL- NO   PREFERRED PHARMACY : YES - [facility]   CM provided free 30day Brilinta card to pt, pt will get prescription filled at [facility] on [address] in [place] --- CM verified pharmacy can fill prescription at discharge.  Pt is independent from home with wife.  Pt has PCP and denied barriers to obtaining/paying for medications including new copay for Brilinta

## 2017-08-31 NOTE — Progress Notes (Signed)
Progress Note  Patient Name: Steven Oneal Date of Encounter: 08/31/2017  Primary Cardiologist: No primary care provider on file.   Subjective   Feels well. No CP or dyspnea. Has walked on his own this morning.   Inpatient Medications    Scheduled Meds: . amLODipine  5 mg Oral Daily  . aspirin EC  81 mg Oral Daily  . atorvastatin  80 mg Oral q1800  . losartan  50 mg Oral Daily  . metoprolol tartrate  25 mg Oral BID  . pantoprazole  40 mg Oral Daily  . sodium chloride flush  3 mL Intravenous Q12H  . tamsulosin  0.4 mg Oral Daily  . ticagrelor  90 mg Oral BID  . Warfarin - Pharmacist Dosing Inpatient   Does not apply q1800   Continuous Infusions: . sodium chloride     PRN Meds: sodium chloride, acetaminophen, diazepam, ondansetron (ZOFRAN) IV, sodium chloride flush   Vital Signs    Vitals:   08/30/17 1941 08/30/17 2114 08/30/17 2300 08/31/17 0300  BP: 104/62 109/60 96/60 (!) 105/58  Pulse: 89 91 91 94  Resp: (!) 21  (!) 21 20  Temp: 98.6 F (37 C)  98.5 F (36.9 C) 98.5 F (36.9 C)  TempSrc: Oral  Oral Oral  SpO2: 95%  94% 96%  Weight:      Height:        Intake/Output Summary (Last 24 hours) at 08/31/2017 0824 Last data filed at 08/31/2017 0008 Gross per 24 hour  Intake 726 ml  Output 900 ml  Net -174 ml   Filed Weights   08/29/17 1913  Weight: 229 lb 15 oz (104.3 kg)    Telemetry    Normal sinus rhythm  - Personally Reviewed   Physical Exam  Alert, oriented male in NAD GEN: No acute distress.   Neck: No JVD Cardiac: RRR, no murmurs, rubs, or gallops.  Respiratory: Clear to auscultation bilaterally. GI: Soft, nontender, non-distended  MS: No edema; No deformity. Right radial site clear - bandage removed Neuro:  Nonfocal  Psych: Normal affect   Labs    Chemistry Recent Labs  Lab 08/29/17 1819 08/29/17 1942 08/30/17 1514  NA 119* 137 137  K 3.0* 3.7 4.0  CL 83* 104 103  CO2  --  23 24  GLUCOSE 87 133* 228*  BUN 11 9 7     CREATININE 0.30* 0.74 0.77  CALCIUM  --  8.6* 8.6*  PROT  --  5.8*  --   ALBUMIN  --  3.6  --   AST  --  49*  --   ALT  --  55  --   ALKPHOS  --  37*  --   BILITOT  --  0.9  --   GFRNONAA  --  >60 >60  GFRAA  --  >60 >60  ANIONGAP  --  10 10     Hematology Recent Labs  Lab 08/29/17 1819 08/29/17 1942 08/30/17 1514  WBC  --  9.9 7.9  RBC  --  4.71 4.52  HGB 12.6* 14.0 13.6  HCT 37.0* 40.9 39.5  MCV  --  86.8 87.4  MCH  --  29.7 30.1  MCHC  --  34.2 34.4  RDW  --  12.7 12.7  PLT  --  202 190    Cardiac Enzymes Recent Labs  Lab 08/29/17 1942 08/30/17 0106 08/30/17 1514  TROPONINI 0.47* 1.86* 2.45*   No results for input(s): TROPIPOC in the last 168  hours.   BNPNo results for input(s): BNP, PROBNP in the last 168 hours.   DDimer No results for input(s): DDIMER in the last 168 hours.   Radiology    No results found.  Echo: Study Conclusions  - Left ventricle: The cavity size was normal. Wall thickness was   increased in a pattern of mild LVH. Systolic function was normal.   The estimated ejection fraction was in the range of 55% to 60%.   Basal to mid inferior wall hypokinesis. Doppler parameters are   consistent with abnormal left ventricular relaxation (grade 1   diastolic dysfunction). The E/e&' ratio is between 8-15,   suggesting indeterminate LV filling pressure. - Mitral valve: Mildly thickened leaflets . There was mild   regurgitation. - Left atrium: The atrium was normal in size. - Tricuspid valve: There was mild regurgitation. - Pulmonary arteries: PA peak pressure: 28 mm Hg (S). - Inferior vena cava: The vessel was normal in size. The   respirophasic diameter changes were in the normal range (>= 50%),   consistent with normal central venous pressure.  Impressions:  - LVEF 55-60%, mild LVH, basal to mid inferior hypokinesis, grade 1   DD, indeterminate LV fillling pressure, mild MR, normal LA size,   mild TR, RVSP 28 mmHg, normal  IVC.  Patient Profile     72 y.o. male with a history of chronic DVT/PE on coumadin, CAD with placement of 2 Cypher DES in the RCA in 2007, moderate LAD disease, HLD, HTN presenting with an inferior STEMI.   Assessment & Plan    1. STEMI involving the RCA: The patient is stable for discharge today.  Because of his need for chronic oral anticoagulation with warfarin, we have elected to continue him on ticagrelor but stop aspirin.  At 3 months it might be reasonable to change him to clopidogrel to reduce bleeding risk.  He will continue on a high intensity statin and beta-blocker.  Will arrange hospital follow-up.  Echo reviewed and shows normal LV systolic function.  2.  Chronic DVT/pulmonary embolism: Patient back on warfarin and will continue per home dosing  3.  Hypertension: Blood pressure is well controlled and he will continue on his current medications.  4.  Hyperlipidemia: He has been changed from pravastatin to a high intensity statin drug with atorvastatin 80 mg.  Disposition: Home this morning  For questions or updates, please contact Romeo Please consult [email or web address] for contact info under Cardiology/STEMI.      Signed, Sherren Mocha, MD  08/31/2017, 8:24 AM

## 2017-08-31 NOTE — Progress Notes (Signed)
Per insurance check on Brilinta  # 5. S/W NORMA @ [employer] [facility] # [phone]    BRILINTA 90 MG BID  COVER- YES  CO-PAY- $ 45.00  TIER- 3 DRUG  PRIOR APPROVAL- NO   PREFERRED PHARMACY : YES - [facility]

## 2017-08-31 NOTE — Plan of Care (Signed)
Pt being discharged home met all requirements on care plan

## 2017-08-31 NOTE — Progress Notes (Signed)
PT DISCHARGED TO HOME WITH SON VIA W/C WITH BELONGINGS. EXPLAINED AND DISCUSSED DISCHARGE INSTRUCTIONS. NO COMPLAINTS AT THIS TIME.

## 2017-08-31 NOTE — Discharge Instructions (Signed)
Acute Coronary Syndrome °Acute coronary syndrome (ACS) is a serious problem in which there is suddenly not enough blood and oxygen reaching the heart. ACS can result in chest pain or a heart attack. °What are the causes? °This condition may be caused by: °· A buildup of fat and cholesterol inside of the arteries (atherosclerosis). This is the most common cause. The buildup (plaque) can cause the blood vessels in your heart (coronary arteries) to become narrow or blocked. Plaque can also break off to form a clot. °· A coronary spasm. °· A tearing of the coronary artery (spontaneous coronary artery dissection). °· Low blood pressure (hypotension). °· An abnormal heart beat (arrhythmia). °· Using cocaine or methamphetamine. ° °What increases the risk? °The following factors may make you more likely to develop this condition: °· Age. °· History of chest pain, heart attack, or stroke. °· Being male. °· Family history of chest pain, heart disease, or stroke. °· Smoking. °· Inactivity. °· Being overweight. °· High cholesterol. °· High blood pressure (hypertension). °· Diabetes. °· Excessive alcohol use. ° °What are the signs or symptoms? °Common symptoms of this condition include: °· Chest pain. The pain may last long, or may stop and come back (recur). It may feel like: °? Crushing or squeezing. °? Tightness, pressure, fullness, or heaviness. °· Arm, neck, jaw, or back pain. °· Heartburn or indigestion. °· Shortness of breath. °· Nausea. °· Sudden cold sweats. °· Lightheadedness. °· Dizziness. °· Tiredness (fatigue). ° °Sometimes there are no symptoms. °How is this diagnosed? °This condition may be diagnosed through: °· An electrocardiogram (ECG). This test records the impulses of the heart. °· Blood tests. °· A CT scan of the chest. °· A coronary angiogram. This procedure checks for a blockage in the coronary arteries. ° °How is this treated? °Treatment for this condition may include: °· Oxygen. °· Medicines, such  as: °? Antiplatelet medicines and blood-thinning medicines, such as aspirin. These help prevent blood clots. °? Fibrinolytic therapy. This breaks apart a blood clot. °? Blood pressure medicines. °? Nitroglycerin. °? Pain medicine. °? Cholesterol medicine. °· A procedure called coronary angioplasty and stenting. This is done to widen a narrowed artery and keep it open. °· Coronary artery bypass surgery. This allows blood to pass the blockage to reach your heart. °· Cardiac rehabilitation. This is a program that helps improve your health and well-being. It includes exercise training, education, and counseling to help you recover. ° °Follow these instructions at home: °Eating and drinking °· Follow a heart-healthy, low-salt (sodium) diet. °· Use healthy cooking methods such as roasting, grilling, broiling, baking, poaching, steaming, or stir-frying. °· Talk to a dietitian to learn about healthy cooking methods and how to eat less sodium. °Medicines °· Take over-the-counter and prescription medicines only as told by your health care provider. °· Do not take these medicines unless your health care provider approves: °? Nonsteroidal anti-inflammatory drugs (NSAIDs), such as ibuprofen, naproxen, or celecoxib. °? Vitamin supplements that contain vitamin A or vitamin E. °? Hormone replacement therapy that contains estrogen. °Activity °· Join a cardiac rehabilitation program. °· Ask your health care provider: °? What activities and exercises are safe for you. °? If you should follow specific instructions about lifting, driving, or climbing stairs. °· If you are taking aspirin and another blood thinning medicine, avoid activities that are likely to result in an injury. The medicines can increase your risk of bleeding. °Lifestyle °· Do not use any products that contain nicotine or tobacco, such as cigarettes   and e-cigarettes. If you need help quitting, ask your health care provider. °· If you drink alcohol and your health care  provider says it is okay to drink, limit your alcohol intake to no more than 1 drink per day. One drink equals 12 oz of beer, 5 oz of wine, or 1½ oz of hard liquor. °· Maintain a healthy weight. If you need to lose weight, do it in a way that has been approved by your health care provider. °General instructions °· Tell all your health care providers about your heart condition, including your dentist. Some medicines can increase your risk of arrhythmia. °· Manage other health conditions, such as hypertension and diabetes. These conditions affect your heart. °· Learn ways to manage stress. °· Get screened for depression, and seek treatment if needed. °· Monitor your blood pressure if told by your health care provider. °· Keep your vaccinations up to date. Get the annual influenza vaccine. °· Keep all follow-up visits as told by your health care provider. This is important. °Contact a health care provider if: °· You feel overwhelmed or sad. °· You have trouble with your daily activities. °Get help right away if: °· You have pain in your chest, neck, arm, jaw, stomach, or back that recurs, and: °? Lasts more than a few minutes. °? Is not relieved by taking the medicineyour health care provider prescribed. °· You have unexplained: °? Heavy sweating. °? Heartburn or indigestion. °? Shortness of breath. °? Difficulty breathing. °? Nausea or vomiting. °? Fatigue. °? Nervousness or anxiety. °? Weakness. °? Diarrhea. °? Dark stools or blood in the stool. °· You have sudden lightheadedness or dizziness. °· Your blood pressure is higher than 180/120 °· You faint. °· You feel like hurting yourself or think about taking your own life. °These symptoms may represent a serious problem that is an emergency. Do not wait to see if the symptoms will go away. Get medical help right away. Call your local emergency services (911 in the [place] Do not drive yourself to the clinic or hospital. °Summary °· Acute coronary syndrome (ACS) is a  when there is not enough blood and oxygen being supplied to the heart. ACS can result in chest pain or a heart attack. °· Acute coronary syndrome is a medical emergency. If you have any symptoms of this condition, get help right away. °· Treatment includes oxygen, medicines, and procedures to open the blocked arteries and restore blood flow. °This information is not intended to replace advice given to you by your health care provider. Make sure you discuss any questions you have with your health care provider. °Document Released: 04/17/2005 Document Revised: 05/19/2016 Document Reviewed: 05/19/2016 °Elsevier Interactive Patient Education © 2018 [employer] Inc[employer] ° °

## 2017-08-31 NOTE — Discharge Summary (Signed)
Discharge Summary    Patient ID: Steven Oneal,  MRN: [id number], DOB/AGE: 05-03-1945 72 y.o.  Admit date: 08/29/2017 Discharge date: 08/31/2017  Primary Care Provider: [employer] Primary Cardiologist: Dr. [name]   Discharge Diagnoses    Active Problems:   Acute ST elevation myocardial infarction (STEMI) of inferior wall (HCC)   History of DVT (deep vein thrombosis)   CAD-s/p PCI   Essential hypertension   Dyslipidemia   Acute ST elevation myocardial infarction (STEMI) involving right coronary artery (HCC)   Allergies Allergies  Allergen Reactions  . Codeine Hives, Itching and Nausea Only  . Niaspan [Niacin Er] Other (See Comments)    MAKES SKIN FLUSH EXCESSIVELY    Diagnostic Studies/Procedures    Cath: 08/29/17  Conclusion     Ost RCA to Mid RCA lesion is 30% stenosed.  Mid RCA lesion is 99% stenosed.  Ost 1st Mrg lesion is 20% stenosed.  Prox Cx to Mid Cx lesion is 20% stenosed.  Ost LAD to Prox LAD lesion is 30% stenosed.  Prox LAD to Mid LAD lesion is 20% stenosed.  Mid LAD to Dist LAD lesion is 20% stenosed.  A drug-eluting stent was successfully placed using a STENT SYNERGY DES 2.75X20.  Post intervention, there is a 0% residual stenosis.   1. Acute inferior STEMI 2. Sub-total occlusion of the mid RCA 3. Successful PTCA/DES x 1 mid RCA 4. Mild non-obstructive disease in the LAD and circumflex.   Recommendations: Will admit to [facility] ICU. Will continue DAPT with ASA and Brilinta for one month and then will stop ASA. (He is also on chronic coumadin. Plan would be to continue Brilinta along with coumadin for one year. ). He will be restarted on coumadin tomorrow. INR will be arranged now. If his INR is below 2.0, can consider IV heparin but this should not start before the sheath has been out of his radial artery for 8 hours. He is on coumadin for h/o DVT and PE. Will continue statin, beta blocker. Echo tomorrow.    TTE: 08/30/17  Study  Conclusions  - Left ventricle: The cavity size was normal. Wall thickness was   increased in a pattern of mild LVH. Systolic function was normal.   The estimated ejection fraction was in the range of 55% to 60%.   Basal to mid inferior wall hypokinesis. Doppler parameters are   consistent with abnormal left ventricular relaxation (grade 1   diastolic dysfunction). The E/e&' ratio is between 8-15,   suggesting indeterminate LV filling pressure. - Mitral valve: Mildly thickened leaflets . There was mild   regurgitation. - Left atrium: The atrium was normal in size. - Tricuspid valve: There was mild regurgitation. - Pulmonary arteries: PA peak pressure: 28 mm Hg (S). - Inferior vena cava: The vessel was normal in size. The   respirophasic diameter changes were in the normal range (>= 50%),   consistent with normal central venous pressure.  Impressions:  - LVEF 55-60%, mild LVH, basal to mid inferior hypokinesis, grade 1   DD, indeterminate LV fillling pressure, mild MR, normal LA size,   mild TR, RVSP 28 mmHg, normal IVC. _____________   History of Present Illness     72 y.o. male with a history of chronic DVT/PE on coumadin, CAD with placement of 2 Cypher DES in the RCA in 2007, moderate LAD disease, HLD, HTN presenting with an inferior STEMI. Remote stenting of the ostial and mid RCA in 2007. No cath since 2008. He  is on coumadin for chronic DVT and PE.   Chest pain started around 3 pm the evening of admission. He called EMS. EKG with inferior ST elevation. Code STEMI called. Pt c/o 6/10 chest pain upon arrival to [facility] ED with radiation into his right arm. Associated dyspnea. EMS administered 3 SL NTG, ASA 324 mg and morphine 4 mg. Pt taken to cath lab for emergent cath and PCI.   Hospital Course     Underwent cath noted above with sub-total occlusion of the mRCA with successful PCI/DES x1. Initial plan was for DAPT with ASA/Brilinta but given his need for Coumadin, will plan for  monotherapy with Brilinta in conjunction with coumadin. Follow up echo showed normal EF with basal to mid hypokinesis and G1DD. No recurrent chest pain. Worked well with cardiac rehab. Per Dr. Burt Knack could consider switching to clopidogrel at 3 months to reduce bleeding risk.   Silvano Bilis was seen by Dr. Burt Knack and determined stable for discharge home. Follow up in the office has been arranged. Medications are listed below.   _____________  Discharge Vitals Blood pressure 120/74, pulse 99, temperature 98.7 F (37.1 C), temperature source Oral, resp. rate 20, height 6' (1.829 m), weight 229 lb 15 oz (104.3 kg), SpO2 96 %.  Filed Weights   08/29/17 1913  Weight: 229 lb 15 oz (104.3 kg)    Labs & Radiologic Studies    CBC Recent Labs    08/29/17 1942 08/30/17 1514  WBC 9.9 7.9  HGB 14.0 13.6  HCT 40.9 39.5  MCV 86.8 87.4  PLT 202 423   Basic Metabolic Panel Recent Labs    08/29/17 1942 08/30/17 1514  NA 137 137  K 3.7 4.0  CL 104 103  CO2 23 24  GLUCOSE 133* 228*  BUN 9 7  CREATININE 0.74 0.77  CALCIUM 8.6* 8.6*   Liver Function Tests Recent Labs    08/29/17 1942  AST 49*  ALT 55  ALKPHOS 37*  BILITOT 0.9  PROT 5.8*  ALBUMIN 3.6   No results for input(s): LIPASE, AMYLASE in the last 72 hours. Cardiac Enzymes Recent Labs    08/29/17 1942 08/30/17 0106 08/30/17 1514  TROPONINI 0.47* 1.86* 2.45*   BNP Invalid input(s): POCBNP D-Dimer No results for input(s): DDIMER in the last 72 hours. Hemoglobin A1C No results for input(s): HGBA1C in the last 72 hours. Fasting Lipid Panel No results for input(s): CHOL, HDL, LDLCALC, TRIG, CHOLHDL, LDLDIRECT in the last 72 hours. Thyroid Function Tests No results for input(s): TSH, T4TOTAL, T3FREE, THYROIDAB in the last 72 hours.  Invalid input(s): FREET3 _____________  No results found. Disposition   Pt is being discharged home today in good condition.  Follow-up Plans & Appointments    Follow-up  Information    [employer] [employer], MD Follow up on 09/03/2017.   Specialty:  Internal Medicine Why:  Please keep your appt to have your INR checked on Monday. Contact information: [address] [place] [address] [phone]        Erlene Quan, PA-C Follow up on 09/20/2017.   Specialties:  Cardiology, Radiology Why:  at 8:30am for your follow up appt  Contact information: [address] [place] [address] [phone]          Discharge Instructions    Amb Referral to Cardiac Rehabilitation   Complete by:  As directed    Diagnosis:   STEMI Coronary Stents     Call MD for:  redness, tenderness,  or signs of infection (pain, swelling, redness, odor or green/yellow discharge around incision site)   Complete by:  As directed    Diet - low sodium heart healthy   Complete by:  As directed    Discharge instructions   Complete by:  As directed    Radial Site Care Refer to this sheet in the next few weeks. These instructions provide you with information on caring for yourself after your procedure. Your caregiver may also give you more specific instructions. Your treatment has been planned according to current medical practices, but problems sometimes occur. Call your caregiver if you have any problems or questions after your procedure. HOME CARE INSTRUCTIONS You may shower the day after the procedure.Remove the bandage (dressing) and gently wash the site with plain soap and water.Gently pat the site dry.  Do not apply powder or lotion to the site.  Do not submerge the affected site in water for 3 to 5 days.  Inspect the site at least twice daily.  Do not flex or bend the affected arm for 24 hours.  No lifting over 5 pounds (2.3 kg) for 5 days after your procedure.  Do not drive home if you are discharged the same day of the procedure. Have someone else drive you.  You may drive 24 hours after the procedure unless otherwise instructed by your caregiver.    What to expect: Any bruising will usually fade within 1 to 2 weeks.  Blood that collects in the tissue (hematoma) may be painful to the touch. It should usually decrease in size and tenderness within 1 to 2 weeks.  SEEK IMMEDIATE MEDICAL CARE IF: You have unusual pain at the radial site.  You have redness, warmth, swelling, or pain at the radial site.  You have drainage (other than a small amount of blood on the dressing).  You have chills.  You have a fever or persistent symptoms for more than 72 hours.  You have a fever and your symptoms suddenly get worse.  Your arm becomes pale, cool, tingly, or numb.  You have heavy bleeding from the site. Hold pressure on the site.   PLEASE DO NOT MISS ANY DOSES OF YOUR BRILINTA!!!!! Also keep a log of you blood pressures and bring back to your follow up appt. Please call the office with any questions.   Patients taking blood thinners should generally stay away from medicines like ibuprofen, Advil, Motrin, naproxen, and Aleve due to risk of stomach bleeding. You may take Tylenol as directed or talk to your primary doctor about alternatives.   Increase activity slowly   Complete by:  As directed       Discharge Medications     Medication List    STOP taking these medications   aspirin EC 81 MG tablet   pravastatin 80 MG tablet Commonly known as:  PRAVACHOL     TAKE these medications   Alpha Lipoic Acid 200 MG Caps Take 1 capsule by mouth daily.   amLODipine 5 MG tablet Commonly known as:  NORVASC TAKE ONE TABLET BY MOUTH ONCE DAILY   atorvastatin 80 MG tablet Commonly known as:  LIPITOR Take 1 tablet (80 mg total) by mouth daily at 6 PM.   diazepam 10 MG tablet Commonly known as:  VALIUM Take 10 mg by mouth at bedtime as needed for anxiety or sleep.   fish oil-omega-3 fatty acids 1000 MG capsule Take 1 g by mouth 3 (three) times daily.   levothyroxine 25 MCG  tablet Commonly known as:  SYNTHROID, LEVOTHROID Take 25 mcg by  mouth daily.   losartan 50 MG tablet Commonly known as:  COZAAR Take 1 tablet (50 mg total) by mouth daily.   meclizine 25 MG tablet Commonly known as:  ANTIVERT Take 25 mg by mouth as needed for dizziness (FOR INNER EAR).   metoprolol tartrate 25 MG tablet Commonly known as:  LOPRESSOR Take 1 tablet (25 mg total) 2 (two) times daily by mouth.   multivitamin with minerals Tabs tablet Take 1 tablet by mouth daily.   nitroGLYCERIN 0.4 MG SL tablet Commonly known as:  NITROSTAT Place 1 tablet (0.4 mg total) under the tongue every 5 (five) minutes as needed.   omeprazole 20 MG capsule Commonly known as:  PRILOSEC Take 20 mg by mouth every morning.   tamsulosin 0.4 MG Caps capsule Commonly known as:  FLOMAX Take 1 capsule (0.4 mg total) by mouth daily.   ticagrelor 90 MG Tabs tablet Commonly known as:  BRILINTA Take 1 tablet (90 mg total) by mouth 2 (two) times daily.   warfarin 5 MG tablet Commonly known as:  COUMADIN Take 5 mg by mouth daily.        Aspirin prescribed at discharge?  No, on coumadin and Brilinta (monotherapy) High Intensity Statin Prescribed? (Lipitor 40-80mg  or Crestor 20-40mg ): Yes Beta Blocker Prescribed? Yes For EF <40%, was ACEI/ARB Prescribed? Yes ADP Receptor Inhibitor Prescribed? (i.e. Plavix etc.-Includes Medically Managed Patients): Yes For EF <40%, Aldosterone Inhibitor Prescribed? No: EF ok Was EF assessed during THIS hospitalization? Yes Was Cardiac Rehab II ordered? (Included Medically managed Patients): Yes   Outstanding Labs/Studies   FLP/LFTs in 6 weeks.   Duration of Discharge Encounter   Greater than 30 minutes including physician time.  Signed, Reino Bellis NP-C 08/31/2017, 8:42 AM

## 2017-09-03 DIAGNOSIS — Z7901 Long term (current) use of anticoagulants: Secondary | ICD-10-CM | POA: Diagnosis not present

## 2017-09-03 NOTE — Consult Note (Signed)
            [facility] CM Primary Care Navigator  09/03/2017  Steven Oneal [birth year] [id number]   Attempt to seepatient at the bedside to identify possible discharge needsbuthe was already dischargedhome.  Patient was seen and treated for acute inferior ST elevated myocardial infarction.  Primary care provider's officeis listed as providingtransition of care (TOC)follow-up.   Patient has discharge instruction to follow-up with primary care provider on 09/03/17 and cardiology follow-up on 09/20/17.   For additional questions please contact:  Edwena Felty A. Haili Donofrio, BSN, RN-BC [facility] PRIMARY CARE Navigator Cell: [phone]

## 2017-09-05 DIAGNOSIS — F419 Anxiety disorder, unspecified: Secondary | ICD-10-CM | POA: Diagnosis not present

## 2017-09-05 DIAGNOSIS — Z6833 Body mass index (BMI) 33.0-33.9, adult: Secondary | ICD-10-CM | POA: Diagnosis not present

## 2017-09-05 DIAGNOSIS — E782 Mixed hyperlipidemia: Secondary | ICD-10-CM | POA: Diagnosis not present

## 2017-09-05 DIAGNOSIS — E6609 Other obesity due to excess calories: Secondary | ICD-10-CM | POA: Diagnosis not present

## 2017-09-05 DIAGNOSIS — I10 Essential (primary) hypertension: Secondary | ICD-10-CM | POA: Diagnosis not present

## 2017-09-05 DIAGNOSIS — Z1389 Encounter for screening for other disorder: Secondary | ICD-10-CM | POA: Diagnosis not present

## 2017-09-10 DIAGNOSIS — Z7901 Long term (current) use of anticoagulants: Secondary | ICD-10-CM | POA: Diagnosis not present

## 2017-09-17 ENCOUNTER — Telehealth: Payer: Self-pay | Admitting: *Deleted

## 2017-09-17 NOTE — Telephone Encounter (Signed)
Kathlee Nations from [facility] called stating that the patient would like to have his coumadin checked in [place] from now on. She stated that it is currently monitored by his PCP. The nurse was visiting the patient's wife and the wife stated that she wanted to switch therefore the patient would like to as well.  Message has been left with the pharmacist at the [place] office to call back concerning the switch.

## 2017-09-19 NOTE — Telephone Encounter (Signed)
Spoke with Lattie Haw from [place] office who confirmed that it would be ok for pt to switch to their office for her coumadin check and have scheduler add him on as new patient. Staff message sent to office to schedule appointment.   Kathlee Nations from [facility] as well as pt's updated and verbalized understanding.

## 2017-09-20 ENCOUNTER — Encounter: Payer: Self-pay | Admitting: Cardiology

## 2017-09-20 ENCOUNTER — Ambulatory Visit (INDEPENDENT_AMBULATORY_CARE_PROVIDER_SITE_OTHER): Payer: Medicare HMO | Admitting: Cardiology

## 2017-09-20 VITALS — BP 110/62 | HR 68 | Ht 72.0 in | Wt 232.6 lb

## 2017-09-20 DIAGNOSIS — Z9861 Coronary angioplasty status: Secondary | ICD-10-CM

## 2017-09-20 DIAGNOSIS — I10 Essential (primary) hypertension: Secondary | ICD-10-CM | POA: Diagnosis not present

## 2017-09-20 DIAGNOSIS — Z86711 Personal history of pulmonary embolism: Secondary | ICD-10-CM

## 2017-09-20 DIAGNOSIS — Z86718 Personal history of other venous thrombosis and embolism: Secondary | ICD-10-CM | POA: Diagnosis not present

## 2017-09-20 DIAGNOSIS — E785 Hyperlipidemia, unspecified: Secondary | ICD-10-CM

## 2017-09-20 DIAGNOSIS — I2510 Atherosclerotic heart disease of native coronary artery without angina pectoris: Secondary | ICD-10-CM | POA: Diagnosis not present

## 2017-09-20 DIAGNOSIS — I2111 ST elevation (STEMI) myocardial infarction involving right coronary artery: Secondary | ICD-10-CM | POA: Diagnosis not present

## 2017-09-20 DIAGNOSIS — Z7901 Long term (current) use of anticoagulants: Secondary | ICD-10-CM | POA: Diagnosis not present

## 2017-09-20 NOTE — Patient Instructions (Addendum)
Continue same medications    Keep appointment with Dr.Berry Tuesday 10/23/17 at 10:15 am

## 2017-09-20 NOTE — Progress Notes (Signed)
09/20/2017 Steven Oneal   05-Feb-1946  [id number]  Primary Physician Redmond School, MD Primary Cardiologist: Dr [name]  HPI:  Pleasant 72 y/o male with a history of CAD, s/p RCA PCI in '07. Re look cath in '08 showed a patent stent with moderate left disease. Other problems include a history of DVT in '14 and PE in '16. He is on chronic Warfarin. He has HTN, HLD, and hypothyroidism. He was in his usual state of health on 08/29/17, returning from [facility], when he developed severe SSCP. EMS was called to his home and code STEMI called. In the cath lab he had a new mRCA 99% stenosis, the prior pRCA stent site was patent and he had no significant left system disease. He underwent successful RCA PCI with DES. His LVF is normal by echo 08/30/17. It was decided to discharge him on Brilinta and Warfarin (no ASA), with plans to change to Brilinta to Plavix in 3 months. He is in the office today for follow up. He is doing well, "a little weak". No chest pain. He is tolerating Lipitor 80 mg (he was changed from Pravachol to Lipitor at discharge).    Current Outpatient Medications  Medication Sig Dispense Refill  . Alpha Lipoic Acid 200 MG CAPS Take 1 capsule by mouth daily.    Marland Kitchen amLODipine (NORVASC) 5 MG tablet TAKE ONE TABLET BY MOUTH ONCE DAILY 90 tablet 3  . atorvastatin (LIPITOR) 80 MG tablet Take 1 tablet (80 mg total) by mouth daily at 6 PM. 90 tablet 1  . diazepam (VALIUM) 10 MG tablet Take 10 mg by mouth at bedtime as needed for anxiety or sleep.     . fish oil-omega-3 fatty acids 1000 MG capsule Take 1 g by mouth 3 (three) times daily.     Marland Kitchen levothyroxine (SYNTHROID, LEVOTHROID) 25 MCG tablet Take 25 mcg by mouth daily.  3  . losartan (COZAAR) 50 MG tablet Take 1 tablet (50 mg total) by mouth daily. 90 tablet 2  . meclizine (ANTIVERT) 25 MG tablet Take 25 mg by mouth as needed for dizziness (FOR INNER EAR).     . metoprolol tartrate (LOPRESSOR) 25 MG tablet Take 1 tablet (25 mg total) 2 (two)  times daily by mouth. 60 tablet 6  . Multiple Vitamin (MULTIVITAMIN WITH MINERALS) TABS tablet Take 1 tablet by mouth daily.    . nitroGLYCERIN (NITROSTAT) 0.4 MG SL tablet Place 1 tablet (0.4 mg total) under the tongue every 5 (five) minutes as needed. 25 tablet 2  . omeprazole (PRILOSEC) 20 MG capsule Take 20 mg by mouth every morning.     . tamsulosin (FLOMAX) 0.4 MG CAPS capsule Take 1 capsule (0.4 mg total) by mouth daily. 30 capsule 1  . ticagrelor (BRILINTA) 90 MG TABS tablet Take 1 tablet (90 mg total) by mouth 2 (two) times daily. 180 tablet 2  . warfarin (COUMADIN) 5 MG tablet Take 5 mg by mouth daily.     No current facility-administered medications for this visit.     Allergies  Allergen Reactions  . Codeine Hives, Itching and Nausea Only  . Niaspan [Niacin Er] Other (See Comments)    MAKES SKIN FLUSH EXCESSIVELY    Past Medical History:  Diagnosis Date  . Allergic rhinitis   . Anticoagulated on Coumadin   . CAD (coronary artery disease) cardiologist-  dr berry   DES Cypher x 2 mid & ostial RCA 2007; Repeat LHC 06/2014 angiographically minimal CAD with widely patent RCA  stents EF 60-65% 08/29/17 STEMI with PCI/DES x1 to mRCA  . Dyslipidemia   . ED (erectile dysfunction)    vascular  . Essential hypertension   . First degree heart block   . GERD (gastroesophageal reflux disease)   . Hiatal hernia   . History of adenomatous polyp of colon    tubular adenoma 08-10-2010  . History of DVT (deep vein thrombosis) 11/08/2012   LLE  . History of pulmonary embolus (PE) 11/08/2012   LLL  . History of ST elevation myocardial infarction (STEMI) 06/15/2005   inferior wall stemi-- s/p  pci and des x2 to rca  . Nocturia   . PAC (premature atrial contraction)   . S/P drug eluting coronary stent placement 06/15/2005   to mid and ostial RCA  . Vertigo   . Wears dentures    upper denture and lower partial    Social History   Socioeconomic History  . Marital status: Married     Spouse name: Not on file  . Number of children: 2  . Years of education: Not on file  . Highest education level: Not on file  Occupational History  . Occupation: disablity    Comment: CAD  Social Needs  . Financial resource strain: Not on file  . Food insecurity:    Worry: Not on file    Inability: Not on file  . Transportation needs:    Medical: Not on file    Non-medical: Not on file  Tobacco Use  . Smoking status: Former Smoker    Packs/day: 0.10    Years: 13.00    Pack years: 1.30    Types: Cigarettes    Last attempt to quit: 05/27/1977    Years since quitting: 40.3  . Smokeless tobacco: Former Systems developer    Quit date: 09/08/1968  Substance and Sexual Activity  . Alcohol use: No  . Drug use: No  . Sexual activity: Yes    Partners: Female    Birth control/protection: None  Lifestyle  . Physical activity:    Days per week: Not on file    Minutes per session: Not on file  . Stress: Not on file  Relationships  . Social connections:    Talks on phone: Not on file    Gets together: Not on file    Attends religious service: Not on file    Active member of club or organization: Not on file    Attends meetings of clubs or organizations: Not on file    Relationship status: Not on file  . Intimate partner violence:    Fear of current or ex partner: Not on file    Emotionally abused: Not on file    Physically abused: Not on file    Forced sexual activity: Not on file  Other Topics Concern  . Not on file  Social History Narrative  . Not on file     Family History  Problem Relation Age of Onset  . Colon cancer Mother 37  . Colon cancer Brother 61       Rectal cancer  . Pulmonary embolism Brother        Cancer  . Colon cancer Paternal Uncle 69  . Colon cancer Paternal Uncle 103  . Throat cancer Brother 88  . Prostate cancer Brother      Review of Systems: General: negative for chills, fever, night sweats or weight changes.  Cardiovascular: negative for chest pain,  dyspnea on exertion, edema, orthopnea, palpitations, paroxysmal nocturnal dyspnea  or shortness of breath Dermatological: negative for rash Respiratory: negative for cough or wheezing Urologic: negative for hematuria Abdominal: negative for nausea, vomiting, diarrhea, bright red blood per rectum, melena, or hematemesis Neurologic: negative for visual changes, syncope, or dizziness All other systems reviewed and are otherwise negative except as noted above.    Blood pressure 110/62, pulse 68, height 6' (1.829 m), weight 232 lb 9.6 oz (105.5 kg).  General appearance: alert, cooperative, no distress and mildly obese Neck: no carotid bruit and no JVD Lungs: clear to auscultation bilaterally Heart: regular rate and rhythm Extremities: extremities normal, atraumatic, no cyanosis or edema Skin: Skin color, texture, turgor normal. No rashes or lesions Neurologic: Grossly normal  EKG NSR, 1st degree AVB, one PVC-rate 68  ASSESSMENT AND PLAN:   Acute ST elevation myocardial infarction (STEMI) involving right coronary artery [facility]) Presented with inferior STEMI 08/29/17- new mRCA 99% stenosis  CAD-s/p PCI  RCA PCI in 2007;  cath 06/2014 w/ minimal CAD w/ widely patent RCA stents Cath 08/29/17 in setting of STEMI showed new mRCA 99% stenosis, prior proximal RCA stent was patent. NL LVF and no other significant CAD (20% LAD)  Chronic anticoagulation for PE, DVT Coumadin Rx- Brilinta alone (no ASA) for 3 months-then change to Plavix post PCI 08/29/17  Dyslipidemia Changed to high dose statin 08/29/17  Essential hypertension controlled  History of DVT (deep vein thrombosis) Lt popliteal DVT July 2014  History of pulmonary embolus (PE) Lt LL PE by CTA July 2014- not seen on CT in 2016   PLAN  Check lipids and LFTs and change Brilinta to Plavix Aug 1st. The pt has a follow up with Dr [name] in June and will keep that.   [name] PA-C 09/20/2017 8:37 AM

## 2017-09-20 NOTE — Assessment & Plan Note (Signed)
Presented with inferior STEMI 08/29/17- new mRCA 99% stenosis

## 2017-09-20 NOTE — Assessment & Plan Note (Signed)
Lt LL PE by CTA July 2014- not seen on CT in 2016

## 2017-09-20 NOTE — Assessment & Plan Note (Signed)
Lt popliteal DVT July 2014

## 2017-09-20 NOTE — Assessment & Plan Note (Signed)
controlled 

## 2017-09-20 NOTE — Assessment & Plan Note (Signed)
Changed to high dose statin 08/29/17

## 2017-09-20 NOTE — Assessment & Plan Note (Signed)
RCA PCI in 2007;  cath 06/2014 w/ minimal CAD w/ widely patent RCA stents Cath 08/29/17 in setting of STEMI showed new mRCA 99% stenosis, prior proximal RCA stent was patent. NL LVF and no other significant CAD (20% LAD)

## 2017-09-20 NOTE — Assessment & Plan Note (Signed)
Coumadin Rx- Brilinta alone (no ASA) for 3 months-then change to Plavix post PCI 08/29/17

## 2017-09-28 ENCOUNTER — Ambulatory Visit (INDEPENDENT_AMBULATORY_CARE_PROVIDER_SITE_OTHER): Payer: Medicare HMO | Admitting: *Deleted

## 2017-09-28 DIAGNOSIS — Z86718 Personal history of other venous thrombosis and embolism: Secondary | ICD-10-CM | POA: Diagnosis not present

## 2017-09-28 DIAGNOSIS — Z86711 Personal history of pulmonary embolism: Secondary | ICD-10-CM | POA: Diagnosis not present

## 2017-09-28 DIAGNOSIS — Z5181 Encounter for therapeutic drug level monitoring: Secondary | ICD-10-CM

## 2017-09-28 LAB — POCT INR: INR: 2.3 (ref 2.0–3.0)

## 2017-09-28 NOTE — Addendum Note (Signed)
Addended by: Malen Gauze on: 09/28/2017 11:31 AM   Modules accepted: Orders

## 2017-09-28 NOTE — Patient Instructions (Signed)
Last INR at [facility] wk of 5/13 for 4.6.  Pt has been on this dose about a year.  INR's fluctuate from 1.9 - 4.6.  No recent medication changes. Continue coumadin 4mg  daily.  Recheck INR in 2.5 weeks

## 2017-10-01 ENCOUNTER — Other Ambulatory Visit: Payer: Self-pay | Admitting: Cardiovascular Disease

## 2017-10-02 NOTE — Telephone Encounter (Signed)
Rx sent to pharmacy   

## 2017-10-10 ENCOUNTER — Ambulatory Visit (INDEPENDENT_AMBULATORY_CARE_PROVIDER_SITE_OTHER): Payer: Medicare HMO | Admitting: *Deleted

## 2017-10-10 DIAGNOSIS — Z5181 Encounter for therapeutic drug level monitoring: Secondary | ICD-10-CM

## 2017-10-10 DIAGNOSIS — Z86711 Personal history of pulmonary embolism: Secondary | ICD-10-CM

## 2017-10-10 DIAGNOSIS — Z86718 Personal history of other venous thrombosis and embolism: Secondary | ICD-10-CM

## 2017-10-10 LAB — POCT INR: INR: 2.3 (ref 2.0–3.0)

## 2017-10-10 NOTE — Patient Instructions (Signed)
Continue coumadin 4mg  daily.  Recheck INR in 2.5 weeks

## 2017-10-23 ENCOUNTER — Encounter: Payer: Self-pay | Admitting: Cardiovascular Disease

## 2017-10-23 ENCOUNTER — Ambulatory Visit (INDEPENDENT_AMBULATORY_CARE_PROVIDER_SITE_OTHER): Payer: Medicare HMO | Admitting: Cardiovascular Disease

## 2017-10-23 VITALS — BP 126/64 | HR 69 | Ht 72.0 in | Wt 232.0 lb

## 2017-10-23 DIAGNOSIS — I10 Essential (primary) hypertension: Secondary | ICD-10-CM | POA: Diagnosis not present

## 2017-10-23 DIAGNOSIS — Z86718 Personal history of other venous thrombosis and embolism: Secondary | ICD-10-CM | POA: Diagnosis not present

## 2017-10-23 DIAGNOSIS — I2510 Atherosclerotic heart disease of native coronary artery without angina pectoris: Secondary | ICD-10-CM | POA: Diagnosis not present

## 2017-10-23 DIAGNOSIS — E785 Hyperlipidemia, unspecified: Secondary | ICD-10-CM | POA: Diagnosis not present

## 2017-10-23 DIAGNOSIS — Z9861 Coronary angioplasty status: Secondary | ICD-10-CM

## 2017-10-23 MED ORDER — CLOPIDOGREL BISULFATE 75 MG PO TABS: 75.0000 mg | ORAL_TABLET | Freq: Every day | ORAL | 3 refills | Status: DC

## 2017-10-23 NOTE — Patient Instructions (Signed)
Medication Instructions:   AUGUST 1ST STOP BRILINTA AND START PLAVIX 75 MG ONCE DAILY  Labwork:  Your physician recommends that you HAVE LAB WORK TODAY  Follow-Up:  Your physician recommends that you schedule a follow-up appointment in: [place]  Your physician wants you to follow-up in: [place] will receive a reminder letter in the mail two months in advance. If you don't receive a letter, please call our office to schedule the follow-up appointment.    If you need a refill on your cardiac medications before your next appointment, please call your pharmacy.

## 2017-10-23 NOTE — Assessment & Plan Note (Signed)
History of DVT and PE on chronic Coumadin anticoagulation.

## 2017-10-23 NOTE — Assessment & Plan Note (Signed)
History of essential hypertension her blood pressure measured today at 126/64.  He is on amlodipine, losartan and metoprolol.  Continue current meds at current dosing.

## 2017-10-23 NOTE — Assessment & Plan Note (Signed)
History of dyslipidemia on statin therapy.  We will recheck a lipid and liver profile. 

## 2017-10-23 NOTE — Assessment & Plan Note (Signed)
History of CAD status post inferior STEMI February 2007 treated with stenting by myself at that time.  He had noncritical disease in his left system with normal LV function.  He developed chest pain on 08/29/2017 and was brought emergently to the Cath Lab where Dr. Angelena Form demonstrated a patent proximal stent with 90% stenosis at the "January" of the RCA which he stented with a 2.75 x 20 mm long Synergy drug-eluting stent postdilated to 3 mm.  He was discharged on Brilinta and Coumadin.  Aspirin was discontinued.  The plan was to transition from Brilinta to Plavix in 3 months which would be August 1.

## 2017-10-23 NOTE — Progress Notes (Signed)
10/23/2017 Steven Oneal   08/13/1945  [id number]  Primary Physician Redmond School, MD Primary Cardiologist: Lorretta Harp MD FACP, [place], [place], [place]  HPI:  Steven Oneal is a 72 y.o.  who has I last saw in the office  10/20/2016. He has a history of CAD status post PCI and stenting of his RCA by Dr [name] Feb 2007 in the setting of an MI. He was restudied in June 2008, and had patent stents in his proximal RCA and noncritical disease in his proximal LAD with normal LV function. He has not had a Myoview since and has done well from a cardiac standpoint. He did have a PE/DVT in July 2014 and is on chronic Coumadin. He recently saw [name] after he had palpitations and chest pain Thanksgiving night. The pt says his heart was racing, he was sweaty, and he had pain across his chest. He told me "thought I was having a heart attack". [name] increased his Coreg and the pt stopped his Niaspan suspecting this had something to do with his symptoms, he had been on this since 2008. Since he stopped the Niaspan he has done well. He denies any further chest pain or palpations. His Holter showed PACs, PVCs, and sinus arrythmia with transient bradycardia and sinus pauses up to 2.7 seconds. A Myoview stress test performed 05/08/13 showed inferobasal scar with mild inferolateral hypokinesis unchanged from prior functional studies. Since I saw him last he has remained asymptomatic. He had a uncomplicated penile Implant performed by Dr. Jeffie Pollock . He developed chest pain on 08/29/2017 and was brought emergently to the [facility] where Dr. Angelena Form demonstrated a patent proximal RCA stent, 99% distal RCA at the Jan you and essentially normal left system.  He placed a synergy 2.75 x 20 mm long drug-eluting stent in the distal RCA post dilating with a 3 mm balloon.  He was discharged home on Brilinta and Coumadin with the intent to transition to Plavix in 3 months.  Since being at home he has been  asymptomatic and denies chest pain or shortness of breath..     Current Meds  Medication Sig  . Alpha Lipoic Acid 200 MG CAPS Take 1 capsule by mouth daily.  Marland Kitchen amLODipine (NORVASC) 5 MG tablet TAKE ONE TABLET BY MOUTH ONCE DAILY  . atorvastatin (LIPITOR) 80 MG tablet Take 1 tablet (80 mg total) by mouth daily at 6 PM.  . diazepam (VALIUM) 10 MG tablet Take 10 mg by mouth at bedtime as needed for anxiety or sleep.   . fish oil-omega-3 fatty acids 1000 MG capsule Take 1 g by mouth 3 (three) times daily.   Marland Kitchen levothyroxine (SYNTHROID, LEVOTHROID) 25 MCG tablet Take 25 mcg by mouth daily.  Marland Kitchen losartan (COZAAR) 50 MG tablet TAKE 1 TABLET(50 MG) BY MOUTH DAILY  . meclizine (ANTIVERT) 25 MG tablet Take 25 mg by mouth as needed for dizziness (FOR INNER EAR).   . metoprolol tartrate (LOPRESSOR) 25 MG tablet TAKE 1 TABLET BY MOUTH TWICE DAILY  . Multiple Vitamin (MULTIVITAMIN WITH MINERALS) TABS tablet Take 1 tablet by mouth daily.  . nitroGLYCERIN (NITROSTAT) 0.4 MG SL tablet Place 1 tablet (0.4 mg total) under the tongue every 5 (five) minutes as needed.  Marland Kitchen omeprazole (PRILOSEC) 20 MG capsule Take 20 mg by mouth every morning.   . tamsulosin (FLOMAX) 0.4 MG CAPS capsule Take 1 capsule (0.4 mg total) by mouth daily.  . ticagrelor (BRILINTA) 90 MG  TABS tablet Take 1 tablet (90 mg total) by mouth 2 (two) times daily.  Marland Kitchen warfarin (COUMADIN) 1 MG tablet Take 1 mg by mouth daily.  Marland Kitchen warfarin (COUMADIN) 3 MG tablet Take 3 mg by mouth daily.     Allergies  Allergen Reactions  . Codeine Hives, Itching and Nausea Only  . Niaspan [Niacin Er] Other (See Comments)    MAKES SKIN FLUSH EXCESSIVELY    Social History   Socioeconomic History  . Marital status: Married    Spouse name: Not on file  . Number of children: 2  . Years of education: Not on file  . Highest education level: Not on file  Occupational History  . Occupation: disablity    Comment: CAD  Social Needs  . Financial resource strain:  Not on file  . Food insecurity:    Worry: Not on file    Inability: Not on file  . Transportation needs:    Medical: Not on file    Non-medical: Not on file  Tobacco Use  . Smoking status: Former Smoker    Packs/day: 0.10    Years: 13.00    Pack years: 1.30    Types: Cigarettes    Last attempt to quit: 05/27/1977    Years since quitting: 40.4  . Smokeless tobacco: Former Systems developer    Quit date: 09/08/1968  Substance and Sexual Activity  . Alcohol use: No  . Drug use: No  . Sexual activity: Yes    Partners: Female    Birth control/protection: None  Lifestyle  . Physical activity:    Days per week: Not on file    Minutes per session: Not on file  . Stress: Not on file  Relationships  . Social connections:    Talks on phone: Not on file    Gets together: Not on file    Attends religious service: Not on file    Active member of club or organization: Not on file    Attends meetings of clubs or organizations: Not on file    Relationship status: Not on file  . Intimate partner violence:    Fear of current or ex partner: Not on file    Emotionally abused: Not on file    Physically abused: Not on file    Forced sexual activity: Not on file  Other Topics Concern  . Not on file  Social History Narrative  . Not on file     Review of Systems: General: negative for chills, fever, night sweats or weight changes.  Cardiovascular: negative for chest pain, dyspnea on exertion, edema, orthopnea, palpitations, paroxysmal nocturnal dyspnea or shortness of breath Dermatological: negative for rash Respiratory: negative for cough or wheezing Urologic: negative for hematuria Abdominal: negative for nausea, vomiting, diarrhea, bright red blood per rectum, melena, or hematemesis Neurologic: negative for visual changes, syncope, or dizziness All other systems reviewed and are otherwise negative except as noted above.    Blood pressure 126/64, pulse 69, height 6' (1.829 m), weight 232 lb  (105.2 kg).  General appearance: alert and no distress Neck: no adenopathy, no carotid bruit, no JVD, supple, symmetrical, trachea midline and thyroid not enlarged, symmetric, no tenderness/mass/nodules Lungs: clear to auscultation bilaterally Heart: regular rate and rhythm, S1, S2 normal, no murmur, click, rub or gallop Extremities: extremities normal, atraumatic, no cyanosis or edema Pulses: 2+ and symmetric Skin: Skin color, texture, turgor normal. No rashes or lesions Neurologic: Alert and oriented X 3, normal strength and tone. Normal symmetric reflexes. Normal  coordination and gait  EKG not performed today  ASSESSMENT AND PLAN:   CAD-s/p PCI History of CAD status post inferior STEMI February 2007 treated with stenting by myself at that time.  He had noncritical disease in his left system with normal LV function.  He developed chest pain on 08/29/2017 and was brought emergently to the Cath Lab where Dr. Angelena Form demonstrated a patent proximal stent with 90% stenosis at the "January" of the RCA which he stented with a 2.75 x 20 mm long Synergy drug-eluting stent postdilated to 3 mm.  He was discharged on Brilinta and Coumadin.  Aspirin was discontinued.  The plan was to transition from Brilinta to Plavix in 3 months which would be August 1.  Dyslipidemia History of dyslipidemia on statin therapy.  We will recheck a lipid and liver profile.  History of DVT (deep vein thrombosis) History of DVT and PE on chronic Coumadin anticoagulation.  Essential hypertension History of essential hypertension her blood pressure measured today at 126/64.  He is on amlodipine, losartan and metoprolol.  Continue current meds at current dosing.      Lorretta Harp MD FACP,FACC,FAHA, [facility] 10/23/2017 10:21 AM

## 2017-10-24 LAB — LIPID PANEL
CHOL/HDL RATIO: 3 ratio (ref 0.0–5.0)
Cholesterol, Total: 93 mg/dL — ABNORMAL LOW (ref 100–199)
HDL: 31 mg/dL — AB (ref >39)
LDL CALC: 26 mg/dL (ref 0–99)
Triglycerides: 178 mg/dL — ABNORMAL HIGH (ref 0–149)
VLDL CHOLESTEROL CAL: 36 mg/dL (ref 5–40)

## 2017-10-24 LAB — HEPATIC FUNCTION PANEL
ALT: 33 IU/L (ref 0–44)
AST: 27 IU/L (ref 0–40)
Albumin: 4 g/dL (ref 3.5–4.8)
Alkaline Phosphatase: 48 IU/L (ref 39–117)
Bilirubin Total: 0.9 mg/dL (ref 0.0–1.2)
Bilirubin, Direct: 0.26 mg/dL (ref 0.00–0.40)
Total Protein: 6.2 g/dL (ref 6.0–8.5)

## 2017-10-26 ENCOUNTER — Ambulatory Visit (INDEPENDENT_AMBULATORY_CARE_PROVIDER_SITE_OTHER): Payer: Medicare HMO | Admitting: *Deleted

## 2017-10-26 ENCOUNTER — Encounter: Payer: Self-pay | Admitting: *Deleted

## 2017-10-26 DIAGNOSIS — Z5181 Encounter for therapeutic drug level monitoring: Secondary | ICD-10-CM

## 2017-10-26 DIAGNOSIS — Z86711 Personal history of pulmonary embolism: Secondary | ICD-10-CM

## 2017-10-26 DIAGNOSIS — Z86718 Personal history of other venous thrombosis and embolism: Secondary | ICD-10-CM

## 2017-10-26 LAB — POCT INR: INR: 2.4 (ref 2.0–3.0)

## 2017-10-26 NOTE — Patient Instructions (Signed)
Continue coumadin 4mg daily.  Recheck INR in 4 weeks.  

## 2017-11-19 ENCOUNTER — Other Ambulatory Visit: Payer: Self-pay

## 2017-11-19 DIAGNOSIS — Z9861 Coronary angioplasty status: Principal | ICD-10-CM

## 2017-11-19 DIAGNOSIS — I2510 Atherosclerotic heart disease of native coronary artery without angina pectoris: Secondary | ICD-10-CM

## 2017-11-19 MED ORDER — CLOPIDOGREL BISULFATE 75 MG PO TABS: 75.0000 mg | ORAL_TABLET | Freq: Every day | ORAL | 3 refills | Status: DC

## 2017-11-22 DIAGNOSIS — I2510 Atherosclerotic heart disease of native coronary artery without angina pectoris: Secondary | ICD-10-CM | POA: Diagnosis not present

## 2017-11-22 DIAGNOSIS — E782 Mixed hyperlipidemia: Secondary | ICD-10-CM | POA: Diagnosis not present

## 2017-11-22 DIAGNOSIS — Z1389 Encounter for screening for other disorder: Secondary | ICD-10-CM | POA: Diagnosis not present

## 2017-11-22 DIAGNOSIS — E6609 Other obesity due to excess calories: Secondary | ICD-10-CM | POA: Diagnosis not present

## 2017-11-22 DIAGNOSIS — R197 Diarrhea, unspecified: Secondary | ICD-10-CM | POA: Diagnosis not present

## 2017-11-22 DIAGNOSIS — Z0001 Encounter for general adult medical examination with abnormal findings: Secondary | ICD-10-CM | POA: Diagnosis not present

## 2017-11-22 DIAGNOSIS — Z6833 Body mass index (BMI) 33.0-33.9, adult: Secondary | ICD-10-CM | POA: Diagnosis not present

## 2017-11-22 DIAGNOSIS — G64 Other disorders of peripheral nervous system: Secondary | ICD-10-CM | POA: Diagnosis not present

## 2017-11-23 ENCOUNTER — Telehealth: Payer: Self-pay | Admitting: Cardiovascular Disease

## 2017-11-23 DIAGNOSIS — Z9861 Coronary angioplasty status: Principal | ICD-10-CM

## 2017-11-23 DIAGNOSIS — I2510 Atherosclerotic heart disease of native coronary artery without angina pectoris: Secondary | ICD-10-CM

## 2017-11-23 NOTE — Telephone Encounter (Signed)
° °*  STAT* If patient is at the pharmacy, call can be transferred to refill team.   1. Which medications need to be refilled? (please list name of each medication and dose if known) Plaix 75mg   2. Which pharmacy/location (including street and city if local pharmacy) is medication to be sent to? [facility]  [address] [address] [place] [address]  By mail  3. Do they need a 30 day or 90 day supply? 90 order ID #[id number]

## 2017-11-26 ENCOUNTER — Ambulatory Visit (INDEPENDENT_AMBULATORY_CARE_PROVIDER_SITE_OTHER): Payer: Medicare HMO | Admitting: *Deleted

## 2017-11-26 DIAGNOSIS — Z86718 Personal history of other venous thrombosis and embolism: Secondary | ICD-10-CM

## 2017-11-26 DIAGNOSIS — Z86711 Personal history of pulmonary embolism: Secondary | ICD-10-CM

## 2017-11-26 DIAGNOSIS — Z5181 Encounter for therapeutic drug level monitoring: Secondary | ICD-10-CM

## 2017-11-26 LAB — POCT INR: INR: 2.3 (ref 2.0–3.0)

## 2017-11-26 MED ORDER — CLOPIDOGREL BISULFATE 75 MG PO TABS: 75.0000 mg | ORAL_TABLET | Freq: Every day | ORAL | 3 refills | Status: DC

## 2017-11-26 NOTE — Telephone Encounter (Signed)
We don't do plavix.  Warfarin, Eliquis and Xarelto

## 2017-11-26 NOTE — Patient Instructions (Signed)
Continue coumadin 4mg daily.  Recheck INR in 4 weeks.  

## 2017-11-26 NOTE — Addendum Note (Signed)
Addended by: Waylan Rocher on: 11/26/2017 03:31 PM   Modules accepted: Orders

## 2017-11-27 ENCOUNTER — Other Ambulatory Visit: Payer: Self-pay

## 2017-11-27 DIAGNOSIS — Z9861 Coronary angioplasty status: Principal | ICD-10-CM

## 2017-11-27 DIAGNOSIS — I2510 Atherosclerotic heart disease of native coronary artery without angina pectoris: Secondary | ICD-10-CM

## 2017-11-27 MED ORDER — CLOPIDOGREL BISULFATE 75 MG PO TABS
75.0000 mg | ORAL_TABLET | Freq: Every day | ORAL | 3 refills | Status: DC
Start: 2017-11-27 — End: 2018-12-16

## 2017-12-04 ENCOUNTER — Encounter: Payer: Self-pay | Admitting: Internal Medicine

## 2017-12-24 ENCOUNTER — Ambulatory Visit (INDEPENDENT_AMBULATORY_CARE_PROVIDER_SITE_OTHER): Payer: Medicare HMO | Admitting: *Deleted

## 2017-12-24 DIAGNOSIS — Z86718 Personal history of other venous thrombosis and embolism: Secondary | ICD-10-CM

## 2017-12-24 DIAGNOSIS — Z5181 Encounter for therapeutic drug level monitoring: Secondary | ICD-10-CM

## 2017-12-24 DIAGNOSIS — Z86711 Personal history of pulmonary embolism: Secondary | ICD-10-CM | POA: Diagnosis not present

## 2017-12-24 LAB — POCT INR: INR: 2.2 (ref 2.0–3.0)

## 2017-12-24 NOTE — Patient Instructions (Signed)
Continue coumadin 4mg daily.  Recheck INR in 4 weeks.  

## 2017-12-26 DIAGNOSIS — Z0000 Encounter for general adult medical examination without abnormal findings: Secondary | ICD-10-CM | POA: Diagnosis not present

## 2018-01-02 DIAGNOSIS — Z23 Encounter for immunization: Secondary | ICD-10-CM | POA: Diagnosis not present

## 2018-01-23 ENCOUNTER — Ambulatory Visit (INDEPENDENT_AMBULATORY_CARE_PROVIDER_SITE_OTHER): Payer: Medicare HMO | Admitting: *Deleted

## 2018-01-23 DIAGNOSIS — Z86711 Personal history of pulmonary embolism: Secondary | ICD-10-CM | POA: Diagnosis not present

## 2018-01-23 DIAGNOSIS — Z5181 Encounter for therapeutic drug level monitoring: Secondary | ICD-10-CM | POA: Diagnosis not present

## 2018-01-23 DIAGNOSIS — Z86718 Personal history of other venous thrombosis and embolism: Secondary | ICD-10-CM | POA: Diagnosis not present

## 2018-01-23 LAB — POCT INR: INR: 1.8 — AB (ref 2.0–3.0)

## 2018-01-23 NOTE — Patient Instructions (Signed)
Take coumadin 6mg  tonight, 5mg  tomorrow night then resume 4mg  daily.  Recheck INR in 3 weeks

## 2018-02-01 ENCOUNTER — Ambulatory Visit (INDEPENDENT_AMBULATORY_CARE_PROVIDER_SITE_OTHER): Payer: Medicare HMO | Admitting: Urology

## 2018-02-01 DIAGNOSIS — N5201 Erectile dysfunction due to arterial insufficiency: Secondary | ICD-10-CM | POA: Diagnosis not present

## 2018-02-13 ENCOUNTER — Ambulatory Visit (INDEPENDENT_AMBULATORY_CARE_PROVIDER_SITE_OTHER): Payer: Medicare HMO | Admitting: *Deleted

## 2018-02-13 DIAGNOSIS — Z5181 Encounter for therapeutic drug level monitoring: Secondary | ICD-10-CM | POA: Diagnosis not present

## 2018-02-13 DIAGNOSIS — Z86711 Personal history of pulmonary embolism: Secondary | ICD-10-CM | POA: Diagnosis not present

## 2018-02-13 DIAGNOSIS — Z86718 Personal history of other venous thrombosis and embolism: Secondary | ICD-10-CM

## 2018-02-13 LAB — POCT INR: INR: 2.3 (ref 2.0–3.0)

## 2018-02-13 NOTE — Patient Instructions (Signed)
Continue coumadin 4mg daily.  Recheck INR in 4 weeks.  

## 2018-03-08 ENCOUNTER — Other Ambulatory Visit: Payer: Self-pay | Admitting: *Deleted

## 2018-03-08 ENCOUNTER — Encounter: Payer: Self-pay | Admitting: Gastroenterology

## 2018-03-08 ENCOUNTER — Encounter: Payer: Self-pay | Admitting: *Deleted

## 2018-03-08 ENCOUNTER — Ambulatory Visit (INDEPENDENT_AMBULATORY_CARE_PROVIDER_SITE_OTHER): Payer: Medicare HMO | Admitting: Gastroenterology

## 2018-03-08 ENCOUNTER — Telehealth: Payer: Self-pay | Admitting: *Deleted

## 2018-03-08 VITALS — BP 131/75 | HR 79 | Temp 97.3°F | Ht 72.0 in | Wt 233.4 lb

## 2018-03-08 DIAGNOSIS — R1032 Left lower quadrant pain: Secondary | ICD-10-CM | POA: Diagnosis not present

## 2018-03-08 DIAGNOSIS — K529 Noninfective gastroenteritis and colitis, unspecified: Secondary | ICD-10-CM | POA: Insufficient documentation

## 2018-03-08 MED ORDER — DICYCLOMINE HCL 10 MG PO CAPS: 10.0000 mg | ORAL_CAPSULE | Freq: Three times a day (TID) | ORAL | 3 refills | Status: DC

## 2018-03-08 NOTE — Progress Notes (Signed)
Referring Provider: Redmond School, MD Primary Care Physician:  Redmond School, MD  Primary GI: Dr. Gala Romney   Chief Complaint  Patient presents with  . Diarrhea    usually after eating    HPI:   Steven Oneal is a 72 y.o. male presenting today with a history of adenomas, with last colonoscopy in 2017. Due for surveillance in 2022. EGD on file normal from 2017. Last seen several years ago. Presenting today at request of Dr. Gerarda Fraction due to diarrhea.  Cdiff toxin A/B and GDH negative in July 2019. Diarrhea for over a year. Normal BM habits prior to this were twice a day, soft. Now with postprandial diarrhea. Lower abdominal discomfort relieved with diarrhea. Intermittent abdominal cramping. #6. Scant blood at times if using bathroom a lot. Chronic Coumadin. No lack of appetite. Stool floats. About 5 loose stools per day. Taking Metamucil for past 5 years. No weight loss. Tried Imodium with mild improvement but still diarrhea.   MI May 2019: stent placement. Was on Brilinta but now no Plavix.     Past Medical History:  Diagnosis Date  . Allergic rhinitis   . Anticoagulated on Coumadin   . CAD (coronary artery disease) cardiologist-  dr berry   DES Cypher x 2 mid & ostial RCA 2007; Repeat LHC 06/2014 angiographically minimal CAD with widely patent RCA stents EF 60-65% 08/29/17 STEMI with PCI/DES x1 to mRCA  . Dyslipidemia   . ED (erectile dysfunction)    vascular  . Essential hypertension   . First degree heart block   . GERD (gastroesophageal reflux disease)   . Hiatal hernia   . History of adenomatous polyp of colon    tubular adenoma 08-10-2010  . History of DVT (deep vein thrombosis) 11/08/2012   LLE  . History of pulmonary embolus (PE) 11/08/2012   LLL  . History of ST elevation myocardial infarction (STEMI) 06/15/2005   inferior wall stemi-- s/p  pci and des x2 to rca  . Nocturia   . PAC (premature atrial contraction)   . S/P drug eluting coronary stent placement  06/15/2005   to mid and ostial RCA  . Vertigo   . Wears dentures    upper denture and lower partial    Past Surgical History:  Procedure Laterality Date  . CARDIAC CATHETERIZATION  10/09/2005   Patent stents, medical therapy  . CARDIAC CATHETERIZATION  10/22/2006   Nonobstructive disease, 60% LAD, patent RCA stents  . CARDIOVASCULAR STRESS TEST  05-08-2013  dr berry   Low risk nuclear perfusion study w/ basal inferior wal nontransmural infarction and no evidence ischemia/   normal LV function and mild basal inferior wall hypokinesis,  ef 65%  . CHOLECYSTECTOMY N/A 07/14/2013   Procedure: LAPAROSCOPIC CHOLECYSTECTOMY;  Surgeon: Jamesetta So, MD;  Location: [facility];  Service: General;  Laterality: N/A;  . COLONOSCOPY N/A 08/03/2015   Dr. Gala Romney: diverticulosis in entire colon, one 4 mm tubular adenoma, 5 year surveillance  . CORONARY ANGIOPLASTY WITH STENT PLACEMENT  06/15/2005  dr Tami Ribas   Cypher stent to mid RCA; cypher stent -ostial RCA, RESIDUAL disease LAD  . CORONARY/GRAFT ACUTE MI REVASCULARIZATION N/A 08/29/2017   Procedure: Coronary/Graft Acute MI Revascularization;  Surgeon: Burnell Blanks, MD;  Location: [place] [facility];  Service: Cardiovascular;  Laterality: N/A;  . ESOPHAGOGASTRODUODENOSCOPY N/A 08/03/2015   Dr. Gala Romney: small hiatal hernia, otherwise normal  . LAPAROSCOPIC APPENDECTOMY  03/10/2008  . LEFT HEART CATH AND CORONARY ANGIOGRAPHY N/A  08/29/2017   Procedure: LEFT HEART CATH AND CORONARY ANGIOGRAPHY;  Surgeon: Burnell Blanks, MD;  Location: [facility];  Service: Cardiovascular;  Laterality: N/A;  . LEFT HEART CATHETERIZATION WITH CORONARY ANGIOGRAM N/A 06/12/2014   Procedure: LEFT HEART CATHETERIZATION WITH CORONARY ANGIOGRAM;  Surgeon: Leonie Man, MD;  Location: [facility];  Service: Cardiovascular;  Laterality: N/A;  Minimal CAD w/ widely patent RCA stents;  well-preserved LVEF w/ normal EDP   . PENILE PROSTHESIS IMPLANT N/A 09/19/2016    Procedure: INSERTION SEMI-RIGID PENILE PROSTHESIS COLOPLAST;  Surgeon: Irine Seal, MD;  Location: [facility];  Service: Urology;  Laterality: N/A;    Current Outpatient Medications  Medication Sig Dispense Refill  . Alpha Lipoic Acid 200 MG CAPS Take 1 capsule by mouth daily.    Marland Kitchen amLODipine (NORVASC) 5 MG tablet TAKE ONE TABLET BY MOUTH ONCE DAILY 90 tablet 3  . atorvastatin (LIPITOR) 80 MG tablet Take 1 tablet (80 mg total) by mouth daily at 6 PM. 90 tablet 1  . clopidogrel (PLAVIX) 75 MG tablet Take 1 tablet (75 mg total) by mouth daily. 90 tablet 3  . diazepam (VALIUM) 10 MG tablet Take 10 mg by mouth at bedtime as needed for anxiety or sleep.     . fish oil-omega-3 fatty acids 1000 MG capsule Take 1 g by mouth 3 (three) times daily.     Marland Kitchen levothyroxine (SYNTHROID, LEVOTHROID) 25 MCG tablet Take 25 mcg by mouth daily.  3  . losartan (COZAAR) 50 MG tablet TAKE 1 TABLET(50 MG) BY MOUTH DAILY 90 tablet 0  . meclizine (ANTIVERT) 25 MG tablet Take 25 mg by mouth as needed for dizziness (FOR INNER EAR).     . metoprolol tartrate (LOPRESSOR) 25 MG tablet TAKE 1 TABLET BY MOUTH TWICE DAILY 60 tablet 0  . Multiple Vitamin (MULTIVITAMIN WITH MINERALS) TABS tablet Take 1 tablet by mouth daily.    . nitroGLYCERIN (NITROSTAT) 0.4 MG SL tablet Place 1 tablet (0.4 mg total) under the tongue every 5 (five) minutes as needed. 25 tablet 2  . omeprazole (PRILOSEC) 20 MG capsule Take 20 mg by mouth every morning.     . tamsulosin (FLOMAX) 0.4 MG CAPS capsule Take 1 capsule (0.4 mg total) by mouth daily. 30 capsule 1  . warfarin (COUMADIN) 1 MG tablet Take 1 mg by mouth daily.    Marland Kitchen warfarin (COUMADIN) 3 MG tablet Take 3 mg by mouth daily.     No current facility-administered medications for this visit.     Allergies as of 03/08/2018 - Review Complete 03/08/2018  Allergen Reaction Noted  . Codeine Hives, Itching, and Nausea Only 08/01/2010  . Niaspan [niacin er] Other (See Comments)  08/02/2015    Family History  Problem Relation Age of Onset  . Colon cancer Mother 58  . Colon cancer Brother 62       Rectal cancer  . Pulmonary embolism Brother        Cancer  . Colon cancer Paternal Uncle 27  . Colon cancer Paternal Uncle 89  . Throat cancer Brother 58  . Prostate cancer Brother     Social History   Socioeconomic History  . Marital status: Married    Spouse name: Not on file  . Number of children: 2  . Years of education: Not on file  . Highest education level: Not on file  Occupational History  . Occupation: disablity    Comment: CAD  Social Needs  . Financial  resource strain: Not on file  . Food insecurity:    Worry: Not on file    Inability: Not on file  . Transportation needs:    Medical: Not on file    Non-medical: Not on file  Tobacco Use  . Smoking status: Former Smoker    Packs/day: 0.10    Years: 13.00    Pack years: 1.30    Types: Cigarettes    Last attempt to quit: 05/27/1977    Years since quitting: 40.8  . Smokeless tobacco: Former Systems developer    Quit date: 09/08/1968  Substance and Sexual Activity  . Alcohol use: No  . Drug use: No  . Sexual activity: Yes    Partners: Female    Birth control/protection: None  Lifestyle  . Physical activity:    Days per week: Not on file    Minutes per session: Not on file  . Stress: Not on file  Relationships  . Social connections:    Talks on phone: Not on file    Gets together: Not on file    Attends religious service: Not on file    Active member of club or organization: Not on file    Attends meetings of clubs or organizations: Not on file    Relationship status: Not on file  Other Topics Concern  . Not on file  Social History Narrative  . Not on file    Review of Systems: Gen: Denies fever, chills, anorexia. Denies fatigue, weakness, weight loss.  CV: Denies chest pain, palpitations, syncope, peripheral edema, and claudication. Resp: Denies dyspnea at rest, cough, wheezing,  coughing up blood, and pleurisy. GI: see HPI  Derm: Denies rash, itching, dry skin Psych: Denies depression, anxiety, memory loss, confusion. No homicidal or suicidal ideation.  Heme: see HPI   Physical Exam: BP 131/75   Pulse 79   Temp (!) 97.3 F (36.3 C) (Oral)   Ht 6' (1.829 m)   Wt 233 lb 6.4 oz (105.9 kg)   BMI 31.65 kg/m  General:   Alert and oriented. No distress noted. Pleasant and cooperative.  Head:  Normocephalic and atraumatic. Eyes:  Conjuctiva clear without scleral icterus. Mouth:  Oral mucosa pink and moist.  Abdomen:  +BS, soft, mildly TTP lower abdomen, moderately TTP LLQ and non-distended. No rebound or guarding. No HSM or masses noted. Msk:  Symmetrical without gross deformities. Normal posture. Extremities:  Without edema. Neurologic:  Alert and  oriented x4 Psych:  Alert and cooperative. Normal mood and affect.  Outside labs July 2019: Hgb 14, platelets 210, unrevealing. CDIFF toxin A/B EIA negative. Creatinine 0.68, Tbili 1.3, Alk Phos 49, AST 33, ALT 35, TSH mildly elevated at 4.930

## 2018-03-08 NOTE — Assessment & Plan Note (Addendum)
72 year old male with change in bowel habits to postprandial diarrhea for over a year. No weight loss documented, good appetite. Labs including CBC, HFP, unrevealing. TSH only mildly elevated. Cdiff toxin A/B EIA negative. Colonoscopy fairly recently on file. Will trial low dose Bentyl and pursue CT abd/pelvis in interim due to LLQ/lower abdominal pain. May need to trial pancreatic enzymes. Will need to consider updated colonoscopy with random colonic biopsies if no improvement with supportive measures. Thus far, no alarm features. Close follow-up planned. As of note, I have asked him to stop Metamucil for now.

## 2018-03-08 NOTE — Telephone Encounter (Signed)
PA for CT ABD/PEL has been approved via [employer]. Auth # [id number] dates 03/25/18-04/24/18.

## 2018-03-08 NOTE — Patient Instructions (Signed)
I have ordered a CT scan.   Let's stop the Metamucil for now. We can restart it if things get better with diarrhea.   I have sent in Bentyl (low dose) to take before meals. Monitor for constipation, dry mouth, dizziness. Stop taking if this occurs. We may trial pancreatic enzymes if you don't have much improvement with Bentyl.   I would like to see you sometime in Jan 2020.   Please call with how you do with the Bentyl.   It was a pleasure to see you today. I strive to create trusting relationships with patients to provide genuine, compassionate, and quality care. I value your feedback. If you receive a survey regarding your visit,  I greatly appreciate you taking time to fill this out.   Annitta Needs, PhD, ANP-BC [facility]

## 2018-03-08 NOTE — Assessment & Plan Note (Signed)
CT abd/pelvis ordered.

## 2018-03-12 DIAGNOSIS — R1032 Left lower quadrant pain: Secondary | ICD-10-CM | POA: Diagnosis not present

## 2018-03-12 LAB — BUN: BUN: 10 mg/dL (ref 7–25)

## 2018-03-12 LAB — CREATININE, SERUM: Creat: 0.79 mg/dL (ref 0.70–1.18)

## 2018-03-12 NOTE — Progress Notes (Signed)
CC'ED TO PCP 

## 2018-03-20 ENCOUNTER — Ambulatory Visit (INDEPENDENT_AMBULATORY_CARE_PROVIDER_SITE_OTHER): Payer: Medicare HMO | Admitting: *Deleted

## 2018-03-20 DIAGNOSIS — Z86711 Personal history of pulmonary embolism: Secondary | ICD-10-CM

## 2018-03-20 DIAGNOSIS — Z86718 Personal history of other venous thrombosis and embolism: Secondary | ICD-10-CM

## 2018-03-20 DIAGNOSIS — Z5181 Encounter for therapeutic drug level monitoring: Secondary | ICD-10-CM

## 2018-03-20 LAB — POCT INR: INR: 1.8 — AB (ref 2.0–3.0)

## 2018-03-20 NOTE — Patient Instructions (Signed)
Increase coumadin to 4mg  daily except 6mg  on Wednesdays.  Recheck INR in 3 weeks

## 2018-03-21 ENCOUNTER — Telehealth: Payer: Self-pay | Admitting: Internal Medicine

## 2018-03-21 NOTE — Telephone Encounter (Signed)
PATIENT CALLED AND SAID HIS DIARRHEA HAS SLACKED UP BUT HIS STOMACH IS STILL HURTING.

## 2018-03-21 NOTE — Telephone Encounter (Signed)
Spoke with pt. He was calling to give AB a report. His diarrhea isn't as severe as it was. He has pain/tenderness in the same are 4 inches below the waste when touched. Pt he preparing to take his prep on 03/25/18 with CT 03/26/18. Pt will wait for results when they come in.

## 2018-03-25 NOTE — Telephone Encounter (Signed)
Noted. Routing message.  

## 2018-03-25 NOTE — Telephone Encounter (Signed)
What prep is he talking about for 11/25?

## 2018-03-25 NOTE — Telephone Encounter (Signed)
Thanks

## 2018-03-25 NOTE — Telephone Encounter (Signed)
He is scheduled for CT abd/pelvis w/contrast 03/26/18 at 5:00pm. Spoke to [place] at [facility] CT. She advised he will drink contrast tomorrow at 3:00pm and 4:00pm. Called and informed pt he doesn't drink contrast today. Informed him to be NPO after 1:00pm tomorrow and drink contrast at 3:00pm and 4:00pm. Verbalized understanding. Stated he did receive instructions when he picked up the contrast.

## 2018-03-26 ENCOUNTER — Ambulatory Visit (HOSPITAL_COMMUNITY)
Admission: RE | Admit: 2018-03-26 | Discharge: 2018-03-26 | Disposition: A | Discharge status: Home / Self Care | Payer: Medicare HMO | Source: Ambulatory Visit | Attending: Gastroenterology | Admitting: Gastroenterology

## 2018-03-26 DIAGNOSIS — K5730 Diverticulosis of large intestine without perforation or abscess without bleeding: Secondary | ICD-10-CM | POA: Diagnosis not present

## 2018-03-26 DIAGNOSIS — R1032 Left lower quadrant pain: Secondary | ICD-10-CM | POA: Diagnosis not present

## 2018-03-26 MED ORDER — IOPAMIDOL (ISOVUE-300) INJECTION 61%
100.0000 mL | Freq: Once | INTRAVENOUS | Status: AC | PRN
  Administered 2018-03-26: 100 mL via INTRAVENOUS

## 2018-04-03 NOTE — Progress Notes (Signed)
No acute abnormality on CT. Fatty liver. Hope he is doing better!

## 2018-04-08 DIAGNOSIS — E6609 Other obesity due to excess calories: Secondary | ICD-10-CM | POA: Diagnosis not present

## 2018-04-08 DIAGNOSIS — J019 Acute sinusitis, unspecified: Secondary | ICD-10-CM | POA: Diagnosis not present

## 2018-04-08 DIAGNOSIS — Z6833 Body mass index (BMI) 33.0-33.9, adult: Secondary | ICD-10-CM | POA: Diagnosis not present

## 2018-04-29 ENCOUNTER — Ambulatory Visit: Payer: Medicare HMO | Admitting: Cardiology

## 2018-05-13 ENCOUNTER — Ambulatory Visit (INDEPENDENT_AMBULATORY_CARE_PROVIDER_SITE_OTHER): Payer: Medicare HMO | Admitting: Pharmacist

## 2018-05-13 DIAGNOSIS — Z86711 Personal history of pulmonary embolism: Secondary | ICD-10-CM | POA: Diagnosis not present

## 2018-05-13 DIAGNOSIS — Z86718 Personal history of other venous thrombosis and embolism: Secondary | ICD-10-CM | POA: Diagnosis not present

## 2018-05-13 DIAGNOSIS — Z5181 Encounter for therapeutic drug level monitoring: Secondary | ICD-10-CM | POA: Diagnosis not present

## 2018-05-13 LAB — POCT INR: INR: 1.6 — AB (ref 2.0–3.0)

## 2018-05-13 NOTE — Patient Instructions (Signed)
Description   Take 6mg  today then continue coumadin to 4mg  daily except 6mg  on Wednesdays.  Recheck INR in 3 weeks

## 2018-06-03 ENCOUNTER — Ambulatory Visit (INDEPENDENT_AMBULATORY_CARE_PROVIDER_SITE_OTHER): Payer: Medicare HMO | Admitting: Pharmacist

## 2018-06-03 DIAGNOSIS — Z5181 Encounter for therapeutic drug level monitoring: Secondary | ICD-10-CM

## 2018-06-03 DIAGNOSIS — Z86718 Personal history of other venous thrombosis and embolism: Secondary | ICD-10-CM | POA: Diagnosis not present

## 2018-06-03 DIAGNOSIS — Z86711 Personal history of pulmonary embolism: Secondary | ICD-10-CM | POA: Diagnosis not present

## 2018-06-03 LAB — POCT INR: INR: 1.7 — AB (ref 2.0–3.0)

## 2018-06-03 NOTE — Patient Instructions (Signed)
Description   Take 6mg  today then change dose to coumadin 4mg  daily except 6mg  on Wednesdays and Saturdays.  Recheck INR in 3 weeks.

## 2018-06-06 ENCOUNTER — Ambulatory Visit (INDEPENDENT_AMBULATORY_CARE_PROVIDER_SITE_OTHER): Payer: Medicare HMO | Admitting: Gastroenterology

## 2018-06-06 ENCOUNTER — Encounter: Payer: Self-pay | Admitting: Gastroenterology

## 2018-06-06 VITALS — BP 131/72 | HR 79 | Temp 97.3°F | Ht 72.0 in | Wt 232.8 lb

## 2018-06-06 DIAGNOSIS — K529 Noninfective gastroenteritis and colitis, unspecified: Secondary | ICD-10-CM

## 2018-06-06 MED ORDER — DICYCLOMINE HCL 10 MG PO CAPS: 10.0000 mg | ORAL_CAPSULE | Freq: Three times a day (TID) | ORAL | 3 refills | Status: DC

## 2018-06-06 NOTE — Assessment & Plan Note (Signed)
Much improved with Bentyl TID. Abdominal pain resolved as well. Has noticed diet plays a role as well and changed eating habits. Reportedly negative hemoccult recently through PCP. Discussed tapering down from Bentyl if possible. Return in 6 months or sooner if needed. Next colonoscopy 2020.

## 2018-06-06 NOTE — Patient Instructions (Signed)
I have refilled Bentyl for you! Take this before meals three times a day. You can start tapering down to just twice a day or as needed. Let me know if diarrhea worsens.  We will see you in 6 months or sooner if needed!  I enjoyed seeing you again today! As you know, I value our relationship and want to provide genuine, compassionate, and quality care. I welcome your feedback. If you receive a survey regarding your visit,  I greatly appreciate you taking time to fill this out. See you next time!  Annitta Needs, PhD, ANP-BC [facility]

## 2018-06-06 NOTE — Progress Notes (Signed)
Referring Provider: Redmond School, MD Primary Care Physician:  Redmond School, MD  Primary GI: Dr. Gala Romney   Chief Complaint  Patient presents with  . Diarrhea    f/u. It is much better  . Abdominal Pain    HPI:   Steven Oneal is a 73 y.o. male presenting today with a history of adenomas and due for surveillance in 2022. EGD on file normal from 2017. Last seen in Nov 2019 for diarrhea. As labs unrevealing, Cdiff negative, low dose Bentyl was prescribed. CT abd/pelvis completed due to abdominal pain. This showed only a fatty liver, colonic diverticulosis, and no other concerning findings.    Symptoms much improved. Abdominal pain is gone. Has about 3 BMs a day, about a #6. No rectal bleeding. States he did a hemoccult with Dr. Gerarda Fraction and was negative. Lucky Charms with milk "tears me up", also Special K with strawberries. Now just eating 2 eggs instead. Overall much improved.   Under significant stress with his wife, who is 59 and has dementia. Needs full-time care.   Past Medical History:  Diagnosis Date  . Allergic rhinitis   . Anticoagulated on Coumadin   . CAD (coronary artery disease) cardiologist-  dr berry   DES Cypher x 2 mid & ostial RCA 2007; Repeat LHC 06/2014 angiographically minimal CAD with widely patent RCA stents EF 60-65% 08/29/17 STEMI with PCI/DES x1 to mRCA  . Dyslipidemia   . ED (erectile dysfunction)    vascular  . Essential hypertension   . First degree heart block   . GERD (gastroesophageal reflux disease)   . Hiatal hernia   . History of adenomatous polyp of colon    tubular adenoma 08-10-2010  . History of DVT (deep vein thrombosis) 11/08/2012   LLE  . History of pulmonary embolus (PE) 11/08/2012   LLL  . History of ST elevation myocardial infarction (STEMI) 06/15/2005   inferior wall stemi-- s/p  pci and des x2 to rca  . Nocturia   . PAC (premature atrial contraction)   . S/P drug eluting coronary stent placement 06/15/2005   to mid and  ostial RCA  . Vertigo   . Wears dentures    upper denture and lower partial    Past Surgical History:  Procedure Laterality Date  . CARDIAC CATHETERIZATION  10/09/2005   Patent stents, medical therapy  . CARDIAC CATHETERIZATION  10/22/2006   Nonobstructive disease, 60% LAD, patent RCA stents  . CARDIOVASCULAR STRESS TEST  05-08-2013  dr berry   Low risk nuclear perfusion study w/ basal inferior wal nontransmural infarction and no evidence ischemia/   normal LV function and mild basal inferior wall hypokinesis,  ef 65%  . CHOLECYSTECTOMY N/A 07/14/2013   Procedure: LAPAROSCOPIC CHOLECYSTECTOMY;  Surgeon: Jamesetta So, MD;  Location: [facility];  Service: General;  Laterality: N/A;  . COLONOSCOPY N/A 08/03/2015   Dr. Gala Romney: diverticulosis in entire colon, one 4 mm tubular adenoma, 5 year surveillance  . CORONARY ANGIOPLASTY WITH STENT PLACEMENT  06/15/2005  dr Tami Ribas   Cypher stent to mid RCA; cypher stent -ostial RCA, RESIDUAL disease LAD  . CORONARY/GRAFT ACUTE MI REVASCULARIZATION N/A 08/29/2017   Procedure: Coronary/Graft Acute MI Revascularization;  Surgeon: Burnell Blanks, MD;  Location: [place] [facility];  Service: Cardiovascular;  Laterality: N/A;  . ESOPHAGOGASTRODUODENOSCOPY N/A 08/03/2015   Dr. Gala Romney: small hiatal hernia, otherwise normal  . LAPAROSCOPIC APPENDECTOMY  03/10/2008  . LEFT HEART CATH AND CORONARY ANGIOGRAPHY N/A 08/29/2017  Procedure: LEFT HEART CATH AND CORONARY ANGIOGRAPHY;  Surgeon: Burnell Blanks, MD;  Location: [place] [facility];  Service: Cardiovascular;  Laterality: N/A;  . LEFT HEART CATHETERIZATION WITH CORONARY ANGIOGRAM N/A 06/12/2014   Procedure: LEFT HEART CATHETERIZATION WITH CORONARY ANGIOGRAM;  Surgeon: Leonie Man, MD;  Location: [facility];  Service: Cardiovascular;  Laterality: N/A;  Minimal CAD w/ widely patent RCA stents;  well-preserved LVEF w/ normal EDP   . PENILE PROSTHESIS IMPLANT N/A 09/19/2016   Procedure: INSERTION  SEMI-RIGID PENILE PROSTHESIS COLOPLAST;  Surgeon: Irine Seal, MD;  Location: [facility];  Service: Urology;  Laterality: N/A;    Current Outpatient Medications  Medication Sig Dispense Refill  . Alpha Lipoic Acid 200 MG CAPS Take 1 capsule by mouth daily.    Marland Kitchen amLODipine (NORVASC) 5 MG tablet TAKE ONE TABLET BY MOUTH ONCE DAILY 90 tablet 3  . atorvastatin (LIPITOR) 80 MG tablet Take 1 tablet (80 mg total) by mouth daily at 6 PM. 90 tablet 1  . clopidogrel (PLAVIX) 75 MG tablet Take 1 tablet (75 mg total) by mouth daily. 90 tablet 3  . diazepam (VALIUM) 10 MG tablet Take 10 mg by mouth at bedtime as needed for anxiety or sleep.     Marland Kitchen dicyclomine (BENTYL) 10 MG capsule Take 1 capsule (10 mg total) by mouth 3 (three) times daily before meals. 120 capsule 3  . fish oil-omega-3 fatty acids 1000 MG capsule Take 1 g by mouth 3 (three) times daily.     Marland Kitchen levothyroxine (SYNTHROID, LEVOTHROID) 25 MCG tablet Take 25 mcg by mouth daily.  3  . losartan (COZAAR) 50 MG tablet TAKE 1 TABLET(50 MG) BY MOUTH DAILY 90 tablet 0  . meclizine (ANTIVERT) 25 MG tablet Take 25 mg by mouth as needed for dizziness (FOR INNER EAR).     . metoprolol tartrate (LOPRESSOR) 25 MG tablet TAKE 1 TABLET BY MOUTH TWICE DAILY 60 tablet 0  . Multiple Vitamin (MULTIVITAMIN WITH MINERALS) TABS tablet Take 1 tablet by mouth daily.    . nitroGLYCERIN (NITROSTAT) 0.4 MG SL tablet Place 1 tablet (0.4 mg total) under the tongue every 5 (five) minutes as needed. 25 tablet 2  . omeprazole (PRILOSEC) 20 MG capsule Take 20 mg by mouth every morning.     . tamsulosin (FLOMAX) 0.4 MG CAPS capsule Take 1 capsule (0.4 mg total) by mouth daily. 30 capsule 1  . warfarin (COUMADIN) 1 MG tablet Take 1 mg by mouth daily.    Marland Kitchen warfarin (COUMADIN) 3 MG tablet Take 3 mg by mouth daily.    Marland Kitchen dicyclomine (BENTYL) 10 MG capsule Take 1 capsule (10 mg total) by mouth 3 (three) times daily before meals. 270 capsule 3   No current  facility-administered medications for this visit.     Allergies as of 06/06/2018 - Review Complete 06/06/2018  Allergen Reaction Noted  . Codeine Hives, Itching, and Nausea Only 08/01/2010  . Niaspan [niacin er] Other (See Comments) 08/02/2015    Family History  Problem Relation Age of Onset  . Colon cancer Mother 19  . Colon cancer Brother 51       Rectal cancer  . Pulmonary embolism Brother        Cancer  . Colon cancer Paternal Uncle 26  . Colon cancer Paternal Uncle 83  . Throat cancer Brother 25  . Prostate cancer Brother     Social History   Socioeconomic History  . Marital status: Married  Spouse name: Not on file  . Number of children: 2  . Years of education: Not on file  . Highest education level: Not on file  Occupational History  . Occupation: disablity    Comment: CAD  Social Needs  . Financial resource strain: Not on file  . Food insecurity:    Worry: Not on file    Inability: Not on file  . Transportation needs:    Medical: Not on file    Non-medical: Not on file  Tobacco Use  . Smoking status: Former Smoker    Packs/day: 0.10    Years: 13.00    Pack years: 1.30    Types: Cigarettes    Last attempt to quit: 05/27/1977    Years since quitting: 41.0  . Smokeless tobacco: Former Systems developer    Quit date: 09/08/1968  Substance and Sexual Activity  . Alcohol use: No  . Drug use: No  . Sexual activity: Yes    Partners: Female    Birth control/protection: None  Lifestyle  . Physical activity:    Days per week: Not on file    Minutes per session: Not on file  . Stress: Not on file  Relationships  . Social connections:    Talks on phone: Not on file    Gets together: Not on file    Attends religious service: Not on file    Active member of club or organization: Not on file    Attends meetings of clubs or organizations: Not on file    Relationship status: Not on file  Other Topics Concern  . Not on file  Social History Narrative  . Not on file     Review of Systems: Gen: Denies fever, chills, anorexia. Denies fatigue, weakness, weight loss.  CV: Denies chest pain, palpitations, syncope, peripheral edema, and claudication. Resp: Denies dyspnea at rest, cough, wheezing, coughing up blood, and pleurisy. GI: see HPI Derm: Denies rash, itching, dry skin Psych: Denies depression, anxiety, memory loss, confusion. No homicidal or suicidal ideation.  Heme: Denies bruising, bleeding, and enlarged lymph nodes.  Physical Exam: BP 131/72   Pulse 79   Temp (!) 97.3 F (36.3 C) (Oral)   Ht 6' (1.829 m)   Wt 232 lb 12.8 oz (105.6 kg)   BMI 31.57 kg/m  General:   Alert and oriented. No distress noted. Pleasant and cooperative.  Head:  Normocephalic and atraumatic. Eyes:  Conjuctiva clear without scleral icterus. Mouth:  Oral mucosa pink and moist.  Abdomen:  +BS, soft, non-tender and non-distended. No rebound or guarding. No HSM or masses noted. Msk:  Symmetrical without gross deformities. Normal posture. Extremities:  Without edema. Neurologic:  Alert and  oriented x4 Psych:  Alert and cooperative. Normal mood and affect.

## 2018-06-07 NOTE — Progress Notes (Signed)
cc'ed to pcp °

## 2018-06-26 ENCOUNTER — Ambulatory Visit (INDEPENDENT_AMBULATORY_CARE_PROVIDER_SITE_OTHER): Payer: Medicare HMO | Admitting: *Deleted

## 2018-06-26 DIAGNOSIS — Z86711 Personal history of pulmonary embolism: Secondary | ICD-10-CM | POA: Diagnosis not present

## 2018-06-26 DIAGNOSIS — Z5181 Encounter for therapeutic drug level monitoring: Secondary | ICD-10-CM | POA: Diagnosis not present

## 2018-06-26 DIAGNOSIS — Z86718 Personal history of other venous thrombosis and embolism: Secondary | ICD-10-CM | POA: Diagnosis not present

## 2018-06-26 LAB — POCT INR: INR: 2.2 (ref 2.0–3.0)

## 2018-06-26 NOTE — Patient Instructions (Signed)
Continue coumadin 4mg  daily except 6mg  on Wednesdays and Saturdays.  Recheck INR in 4 weeks.

## 2018-07-23 ENCOUNTER — Telehealth: Payer: Self-pay | Admitting: Pharmacist

## 2018-07-23 NOTE — Telephone Encounter (Signed)

## 2018-07-24 ENCOUNTER — Ambulatory Visit (INDEPENDENT_AMBULATORY_CARE_PROVIDER_SITE_OTHER): Payer: Medicare HMO | Admitting: *Deleted

## 2018-07-24 DIAGNOSIS — Z5181 Encounter for therapeutic drug level monitoring: Secondary | ICD-10-CM | POA: Diagnosis not present

## 2018-07-24 DIAGNOSIS — Z86718 Personal history of other venous thrombosis and embolism: Secondary | ICD-10-CM

## 2018-07-24 DIAGNOSIS — Z86711 Personal history of pulmonary embolism: Secondary | ICD-10-CM

## 2018-07-24 LAB — POCT INR: INR: 2.2 (ref 2.0–3.0)

## 2018-07-24 NOTE — Patient Instructions (Signed)
Continue coumadin 4mg  daily except 6mg  on Wednesdays and Saturdays.  Recheck INR in 5 weeks.

## 2018-08-27 ENCOUNTER — Telehealth: Payer: Self-pay | Admitting: *Deleted

## 2018-08-27 NOTE — Telephone Encounter (Signed)
° °  _____________   [id number] Pre-Screening Questions:   Do you currently have a fever? (yes = cancel and refer to pcp for e-visit)  NO  Have you recently travelled on a cruise, internationally, or to [place], [place], [place], [place], [place], or [place], [place] [employer]) ? N(yes = cancel, stay home, monitor symptoms, and contact pcp or initiate e-visit if symptoms develop)  NO  Have you been in contact with someone that is currently pending confirmation of Covid19 testing or has been confirmed to have the [place] virus?  N (yes = cancel, stay home, away from tested individual, monitor symptoms, and contact pcp or initiate e-visit if symptoms develop)  NO Are you currently experiencing fatigue or cough? N (yes = pt should be prepared to have a mask placed at the time of their visit). NO

## 2018-08-28 ENCOUNTER — Ambulatory Visit (INDEPENDENT_AMBULATORY_CARE_PROVIDER_SITE_OTHER): Payer: Medicare HMO | Admitting: Pharmacist Clinician (PhC)/ Clinical Pharmacy Specialist

## 2018-08-28 DIAGNOSIS — Z86718 Personal history of other venous thrombosis and embolism: Secondary | ICD-10-CM | POA: Diagnosis not present

## 2018-08-28 DIAGNOSIS — Z5181 Encounter for therapeutic drug level monitoring: Secondary | ICD-10-CM

## 2018-08-28 DIAGNOSIS — Z86711 Personal history of pulmonary embolism: Secondary | ICD-10-CM | POA: Diagnosis not present

## 2018-08-28 LAB — POCT INR: INR: 1.7 — AB (ref 2.0–3.0)

## 2018-08-28 NOTE — Patient Instructions (Signed)
Please take 8 mg tonight, then resume dosage of coumadin 4mg  daily except 6mg  on Wednesdays and Saturdays.  Recheck INR in 4 weeks.

## 2018-09-19 DIAGNOSIS — R7309 Other abnormal glucose: Secondary | ICD-10-CM | POA: Diagnosis not present

## 2018-09-19 DIAGNOSIS — I2510 Atherosclerotic heart disease of native coronary artery without angina pectoris: Secondary | ICD-10-CM | POA: Diagnosis not present

## 2018-09-19 DIAGNOSIS — Z681 Body mass index (BMI) 19 or less, adult: Secondary | ICD-10-CM | POA: Diagnosis not present

## 2018-09-19 DIAGNOSIS — R69 Illness, unspecified: Secondary | ICD-10-CM | POA: Diagnosis not present

## 2018-09-19 DIAGNOSIS — E669 Obesity, unspecified: Secondary | ICD-10-CM | POA: Diagnosis not present

## 2018-09-19 DIAGNOSIS — Z0000 Encounter for general adult medical examination without abnormal findings: Secondary | ICD-10-CM | POA: Diagnosis not present

## 2018-09-19 DIAGNOSIS — Z1389 Encounter for screening for other disorder: Secondary | ICD-10-CM | POA: Diagnosis not present

## 2018-09-19 DIAGNOSIS — I2699 Other pulmonary embolism without acute cor pulmonale: Secondary | ICD-10-CM | POA: Diagnosis not present

## 2018-09-19 DIAGNOSIS — E291 Testicular hypofunction: Secondary | ICD-10-CM | POA: Diagnosis not present

## 2018-09-24 DIAGNOSIS — I10 Essential (primary) hypertension: Secondary | ICD-10-CM | POA: Diagnosis not present

## 2018-09-24 DIAGNOSIS — R7309 Other abnormal glucose: Secondary | ICD-10-CM | POA: Diagnosis not present

## 2018-09-24 DIAGNOSIS — E7849 Other hyperlipidemia: Secondary | ICD-10-CM | POA: Diagnosis not present

## 2018-09-24 DIAGNOSIS — Z0000 Encounter for general adult medical examination without abnormal findings: Secondary | ICD-10-CM | POA: Diagnosis not present

## 2018-09-24 DIAGNOSIS — E782 Mixed hyperlipidemia: Secondary | ICD-10-CM | POA: Diagnosis not present

## 2018-09-24 DIAGNOSIS — Z79899 Other long term (current) drug therapy: Secondary | ICD-10-CM | POA: Diagnosis not present

## 2018-09-24 DIAGNOSIS — E291 Testicular hypofunction: Secondary | ICD-10-CM | POA: Diagnosis not present

## 2018-09-25 ENCOUNTER — Ambulatory Visit (INDEPENDENT_AMBULATORY_CARE_PROVIDER_SITE_OTHER): Payer: Medicare HMO | Admitting: *Deleted

## 2018-09-25 DIAGNOSIS — Z5181 Encounter for therapeutic drug level monitoring: Secondary | ICD-10-CM

## 2018-09-25 DIAGNOSIS — Z86711 Personal history of pulmonary embolism: Secondary | ICD-10-CM

## 2018-09-25 DIAGNOSIS — Z86718 Personal history of other venous thrombosis and embolism: Secondary | ICD-10-CM | POA: Diagnosis not present

## 2018-09-25 LAB — POCT INR: INR: 1.6 — AB (ref 2.0–3.0)

## 2018-09-25 NOTE — Patient Instructions (Signed)
Take coumadin 8 mg tonight, then increase dose to 6mg  daily except 4mg  on Sundays, Tuesdays and Thursdays.  Recheck INR in 4 weeks.

## 2018-10-18 DIAGNOSIS — Z7901 Long term (current) use of anticoagulants: Secondary | ICD-10-CM | POA: Diagnosis not present

## 2018-10-22 ENCOUNTER — Telehealth: Payer: Self-pay

## 2018-10-22 ENCOUNTER — Ambulatory Visit: Payer: Self-pay | Admitting: *Deleted

## 2018-10-22 NOTE — Telephone Encounter (Signed)
Called pt and informed that Dr. [name] will be in office on scheduled appointment date on 7/1. Appointment changed back to an in office visit and pt made aware.

## 2018-10-29 ENCOUNTER — Telehealth: Payer: Self-pay | Admitting: Cardiovascular Disease

## 2018-10-29 NOTE — Telephone Encounter (Signed)

## 2018-10-30 ENCOUNTER — Ambulatory Visit: Payer: Medicare HMO | Admitting: Cardiovascular Disease

## 2018-10-30 ENCOUNTER — Other Ambulatory Visit: Payer: Self-pay

## 2018-10-30 ENCOUNTER — Encounter: Payer: Self-pay | Admitting: Cardiovascular Disease

## 2018-10-30 DIAGNOSIS — I2510 Atherosclerotic heart disease of native coronary artery without angina pectoris: Secondary | ICD-10-CM

## 2018-10-30 DIAGNOSIS — I10 Essential (primary) hypertension: Secondary | ICD-10-CM

## 2018-10-30 DIAGNOSIS — Z7901 Long term (current) use of anticoagulants: Secondary | ICD-10-CM | POA: Diagnosis not present

## 2018-10-30 DIAGNOSIS — E785 Hyperlipidemia, unspecified: Secondary | ICD-10-CM

## 2018-10-30 DIAGNOSIS — Z9861 Coronary angioplasty status: Secondary | ICD-10-CM | POA: Diagnosis not present

## 2018-10-30 NOTE — Assessment & Plan Note (Signed)
History of CAD status post RCA stenting by myself February 2007 in setting of an inferior STEMI.  He had noncritical disease in his proximal LAD and normal LV function at that time.  He did have re-intervention by Dr. Angelena Form 08/29/2017 with demonstration of a patent proximal LAD stent and 99% stenosis at the Jan you which he stented with a 2.75 mm x 20 mm long drug-eluting stent postdilated to 3 mm.  He has had no recurrent symptoms.  He was on Brilinta initially transitioning to Plavix.

## 2018-10-30 NOTE — Progress Notes (Signed)
10/30/2018 Steven Oneal   [birth year]  [id number]  Primary Physician [employer], MD Primary Cardiologist: Lorretta Harp MD FACP, [place], [place], [place]  HPI:  Steven Oneal is a 73 y.o.  who has I last saw in the office  10/23/2017. He has a history of CAD status post PCI and stenting of his RCA by Dr [name] Feb 2007 in the setting of an MI. He was restudied in June 2008, and had patent stents in his proximal RCA and noncritical disease in his proximal LAD with normal LV function. He has not had a Myoview since and has done well from a cardiac standpoint. He did have a PE/DVT in July 2014 and is on chronic Coumadin. He recently saw [name] after he had palpitations and chest pain Thanksgiving night. The pt says his heart was racing, he was sweaty, and he had pain across his chest. He told me "thought I was having a heart attack". [name] increased his Coreg and the pt stopped his Niaspan suspecting this had something to do with his symptoms, he had been on this since 2008. Since he stopped the Niaspan he has done well. He denies any further chest pain or palpations. His Holter showed PACs, PVCs, and sinus arrythmia with transient bradycardia and sinus pauses up to 2.7 seconds. A Myoview stress test performed 05/08/13 showed inferobasal scar with mild inferolateral hypokinesis unchanged from prior functional studies. Since I saw him last he has remained asymptomatic. He had a uncomplicated penile Implant performed by Dr. Jeffie Pollock . He developed chest pain on 08/29/2017 and was brought emergently to the [facility] where Dr. Angelena Form demonstrated a patent proximal RCA stent, 99% distal RCA at the Jan you and essentially normal left system.  He placed a synergy 2.75 x 20 mm long drug-eluting stent in the distal RCA post dilating with a 3 mm balloon.  He was discharged home on Brilinta and Coumadin with the intent to transition to Plavix in 3 months.   Since I saw him in the  office a year ago he remained stable.  He denies chest pain or shortness of breath..   Current Meds  Medication Sig  . Alpha Lipoic Acid 200 MG CAPS Take 1 capsule by mouth daily.  Marland Kitchen amLODipine (NORVASC) 5 MG tablet TAKE ONE TABLET BY MOUTH ONCE DAILY  . atorvastatin (LIPITOR) 80 MG tablet Take 1 tablet (80 mg total) by mouth daily at 6 PM.  . clopidogrel (PLAVIX) 75 MG tablet Take 1 tablet (75 mg total) by mouth daily.  . diazepam (VALIUM) 10 MG tablet Take 10 mg by mouth at bedtime as needed for anxiety or sleep.   Marland Kitchen dicyclomine (BENTYL) 10 MG capsule Take 1 capsule (10 mg total) by mouth 3 (three) times daily before meals.  . fish oil-omega-3 fatty acids 1000 MG capsule Take 1 g by mouth 3 (three) times daily.   Marland Kitchen levothyroxine (SYNTHROID, LEVOTHROID) 25 MCG tablet Take 25 mcg by mouth daily.  Marland Kitchen losartan (COZAAR) 50 MG tablet TAKE 1 TABLET(50 MG) BY MOUTH DAILY  . meclizine (ANTIVERT) 25 MG tablet Take 25 mg by mouth as needed for dizziness (FOR INNER EAR).   . metoprolol tartrate (LOPRESSOR) 25 MG tablet TAKE 1 TABLET BY MOUTH TWICE DAILY  . Multiple Vitamin (MULTIVITAMIN WITH MINERALS) TABS tablet Take 1 tablet by mouth daily.  . nitroGLYCERIN (NITROSTAT) 0.4 MG SL tablet Place 1 tablet (0.4 mg total) under the tongue every 5 (  five) minutes as needed.  Marland Kitchen omeprazole (PRILOSEC) 20 MG capsule Take 20 mg by mouth every morning.   . tamsulosin (FLOMAX) 0.4 MG CAPS capsule Take 1 capsule (0.4 mg total) by mouth daily.  Marland Kitchen warfarin (COUMADIN) 1 MG tablet Take 1 mg by mouth daily.  Marland Kitchen warfarin (COUMADIN) 3 MG tablet Take 3 mg by mouth daily.     Allergies  Allergen Reactions  . Codeine Hives, Itching and Nausea Only  . Niaspan [Niacin Er] Other (See Comments)    MAKES SKIN FLUSH EXCESSIVELY    Social History   Socioeconomic History  . Marital status: Married    Spouse name: Not on file  . Number of children: 2  . Years of education: Not on file  . Highest education level: Not on file   Occupational History  . Occupation: disablity    Comment: CAD  Social Needs  . Financial resource strain: Not on file  . Food insecurity    Worry: Not on file    Inability: Not on file  . Transportation needs    Medical: Not on file    Non-medical: Not on file  Tobacco Use  . Smoking status: Former Smoker    Packs/day: 0.10    Years: 13.00    Pack years: 1.30    Types: Cigarettes    Quit date: 05/27/1977    Years since quitting: 41.4  . Smokeless tobacco: Former Systems developer    Quit date: 09/08/1968  Substance and Sexual Activity  . Alcohol use: No  . Drug use: No  . Sexual activity: Yes    Partners: Female    Birth control/protection: None  Lifestyle  . Physical activity    Days per week: Not on file    Minutes per session: Not on file  . Stress: Not on file  Relationships  . Social Herbalist on phone: Not on file    Gets together: Not on file    Attends religious service: Not on file    Active member of club or organization: Not on file    Attends meetings of clubs or organizations: Not on file    Relationship status: Not on file  . Intimate partner violence    Fear of current or ex partner: Not on file    Emotionally abused: Not on file    Physically abused: Not on file    Forced sexual activity: Not on file  Other Topics Concern  . Not on file  Social History Narrative  . Not on file     Review of Systems: General: negative for chills, fever, night sweats or weight changes.  Cardiovascular: negative for chest pain, dyspnea on exertion, edema, orthopnea, palpitations, paroxysmal nocturnal dyspnea or shortness of breath Dermatological: negative for rash Respiratory: negative for cough or wheezing Urologic: negative for hematuria Abdominal: negative for nausea, vomiting, diarrhea, bright red blood per rectum, melena, or hematemesis Neurologic: negative for visual changes, syncope, or dizziness All other systems reviewed and are otherwise negative except  as noted above.    Blood pressure 124/68, pulse 88, temperature (!) 97 F (36.1 C), height 6' (1.829 m), weight 228 lb (103.4 kg).  General appearance: alert and no distress Neck: no adenopathy, no carotid bruit, no JVD, supple, symmetrical, trachea midline and thyroid not enlarged, symmetric, no tenderness/mass/nodules Lungs: clear to auscultation bilaterally Heart: regular rate and rhythm, S1, S2 normal, no murmur, click, rub or gallop Extremities: extremities normal, atraumatic, no cyanosis or edema Pulses: 2+  and symmetric Skin: Skin color, texture, turgor normal. No rashes or lesions Neurologic: Alert and oriented X 3, normal strength and tone. Normal symmetric reflexes. Normal coordination and gait  EKG normal sinus rhythm at 88 with left axis deviation.  I personally reviewed this EKG.  ASSESSMENT AND PLAN:   CAD-s/p PCI History of CAD status post RCA stenting by myself February 2007 in setting of an inferior STEMI.  He had noncritical disease in his proximal LAD and normal LV function at that time.  He did have re-intervention by Dr. Angelena Form 08/29/2017 with demonstration of a patent proximal LAD stent and 99% stenosis at the Jan you which he stented with a 2.75 mm x 20 mm long drug-eluting stent postdilated to 3 mm.  He has had no recurrent symptoms.  He was on Brilinta initially transitioning to Plavix.  Dyslipidemia History of hyperlipidemia lipidemia high-dose statin therapy with lipid profile performed 09/24/2018 revealing total cholesterol 115, LDL 21 HDL 35  Chronic anticoagulation for PE, DVT History of remote PE and DVT on chronic Coumadin anticoagulation.  Essential hypertension History of essential hypertension blood pressure measured today 124/68.  Is on amlodipine, losartan and metoprolol.      Lorretta Harp MD FACP,FACC,FAHA, [facility] 10/30/2018 4:59 PM

## 2018-10-30 NOTE — Assessment & Plan Note (Signed)
History of essential hypertension blood pressure measured today 124/68.  Is on amlodipine, losartan and metoprolol.

## 2018-10-30 NOTE — Patient Instructions (Addendum)

## 2018-10-30 NOTE — Assessment & Plan Note (Signed)
History of hyperlipidemia lipidemia high-dose statin therapy with lipid profile performed 09/24/2018 revealing total cholesterol 115, LDL 21 HDL 35

## 2018-10-30 NOTE — Assessment & Plan Note (Signed)
History of remote PE and DVT on chronic Coumadin anticoagulation.

## 2018-11-08 DIAGNOSIS — Z7901 Long term (current) use of anticoagulants: Secondary | ICD-10-CM | POA: Diagnosis not present

## 2018-12-11 ENCOUNTER — Ambulatory Visit: Payer: PRIVATE HEALTH INSURANCE | Admitting: Gastroenterology

## 2018-12-13 DIAGNOSIS — I2699 Other pulmonary embolism without acute cor pulmonale: Secondary | ICD-10-CM | POA: Diagnosis not present

## 2018-12-16 ENCOUNTER — Other Ambulatory Visit: Payer: Self-pay | Admitting: Cardiovascular Disease

## 2018-12-16 DIAGNOSIS — Z9861 Coronary angioplasty status: Secondary | ICD-10-CM

## 2018-12-16 DIAGNOSIS — I2510 Atherosclerotic heart disease of native coronary artery without angina pectoris: Secondary | ICD-10-CM

## 2019-01-03 DIAGNOSIS — Z7901 Long term (current) use of anticoagulants: Secondary | ICD-10-CM | POA: Diagnosis not present

## 2019-01-18 IMAGING — CT CT ABD-PELV W/ CM
2 of 5 series · 16 of 46 positions shown, 18 images · IV contrast (Isovue)
Comparison: Abdominal ultrasound 07/07/2013. CT abdomen and pelvis
10/23/2012.

CLINICAL DATA: Left lower quadrant abdominal pain radiating to the
umbilical region for 6-7 months. Change in bowel habits with
diarrhea.

EXAM:
CT ABDOMEN AND PELVIS WITH CONTRAST
TECHNIQUE: Multidetector CT imaging of the abdomen and pelvis was performed
using the standard protocol following bolus administration of
intravenous contrast.
CONTRAST:  100mL LMQGXE-9PP IOPAMIDOL (LMQGXE-9PP) INJECTION 61%

[Series 2: axial st · axial · 0.87mm/px · z∈[+1001,+1481]mm · 13 of 108 slices shown, 15 images]
[im 6/108  soft-tissue]
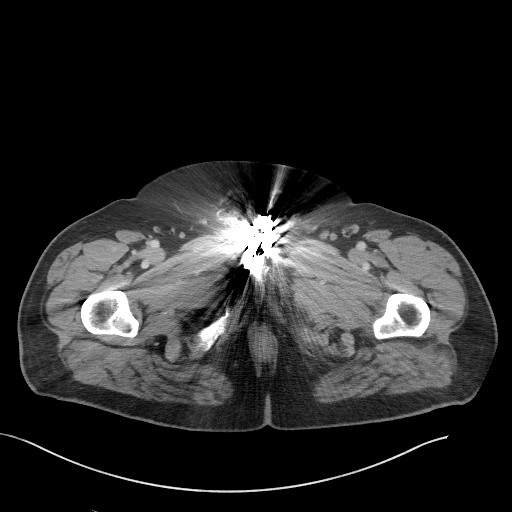
[im 6/108  bone]
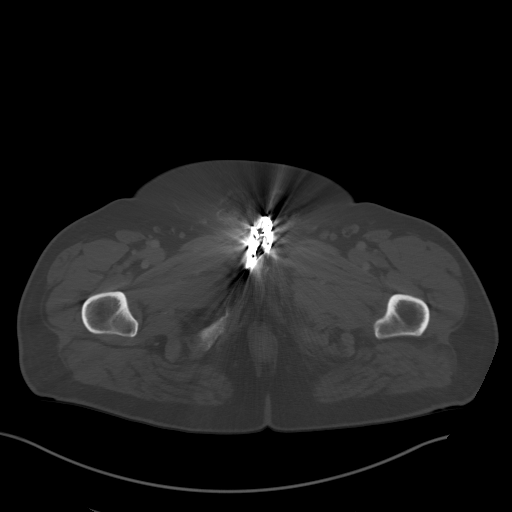
[im 12/108  soft-tissue]
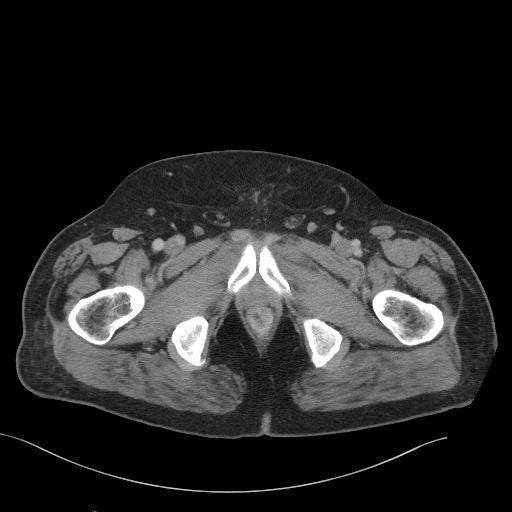
[im 24/108  soft-tissue]
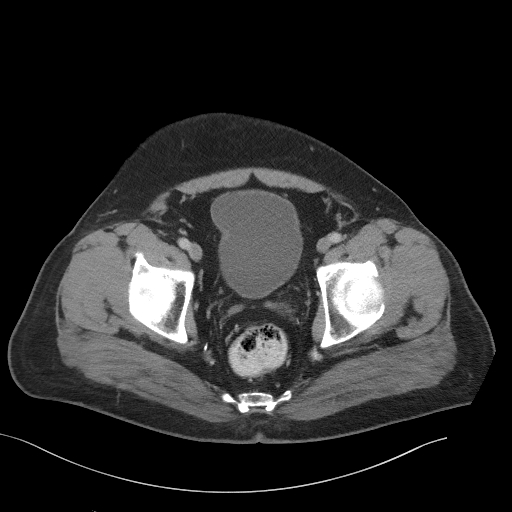
[im 30/108  soft-tissue]
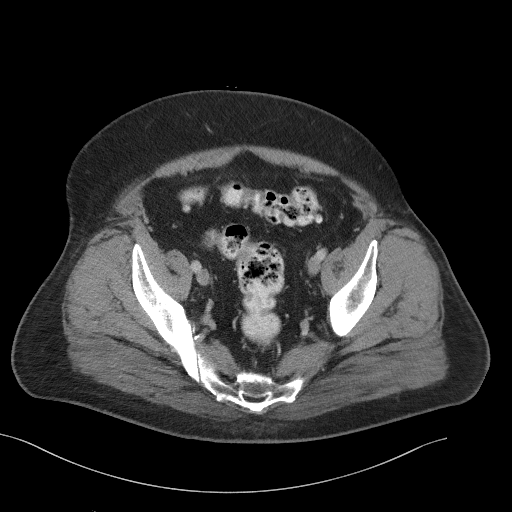
[im 36/108  soft-tissue]
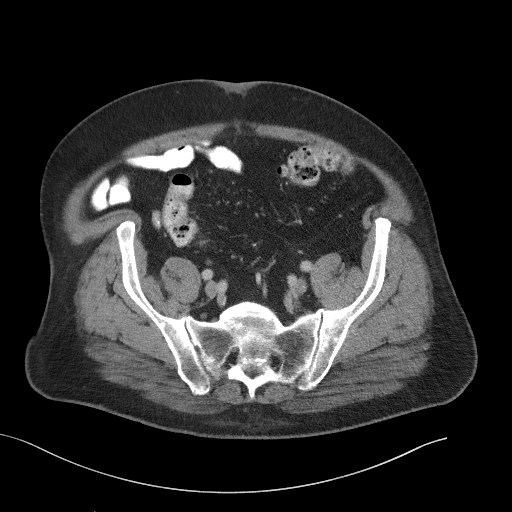
[im 48/108  soft-tissue]
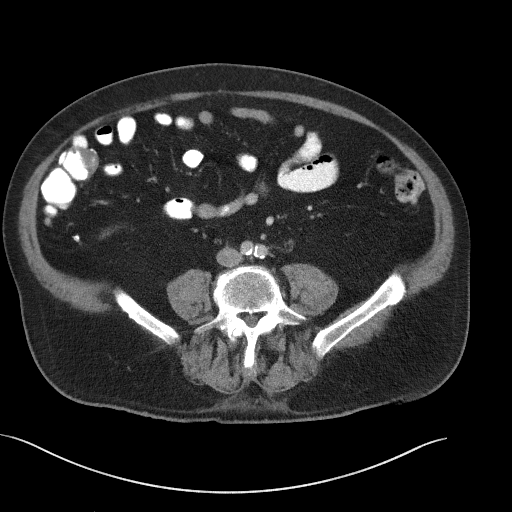
[im 54/108  soft-tissue]
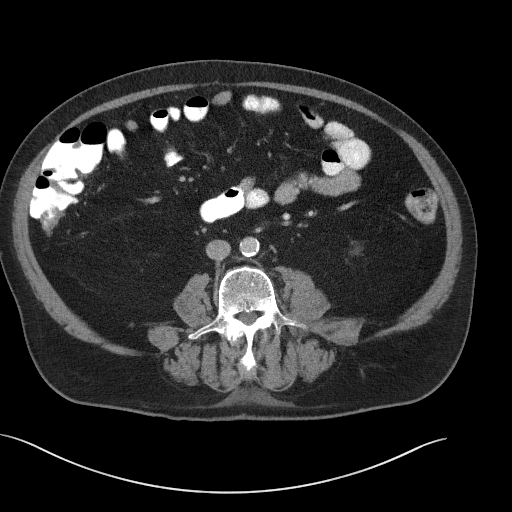
[im 60/108  soft-tissue]
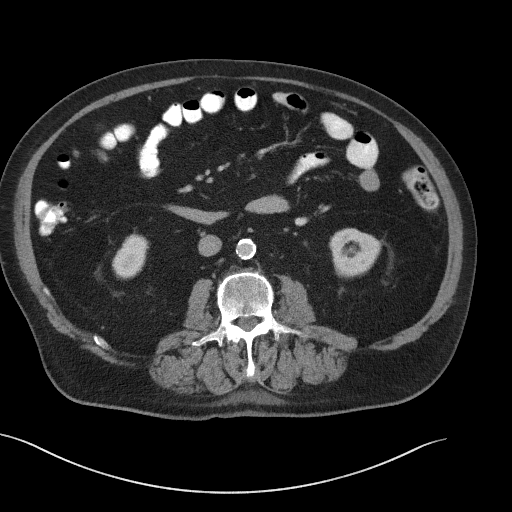
[im 72/108  soft-tissue]
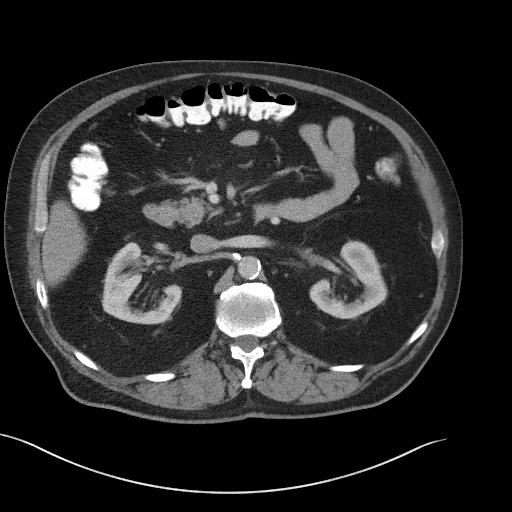
[im 72/108  bone]
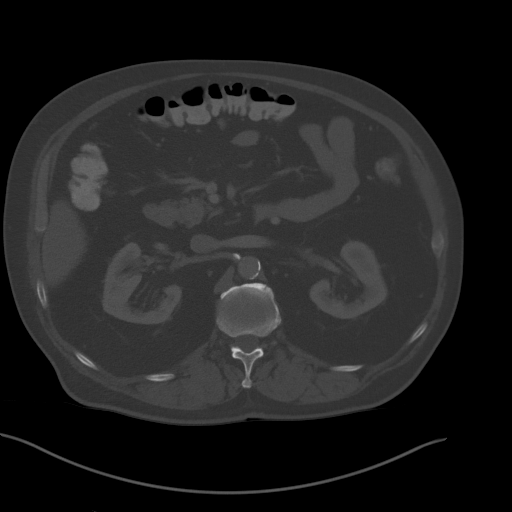
[im 78/108  soft-tissue]
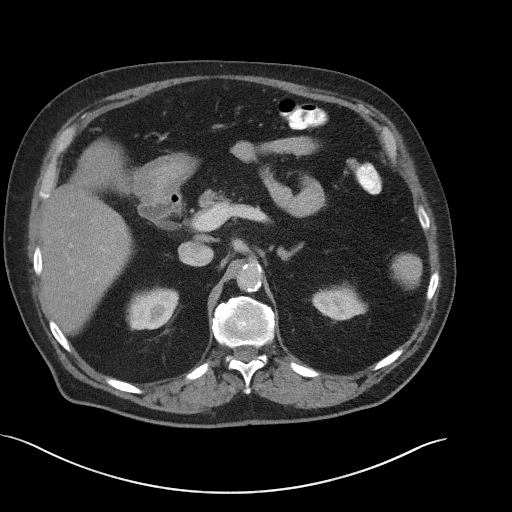
[im 84/108  soft-tissue]
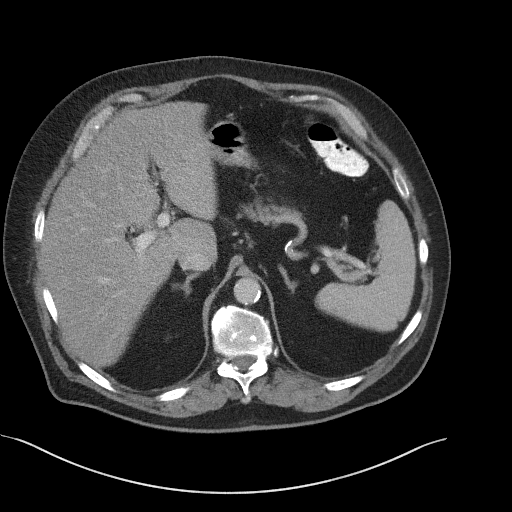
[im 96/108  soft-tissue]
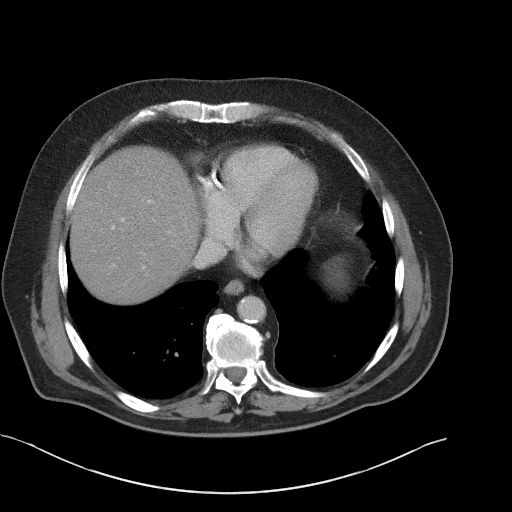
[im 102/108  soft-tissue]
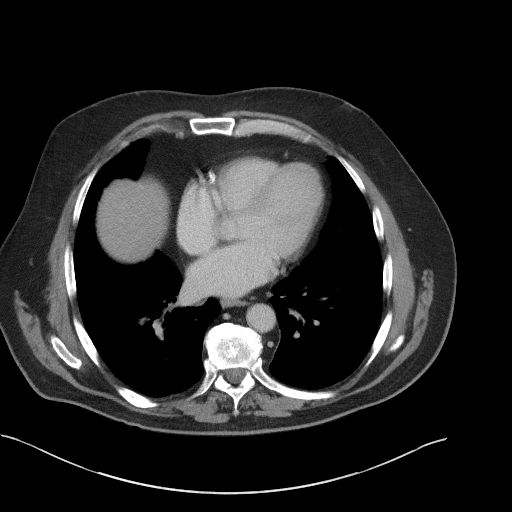

[Series 6: coronal st · coronal · 0.94mm/px · 3 of 117 slices shown]
[im 39/117  soft-tissue]
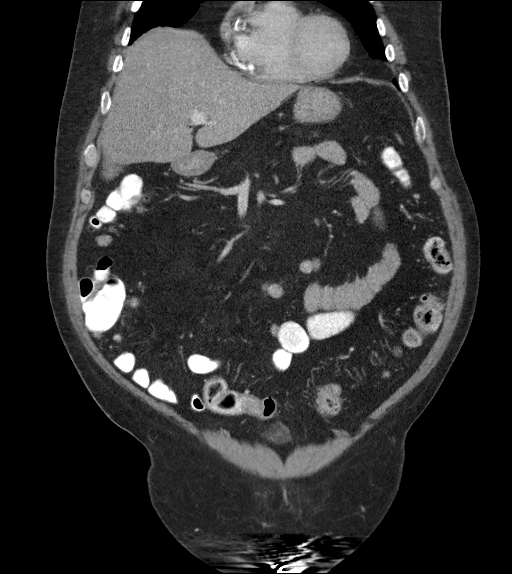
[im 52/117  soft-tissue]
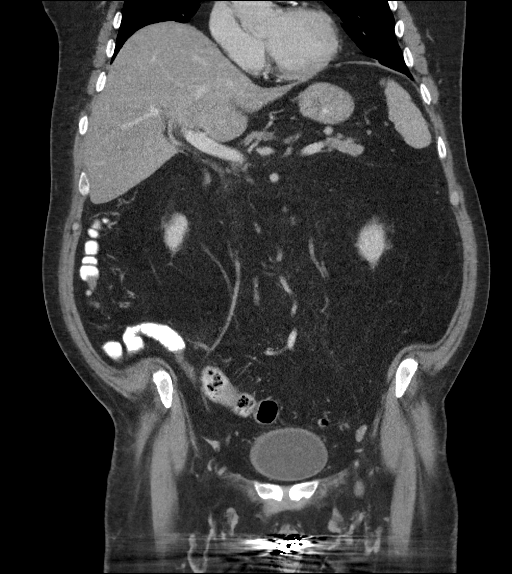
[im 65/117  soft-tissue]
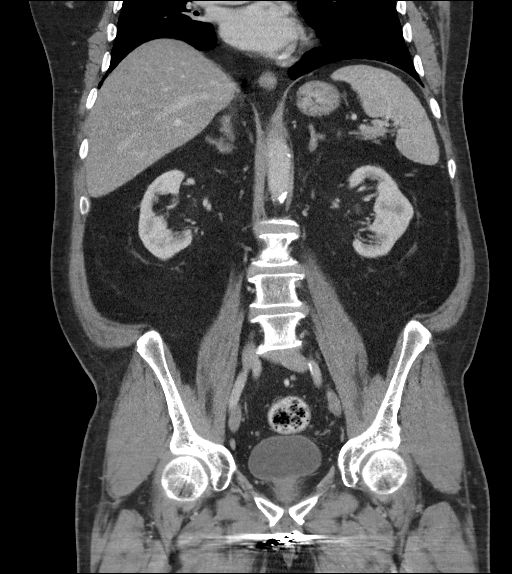

[16 of 46 positions shown; findings below may reference images not displayed]

FINDINGS: Lower chest: Mild respiratory motion and dependent atelectasis in
the lung bases. No pleural effusion. Coronary artery
atherosclerosis.

Hepatobiliary: Diffusely decreased attenuation of the liver
compatible with steatosis. No biliary dilatation status post
interval cholecystectomy.

Pancreas: Unremarkable.

Spleen: Unremarkable.

Adrenals/Urinary Tract: Unchanged 1.3 cm right adrenal nodule, most
likely an adenoma. Unremarkable left adrenal gland. Bilateral renal
cortical thinning. No evidence of renal mass, calculi, or
hydronephrosis. Unremarkable bladder..

Stomach/Bowel: There is no evidence of bowel obstruction. There is
mild diffuse colonic diverticulosis without evidence of
diverticulitis. The appendix is absent.

Vascular/Lymphatic: Abdominal aortic atherosclerosis without
aneurysm. No enlarged lymph nodes.

Reproductive: Unremarkable prostate. Penile implant.

Other: No intraperitoneal free fluid. Small fat containing
paraumbilical hernia.

Musculoskeletal: No acute osseous abnormality or suspicious osseous
lesion.
IMPRESSION: 1. No acute abnormality identified in the abdomen or pelvis.
2. Colonic diverticulosis without evidence of diverticulitis.
3. Hepatic steatosis.

Aortic Atherosclerosis (97CF7-4CD.D).

## 2019-01-22 DIAGNOSIS — E6609 Other obesity due to excess calories: Secondary | ICD-10-CM | POA: Diagnosis not present

## 2019-01-22 DIAGNOSIS — Z6833 Body mass index (BMI) 33.0-33.9, adult: Secondary | ICD-10-CM | POA: Diagnosis not present

## 2019-01-22 DIAGNOSIS — R69 Illness, unspecified: Secondary | ICD-10-CM | POA: Diagnosis not present

## 2019-02-06 DIAGNOSIS — Z7901 Long term (current) use of anticoagulants: Secondary | ICD-10-CM | POA: Diagnosis not present

## 2019-02-06 DIAGNOSIS — Z23 Encounter for immunization: Secondary | ICD-10-CM | POA: Diagnosis not present

## 2019-03-19 DIAGNOSIS — Z7901 Long term (current) use of anticoagulants: Secondary | ICD-10-CM | POA: Diagnosis not present

## 2019-04-02 DIAGNOSIS — Z7901 Long term (current) use of anticoagulants: Secondary | ICD-10-CM | POA: Diagnosis not present

## 2019-04-16 ENCOUNTER — Other Ambulatory Visit: Payer: Self-pay | Admitting: Gastroenterology

## 2019-04-16 DIAGNOSIS — M792 Neuralgia and neuritis, unspecified: Secondary | ICD-10-CM | POA: Diagnosis not present

## 2019-05-29 DIAGNOSIS — Z7901 Long term (current) use of anticoagulants: Secondary | ICD-10-CM | POA: Diagnosis not present

## 2019-06-07 DIAGNOSIS — I739 Peripheral vascular disease, unspecified: Secondary | ICD-10-CM | POA: Diagnosis not present

## 2019-06-07 DIAGNOSIS — G8929 Other chronic pain: Secondary | ICD-10-CM | POA: Diagnosis not present

## 2019-06-07 DIAGNOSIS — E669 Obesity, unspecified: Secondary | ICD-10-CM | POA: Diagnosis not present

## 2019-06-07 DIAGNOSIS — E785 Hyperlipidemia, unspecified: Secondary | ICD-10-CM | POA: Diagnosis not present

## 2019-06-07 DIAGNOSIS — R69 Illness, unspecified: Secondary | ICD-10-CM | POA: Diagnosis not present

## 2019-06-07 DIAGNOSIS — G629 Polyneuropathy, unspecified: Secondary | ICD-10-CM | POA: Diagnosis not present

## 2019-06-07 DIAGNOSIS — H269 Unspecified cataract: Secondary | ICD-10-CM | POA: Diagnosis not present

## 2019-06-07 DIAGNOSIS — E039 Hypothyroidism, unspecified: Secondary | ICD-10-CM | POA: Diagnosis not present

## 2019-06-07 DIAGNOSIS — I10 Essential (primary) hypertension: Secondary | ICD-10-CM | POA: Diagnosis not present

## 2019-07-09 DIAGNOSIS — Z7901 Long term (current) use of anticoagulants: Secondary | ICD-10-CM | POA: Diagnosis not present

## 2019-07-18 DIAGNOSIS — Z0100 Encounter for examination of eyes and vision without abnormal findings: Secondary | ICD-10-CM | POA: Diagnosis not present

## 2019-07-18 DIAGNOSIS — H5200 Hypermetropia, unspecified eye: Secondary | ICD-10-CM | POA: Diagnosis not present

## 2019-08-20 DIAGNOSIS — Z7901 Long term (current) use of anticoagulants: Secondary | ICD-10-CM | POA: Diagnosis not present

## 2019-08-20 DIAGNOSIS — Z1389 Encounter for screening for other disorder: Secondary | ICD-10-CM | POA: Diagnosis not present

## 2019-08-20 DIAGNOSIS — Z6832 Body mass index (BMI) 32.0-32.9, adult: Secondary | ICD-10-CM | POA: Diagnosis not present

## 2019-08-20 DIAGNOSIS — E119 Type 2 diabetes mellitus without complications: Secondary | ICD-10-CM | POA: Diagnosis not present

## 2019-08-20 DIAGNOSIS — E6609 Other obesity due to excess calories: Secondary | ICD-10-CM | POA: Diagnosis not present

## 2019-08-20 DIAGNOSIS — R42 Dizziness and giddiness: Secondary | ICD-10-CM | POA: Diagnosis not present

## 2019-08-26 ENCOUNTER — Other Ambulatory Visit: Payer: Self-pay | Admitting: Cardiovascular Disease

## 2019-08-26 ENCOUNTER — Telehealth: Payer: Self-pay | Admitting: *Deleted

## 2019-08-26 DIAGNOSIS — I2510 Atherosclerotic heart disease of native coronary artery without angina pectoris: Secondary | ICD-10-CM

## 2019-08-26 MED ORDER — CLOPIDOGREL BISULFATE 75 MG PO TABS: 75.0000 mg | ORAL_TABLET | Freq: Every day | ORAL | 0 refills | Status: DC

## 2019-08-26 NOTE — Telephone Encounter (Signed)
*  STAT* If patient is at the pharmacy, call can be transferred to refill team.   1. Which medications need to be refilled? (please list name of each medication and dose if known)  clopidogrel (PLAVIX) 75 MG tablet  2. Which pharmacy/location (including street and city if local pharmacy) is medication to be sent to? [facility] [place]   3. Do they need a 30 day or 90 day supply? 90 day supply

## 2019-08-26 NOTE — Telephone Encounter (Signed)
Rx(s) sent to pharmacy electronically.  

## 2019-08-26 NOTE — Telephone Encounter (Signed)
[facility] called to let us know that pt is using their pharmacy now.  They want [place] to send in RX for Dicyclomine for 3 month supply.

## 2019-08-27 ENCOUNTER — Telehealth: Payer: Self-pay | Admitting: Internal Medicine

## 2019-08-27 DIAGNOSIS — Z7901 Long term (current) use of anticoagulants: Secondary | ICD-10-CM | POA: Diagnosis not present

## 2019-08-27 NOTE — Telephone Encounter (Signed)
Message was previously sent to [facility] Refill box 08/26/19.

## 2019-08-27 NOTE — Telephone Encounter (Signed)
PATIENT NEEDS HIS DICYCLAMINE SENT TO [facility]

## 2019-08-28 ENCOUNTER — Other Ambulatory Visit: Payer: Self-pay | Admitting: Gastroenterology

## 2019-08-28 MED ORDER — DICYCLOMINE HCL 10 MG PO CAPS: 10.0000 mg | ORAL_CAPSULE | Freq: Three times a day (TID) | ORAL | 3 refills | Status: DC

## 2019-08-28 NOTE — Telephone Encounter (Signed)
[place]. Pt was due for delivery of his Bentyl. They called to see if they could get an approval of medication. Pt will be out of his medication tomorrow.

## 2019-08-28 NOTE — Telephone Encounter (Signed)
Rx sent to pharmacy   

## 2019-08-29 DIAGNOSIS — I2699 Other pulmonary embolism without acute cor pulmonale: Secondary | ICD-10-CM | POA: Diagnosis not present

## 2019-08-29 DIAGNOSIS — I10 Essential (primary) hypertension: Secondary | ICD-10-CM | POA: Diagnosis not present

## 2019-08-29 DIAGNOSIS — I2510 Atherosclerotic heart disease of native coronary artery without angina pectoris: Secondary | ICD-10-CM | POA: Diagnosis not present

## 2019-08-29 DIAGNOSIS — I700 Atherosclerosis of aorta: Secondary | ICD-10-CM | POA: Diagnosis not present

## 2019-08-29 NOTE — Telephone Encounter (Signed)
Noted  

## 2019-09-10 DIAGNOSIS — Z7901 Long term (current) use of anticoagulants: Secondary | ICD-10-CM | POA: Diagnosis not present

## 2019-09-19 DIAGNOSIS — M545 Low back pain: Secondary | ICD-10-CM | POA: Diagnosis not present

## 2019-09-19 DIAGNOSIS — M9902 Segmental and somatic dysfunction of thoracic region: Secondary | ICD-10-CM | POA: Diagnosis not present

## 2019-09-19 DIAGNOSIS — M546 Pain in thoracic spine: Secondary | ICD-10-CM | POA: Diagnosis not present

## 2019-09-19 DIAGNOSIS — M9905 Segmental and somatic dysfunction of pelvic region: Secondary | ICD-10-CM | POA: Diagnosis not present

## 2019-09-19 DIAGNOSIS — M9903 Segmental and somatic dysfunction of lumbar region: Secondary | ICD-10-CM | POA: Diagnosis not present

## 2019-09-22 DIAGNOSIS — M9903 Segmental and somatic dysfunction of lumbar region: Secondary | ICD-10-CM | POA: Diagnosis not present

## 2019-09-22 DIAGNOSIS — M546 Pain in thoracic spine: Secondary | ICD-10-CM | POA: Diagnosis not present

## 2019-09-22 DIAGNOSIS — M9905 Segmental and somatic dysfunction of pelvic region: Secondary | ICD-10-CM | POA: Diagnosis not present

## 2019-09-22 DIAGNOSIS — M545 Low back pain: Secondary | ICD-10-CM | POA: Diagnosis not present

## 2019-09-22 DIAGNOSIS — M9902 Segmental and somatic dysfunction of thoracic region: Secondary | ICD-10-CM | POA: Diagnosis not present

## 2019-09-24 DIAGNOSIS — Z7901 Long term (current) use of anticoagulants: Secondary | ICD-10-CM | POA: Diagnosis not present

## 2019-09-26 DIAGNOSIS — M546 Pain in thoracic spine: Secondary | ICD-10-CM | POA: Diagnosis not present

## 2019-09-26 DIAGNOSIS — M9903 Segmental and somatic dysfunction of lumbar region: Secondary | ICD-10-CM | POA: Diagnosis not present

## 2019-09-26 DIAGNOSIS — M545 Low back pain: Secondary | ICD-10-CM | POA: Diagnosis not present

## 2019-09-26 DIAGNOSIS — M9902 Segmental and somatic dysfunction of thoracic region: Secondary | ICD-10-CM | POA: Diagnosis not present

## 2019-09-26 DIAGNOSIS — M9905 Segmental and somatic dysfunction of pelvic region: Secondary | ICD-10-CM | POA: Diagnosis not present

## 2019-10-02 DIAGNOSIS — I2510 Atherosclerotic heart disease of native coronary artery without angina pectoris: Secondary | ICD-10-CM | POA: Diagnosis not present

## 2019-10-02 DIAGNOSIS — E7849 Other hyperlipidemia: Secondary | ICD-10-CM | POA: Diagnosis not present

## 2019-10-02 DIAGNOSIS — Z125 Encounter for screening for malignant neoplasm of prostate: Secondary | ICD-10-CM | POA: Diagnosis not present

## 2019-10-02 DIAGNOSIS — I2699 Other pulmonary embolism without acute cor pulmonale: Secondary | ICD-10-CM | POA: Diagnosis not present

## 2019-10-02 DIAGNOSIS — E291 Testicular hypofunction: Secondary | ICD-10-CM | POA: Diagnosis not present

## 2019-10-02 DIAGNOSIS — Z0001 Encounter for general adult medical examination with abnormal findings: Secondary | ICD-10-CM | POA: Diagnosis not present

## 2019-10-02 DIAGNOSIS — I10 Essential (primary) hypertension: Secondary | ICD-10-CM | POA: Diagnosis not present

## 2019-10-02 DIAGNOSIS — I700 Atherosclerosis of aorta: Secondary | ICD-10-CM | POA: Diagnosis not present

## 2019-10-02 DIAGNOSIS — Z7689 Persons encountering health services in other specified circumstances: Secondary | ICD-10-CM | POA: Diagnosis not present

## 2019-10-02 DIAGNOSIS — Z1389 Encounter for screening for other disorder: Secondary | ICD-10-CM | POA: Diagnosis not present

## 2019-10-02 DIAGNOSIS — Z6832 Body mass index (BMI) 32.0-32.9, adult: Secondary | ICD-10-CM | POA: Diagnosis not present

## 2019-10-02 DIAGNOSIS — R69 Illness, unspecified: Secondary | ICD-10-CM | POA: Diagnosis not present

## 2019-10-02 DIAGNOSIS — E039 Hypothyroidism, unspecified: Secondary | ICD-10-CM | POA: Diagnosis not present

## 2019-10-02 DIAGNOSIS — E6609 Other obesity due to excess calories: Secondary | ICD-10-CM | POA: Diagnosis not present

## 2019-10-02 DIAGNOSIS — E119 Type 2 diabetes mellitus without complications: Secondary | ICD-10-CM | POA: Diagnosis not present

## 2019-10-29 DIAGNOSIS — I700 Atherosclerosis of aorta: Secondary | ICD-10-CM | POA: Diagnosis not present

## 2019-10-29 DIAGNOSIS — I10 Essential (primary) hypertension: Secondary | ICD-10-CM | POA: Diagnosis not present

## 2019-10-29 DIAGNOSIS — I2510 Atherosclerotic heart disease of native coronary artery without angina pectoris: Secondary | ICD-10-CM | POA: Diagnosis not present

## 2019-10-29 DIAGNOSIS — I2699 Other pulmonary embolism without acute cor pulmonale: Secondary | ICD-10-CM | POA: Diagnosis not present

## 2019-11-18 DIAGNOSIS — Z7901 Long term (current) use of anticoagulants: Secondary | ICD-10-CM | POA: Diagnosis not present

## 2019-11-25 ENCOUNTER — Encounter: Payer: Self-pay | Admitting: Internal Medicine

## 2019-11-25 ENCOUNTER — Telehealth: Payer: Self-pay | Admitting: Internal Medicine

## 2019-11-25 MED ORDER — DICYCLOMINE HCL 10 MG PO CAPS: 10.0000 mg | ORAL_CAPSULE | Freq: Three times a day (TID) | ORAL | 3 refills | Status: DC

## 2019-11-25 NOTE — Telephone Encounter (Signed)
Needs office visit for further refills. I have refilled for now.

## 2019-11-25 NOTE — Telephone Encounter (Signed)
Pt asked for [place] to send his Rx dicyclomine 10 mg to [facility] in [place].

## 2019-11-25 NOTE — Telephone Encounter (Signed)
Routing to [facility] Refill box. Dicyclomine 10 mg 3 times daily.

## 2019-11-25 NOTE — Telephone Encounter (Signed)
Sent letter that patient needed to call and make appointment

## 2019-12-17 ENCOUNTER — Other Ambulatory Visit: Payer: Self-pay | Admitting: Cardiovascular Disease

## 2019-12-17 DIAGNOSIS — Z9861 Coronary angioplasty status: Secondary | ICD-10-CM

## 2019-12-17 DIAGNOSIS — Z7901 Long term (current) use of anticoagulants: Secondary | ICD-10-CM | POA: Diagnosis not present

## 2019-12-17 DIAGNOSIS — I2510 Atherosclerotic heart disease of native coronary artery without angina pectoris: Secondary | ICD-10-CM

## 2019-12-17 NOTE — Telephone Encounter (Signed)
Rx has been sent to the pharmacy electronically. ° °

## 2020-01-01 DIAGNOSIS — R69 Illness, unspecified: Secondary | ICD-10-CM | POA: Diagnosis not present

## 2020-01-22 DIAGNOSIS — Z7901 Long term (current) use of anticoagulants: Secondary | ICD-10-CM | POA: Diagnosis not present

## 2020-01-29 DIAGNOSIS — E6609 Other obesity due to excess calories: Secondary | ICD-10-CM | POA: Diagnosis not present

## 2020-01-29 DIAGNOSIS — E7849 Other hyperlipidemia: Secondary | ICD-10-CM | POA: Diagnosis not present

## 2020-01-29 DIAGNOSIS — I10 Essential (primary) hypertension: Secondary | ICD-10-CM | POA: Diagnosis not present

## 2020-02-17 ENCOUNTER — Other Ambulatory Visit: Payer: Self-pay | Admitting: Cardiovascular Disease

## 2020-02-17 DIAGNOSIS — I2510 Atherosclerotic heart disease of native coronary artery without angina pectoris: Secondary | ICD-10-CM

## 2020-02-17 DIAGNOSIS — Z9861 Coronary angioplasty status: Secondary | ICD-10-CM

## 2020-02-19 DIAGNOSIS — Z7901 Long term (current) use of anticoagulants: Secondary | ICD-10-CM | POA: Diagnosis not present

## 2020-02-19 DIAGNOSIS — Z23 Encounter for immunization: Secondary | ICD-10-CM | POA: Diagnosis not present

## 2020-02-28 DIAGNOSIS — I10 Essential (primary) hypertension: Secondary | ICD-10-CM | POA: Diagnosis not present

## 2020-02-28 DIAGNOSIS — E6609 Other obesity due to excess calories: Secondary | ICD-10-CM | POA: Diagnosis not present

## 2020-02-28 DIAGNOSIS — E7849 Other hyperlipidemia: Secondary | ICD-10-CM | POA: Diagnosis not present

## 2020-03-17 ENCOUNTER — Telehealth: Payer: Self-pay

## 2020-03-17 NOTE — Telephone Encounter (Signed)
Received a letter from [facility] asking for more information about filling Dicyclomine. Pt was asked to schedule an appointment for additional refills 10/2019. Scanning letter in chart.

## 2020-03-19 ENCOUNTER — Other Ambulatory Visit: Payer: Self-pay | Admitting: Cardiovascular Disease

## 2020-03-19 DIAGNOSIS — Z9861 Coronary angioplasty status: Secondary | ICD-10-CM

## 2020-03-19 DIAGNOSIS — I2510 Atherosclerotic heart disease of native coronary artery without angina pectoris: Secondary | ICD-10-CM

## 2020-03-29 DIAGNOSIS — Z7901 Long term (current) use of anticoagulants: Secondary | ICD-10-CM | POA: Diagnosis not present

## 2020-03-30 DIAGNOSIS — E7849 Other hyperlipidemia: Secondary | ICD-10-CM | POA: Diagnosis not present

## 2020-03-30 DIAGNOSIS — I10 Essential (primary) hypertension: Secondary | ICD-10-CM | POA: Diagnosis not present

## 2020-03-30 DIAGNOSIS — E6609 Other obesity due to excess calories: Secondary | ICD-10-CM | POA: Diagnosis not present

## 2020-04-05 DIAGNOSIS — Z6831 Body mass index (BMI) 31.0-31.9, adult: Secondary | ICD-10-CM | POA: Diagnosis not present

## 2020-04-05 DIAGNOSIS — E6609 Other obesity due to excess calories: Secondary | ICD-10-CM | POA: Diagnosis not present

## 2020-04-05 DIAGNOSIS — R42 Dizziness and giddiness: Secondary | ICD-10-CM | POA: Diagnosis not present

## 2020-04-05 DIAGNOSIS — R7309 Other abnormal glucose: Secondary | ICD-10-CM | POA: Diagnosis not present

## 2020-05-04 DIAGNOSIS — E785 Hyperlipidemia, unspecified: Secondary | ICD-10-CM | POA: Diagnosis not present

## 2020-05-04 DIAGNOSIS — I252 Old myocardial infarction: Secondary | ICD-10-CM | POA: Diagnosis not present

## 2020-05-04 DIAGNOSIS — R69 Illness, unspecified: Secondary | ICD-10-CM | POA: Diagnosis not present

## 2020-05-04 DIAGNOSIS — I10 Essential (primary) hypertension: Secondary | ICD-10-CM | POA: Diagnosis not present

## 2020-05-04 DIAGNOSIS — E669 Obesity, unspecified: Secondary | ICD-10-CM | POA: Diagnosis not present

## 2020-05-04 DIAGNOSIS — I2510 Atherosclerotic heart disease of native coronary artery without angina pectoris: Secondary | ICD-10-CM | POA: Diagnosis not present

## 2020-05-04 DIAGNOSIS — E039 Hypothyroidism, unspecified: Secondary | ICD-10-CM | POA: Diagnosis not present

## 2020-05-04 DIAGNOSIS — G629 Polyneuropathy, unspecified: Secondary | ICD-10-CM | POA: Diagnosis not present

## 2020-05-04 DIAGNOSIS — Z6836 Body mass index (BMI) 36.0-36.9, adult: Secondary | ICD-10-CM | POA: Diagnosis not present

## 2020-05-04 DIAGNOSIS — K219 Gastro-esophageal reflux disease without esophagitis: Secondary | ICD-10-CM | POA: Diagnosis not present

## 2020-05-05 DIAGNOSIS — Z7901 Long term (current) use of anticoagulants: Secondary | ICD-10-CM | POA: Diagnosis not present

## 2020-05-29 DIAGNOSIS — E6609 Other obesity due to excess calories: Secondary | ICD-10-CM | POA: Diagnosis not present

## 2020-05-29 DIAGNOSIS — E782 Mixed hyperlipidemia: Secondary | ICD-10-CM | POA: Diagnosis not present

## 2020-05-29 DIAGNOSIS — I10 Essential (primary) hypertension: Secondary | ICD-10-CM | POA: Diagnosis not present

## 2020-06-10 DIAGNOSIS — Z7901 Long term (current) use of anticoagulants: Secondary | ICD-10-CM | POA: Diagnosis not present

## 2020-06-27 ENCOUNTER — Other Ambulatory Visit: Payer: Self-pay | Admitting: Cardiovascular Disease

## 2020-06-27 DIAGNOSIS — I2510 Atherosclerotic heart disease of native coronary artery without angina pectoris: Secondary | ICD-10-CM

## 2020-07-06 DIAGNOSIS — E1140 Type 2 diabetes mellitus with diabetic neuropathy, unspecified: Secondary | ICD-10-CM | POA: Diagnosis not present

## 2020-07-06 DIAGNOSIS — E119 Type 2 diabetes mellitus without complications: Secondary | ICD-10-CM | POA: Diagnosis not present

## 2020-07-06 DIAGNOSIS — Z7901 Long term (current) use of anticoagulants: Secondary | ICD-10-CM | POA: Diagnosis not present

## 2020-07-06 DIAGNOSIS — Z1331 Encounter for screening for depression: Secondary | ICD-10-CM | POA: Diagnosis not present

## 2020-07-06 DIAGNOSIS — R0602 Shortness of breath: Secondary | ICD-10-CM | POA: Diagnosis not present

## 2020-07-06 DIAGNOSIS — I2699 Other pulmonary embolism without acute cor pulmonale: Secondary | ICD-10-CM | POA: Diagnosis not present

## 2020-07-06 DIAGNOSIS — I700 Atherosclerosis of aorta: Secondary | ICD-10-CM | POA: Diagnosis not present

## 2020-07-06 DIAGNOSIS — E6609 Other obesity due to excess calories: Secondary | ICD-10-CM | POA: Diagnosis not present

## 2020-07-06 DIAGNOSIS — Z6832 Body mass index (BMI) 32.0-32.9, adult: Secondary | ICD-10-CM | POA: Diagnosis not present

## 2020-07-07 ENCOUNTER — Encounter: Payer: Self-pay | Admitting: Internal Medicine

## 2020-07-19 DIAGNOSIS — H5203 Hypermetropia, bilateral: Secondary | ICD-10-CM | POA: Diagnosis not present

## 2020-07-20 ENCOUNTER — Other Ambulatory Visit: Payer: Self-pay | Admitting: Cardiovascular Disease

## 2020-07-20 DIAGNOSIS — I2510 Atherosclerotic heart disease of native coronary artery without angina pectoris: Secondary | ICD-10-CM

## 2020-07-28 DIAGNOSIS — E6609 Other obesity due to excess calories: Secondary | ICD-10-CM | POA: Diagnosis not present

## 2020-07-28 DIAGNOSIS — E7849 Other hyperlipidemia: Secondary | ICD-10-CM | POA: Diagnosis not present

## 2020-07-28 DIAGNOSIS — I10 Essential (primary) hypertension: Secondary | ICD-10-CM | POA: Diagnosis not present

## 2020-08-12 ENCOUNTER — Encounter: Payer: Self-pay | Admitting: Internal Medicine

## 2020-08-12 ENCOUNTER — Telehealth: Payer: Self-pay | Admitting: Internal Medicine

## 2020-08-12 NOTE — Telephone Encounter (Signed)
Pt needs ov due to increased sedation with last tcs and age.

## 2020-08-12 NOTE — Telephone Encounter (Signed)
NV or OV?

## 2020-08-12 NOTE — Telephone Encounter (Signed)
OV made and appt letter mailed °

## 2020-08-18 ENCOUNTER — Other Ambulatory Visit: Payer: Self-pay | Admitting: Cardiovascular Disease

## 2020-08-18 DIAGNOSIS — I2510 Atherosclerotic heart disease of native coronary artery without angina pectoris: Secondary | ICD-10-CM

## 2020-08-18 DIAGNOSIS — Z9861 Coronary angioplasty status: Secondary | ICD-10-CM

## 2020-08-23 DIAGNOSIS — Z7901 Long term (current) use of anticoagulants: Secondary | ICD-10-CM | POA: Diagnosis not present

## 2020-08-30 ENCOUNTER — Telehealth: Payer: Self-pay

## 2020-08-30 NOTE — Telephone Encounter (Signed)
PA for Dicyclomine has been approved for 05-01-20 through 04/30/2021. Approval letter will be scanned in pts results.

## 2020-09-09 ENCOUNTER — Other Ambulatory Visit: Payer: Self-pay | Admitting: Cardiovascular Disease

## 2020-09-09 DIAGNOSIS — I2510 Atherosclerotic heart disease of native coronary artery without angina pectoris: Secondary | ICD-10-CM

## 2020-09-29 DIAGNOSIS — Z7901 Long term (current) use of anticoagulants: Secondary | ICD-10-CM | POA: Diagnosis not present

## 2020-10-04 ENCOUNTER — Other Ambulatory Visit: Payer: Self-pay | Admitting: Cardiovascular Disease

## 2020-10-04 DIAGNOSIS — Z9861 Coronary angioplasty status: Secondary | ICD-10-CM

## 2020-10-04 DIAGNOSIS — I2510 Atherosclerotic heart disease of native coronary artery without angina pectoris: Secondary | ICD-10-CM

## 2020-10-05 ENCOUNTER — Other Ambulatory Visit: Payer: Self-pay

## 2020-10-05 ENCOUNTER — Ambulatory Visit (INDEPENDENT_AMBULATORY_CARE_PROVIDER_SITE_OTHER): Payer: Medicare HMO | Admitting: Internal Medicine

## 2020-10-05 ENCOUNTER — Encounter: Payer: Self-pay | Admitting: Internal Medicine

## 2020-10-05 VITALS — BP 126/70 | HR 77 | Temp 97.1°F | Ht 72.0 in | Wt 231.0 lb

## 2020-10-05 DIAGNOSIS — Z86010 Personal history of colonic polyps: Secondary | ICD-10-CM

## 2020-10-05 DIAGNOSIS — Z800 Family history of malignant neoplasm of digestive organs: Secondary | ICD-10-CM

## 2020-10-05 DIAGNOSIS — K219 Gastro-esophageal reflux disease without esophagitis: Secondary | ICD-10-CM

## 2020-10-05 MED ORDER — OMEPRAZOLE 40 MG PO CPDR: 40.0000 mg | DELAYED_RELEASE_CAPSULE | Freq: Every day | ORAL | 11 refills | Status: DC

## 2020-10-05 NOTE — Patient Instructions (Signed)
As discussed, we will set up a surveillance colonoscopy (history of colonic polyps and positive family history colon cancer) ASA 3/propofol  Patient will need to hold Coumadin for 3 days prior to the procedure.  Patient will need bridging with Lovenox.  This needs to be orchestrated through Dr. Naida Sleight office.  Stop pantoprazole/Protonix  Begin omeprazole/Prilosec 40 mg once daily-taken each morning before breakfast.  Dispense 30 with 11 refills.  Further recommendations to follow after colonoscopy has been performed.

## 2020-10-05 NOTE — Progress Notes (Signed)
Primary Care Physician:  Redmond School, MD Primary Gastroenterologist:  Dr. Gala Romney  Pre-Procedure History & Physical: HPI:  Steven Oneal is a 75 y.o. male here for consideration of a surveillance colonoscopy.  Patient with a history of very small adenomas removed 2012 and 2017.  Striking family history of colon cancer in his mother, brother as well as 2 uncles (1 paternal and 1 maternal).  In addition, he had another brother that died with intra-abdominal carcinomatosis-primary unknown. These days patient has done very well from a GI standpoint.  Denies melena, rectal bleeding;  no abdominal pain; he does have reflux; no dysphagia.  He was switched to Protonix about a year ago  -   does not work as well as omeprazole 40 mg daily.  History of STEMI with stents placed 3 years ago.  Is off Plavix now.  Due to DVT and PE he is on chronic anticoagulation with warfarin.  He continues to enjoy a good quality of life; he likes working with his vintage car.  He is spending a lot of time taking care of his wife with dementia nowadays.  Past Medical History:  Diagnosis Date  . Allergic rhinitis   . Anticoagulated on Coumadin   . CAD (coronary artery disease) cardiologist-  dr berry   DES Cypher x 2 mid & ostial RCA 2007; Repeat LHC 06/2014 angiographically minimal CAD with widely patent RCA stents EF 60-65% 08/29/17 STEMI with PCI/DES x1 to mRCA  . Dyslipidemia   . ED (erectile dysfunction)    vascular  . Essential hypertension   . First degree heart block   . GERD (gastroesophageal reflux disease)   . Hiatal hernia   . History of adenomatous polyp of colon    tubular adenoma 08-10-2010  . History of DVT (deep vein thrombosis) 11/08/2012   LLE  . History of pulmonary embolus (PE) 11/08/2012   LLL  . History of ST elevation myocardial infarction (STEMI) 06/15/2005   inferior wall stemi-- s/p  pci and des x2 to rca  . Nocturia   . PAC (premature atrial contraction)   . S/P drug eluting  coronary stent placement 06/15/2005   to mid and ostial RCA  . Vertigo   . Wears dentures    upper denture and lower partial    Past Surgical History:  Procedure Laterality Date  . CARDIAC CATHETERIZATION  10/09/2005   Patent stents, medical therapy  . CARDIAC CATHETERIZATION  10/22/2006   Nonobstructive disease, 60% LAD, patent RCA stents  . CARDIOVASCULAR STRESS TEST  05-08-2013  dr berry   Low risk nuclear perfusion study w/ basal inferior wal nontransmural infarction and no evidence ischemia/   normal LV function and mild basal inferior wall hypokinesis,  ef 65%  . CHOLECYSTECTOMY N/A 07/14/2013   Procedure: LAPAROSCOPIC CHOLECYSTECTOMY;  Surgeon: Jamesetta So, MD;  Location: [facility];  Service: General;  Laterality: N/A;  . COLONOSCOPY N/A 08/03/2015   Dr. Gala Romney: diverticulosis in entire colon, one 4 mm tubular adenoma, 5 year surveillance  . CORONARY ANGIOPLASTY WITH STENT PLACEMENT  06/15/2005  dr Tami Ribas   Cypher stent to mid RCA; cypher stent -ostial RCA, RESIDUAL disease LAD  . CORONARY/GRAFT ACUTE MI REVASCULARIZATION N/A 08/29/2017   Procedure: Coronary/Graft Acute MI Revascularization;  Surgeon: Burnell Blanks, MD;  Location: [place] [facility];  Service: Cardiovascular;  Laterality: N/A;  . ESOPHAGOGASTRODUODENOSCOPY N/A 08/03/2015   Dr. Gala Romney: small hiatal hernia, otherwise normal  . LAPAROSCOPIC APPENDECTOMY  03/10/2008  .  LEFT HEART CATH AND CORONARY ANGIOGRAPHY N/A 08/29/2017   Procedure: LEFT HEART CATH AND CORONARY ANGIOGRAPHY;  Surgeon: Burnell Blanks, MD;  Location: [place] [facility];  Service: Cardiovascular;  Laterality: N/A;  . LEFT HEART CATHETERIZATION WITH CORONARY ANGIOGRAM N/A 06/12/2014   Procedure: LEFT HEART CATHETERIZATION WITH CORONARY ANGIOGRAM;  Surgeon: Leonie Man, MD;  Location: [facility];  Service: Cardiovascular;  Laterality: N/A;  Minimal CAD w/ widely patent RCA stents;  well-preserved LVEF w/ normal EDP   . PENILE PROSTHESIS  IMPLANT N/A 09/19/2016   Procedure: INSERTION SEMI-RIGID PENILE PROSTHESIS COLOPLAST;  Surgeon: Irine Seal, MD;  Location: [facility];  Service: Urology;  Laterality: N/A;    Prior to Admission medications   Medication Sig Start Date End Date Taking? Authorizing Provider  amLODipine (NORVASC) 5 MG tablet TAKE ONE TABLET BY MOUTH ONCE DAILY 01/03/17  Yes Lorretta Harp, MD  atorvastatin (LIPITOR) 80 MG tablet Take 1 tablet (80 mg total) by mouth daily at 6 PM. 08/31/17  Yes Cheryln Manly, NP  cholecalciferol (VITAMIN D3) 25 MCG (1000 UNIT) tablet Take 1,000 Units by mouth daily.   Yes [provider]  diazepam (VALIUM) 10 MG tablet Take 10 mg by mouth at bedtime as needed for anxiety or sleep. Sometimes takes twice a day   Yes [provider]  dicyclomine (BENTYL) 10 MG capsule Take 1 capsule (10 mg total) by mouth 3 (three) times daily before meals. 11/25/19  Yes Annitta Needs, NP  fish oil-omega-3 fatty acids 1000 MG capsule Take 1 g by mouth 3 (three) times daily.   Yes [provider]  gabapentin (NEURONTIN) 100 MG capsule Take 100 mg by mouth 3 (three) times daily. 08/13/20  Yes [provider]  levothyroxine (SYNTHROID) 50 MCG tablet Take 50 mcg by mouth daily. 08/18/20  Yes [provider]  losartan (COZAAR) 50 MG tablet TAKE 1 TABLET(50 MG) BY MOUTH DAILY 10/02/17  Yes Lorretta Harp, MD  meclizine (ANTIVERT) 25 MG tablet Take 25 mg by mouth as needed for dizziness (FOR INNER EAR).  05/13/13  Yes [provider]  metoprolol tartrate (LOPRESSOR) 25 MG tablet TAKE 1 TABLET BY MOUTH TWICE DAILY Patient taking differently: Take 25 mg by mouth daily. 10/02/17  Yes Lorretta Harp, MD  Multiple Vitamin (MULTIVITAMIN WITH MINERALS) TABS tablet Take 1 tablet by mouth daily.   Yes [provider]  nitroGLYCERIN (NITROSTAT) 0.4 MG SL tablet Place 1 tablet (0.4 mg total) under the tongue every 5 (five) minutes as needed.  08/31/17  Yes Reino Bellis B, NP  pantoprazole (PROTONIX) 40 MG tablet Take 1 tablet by mouth daily. 08/13/20  Yes [provider]  vitamin C (ASCORBIC ACID) 500 MG tablet Take 500 mg by mouth daily.   Yes [provider]  warfarin (COUMADIN) 5 MG tablet Take 5 mg by mouth daily.   Yes [provider]    Allergies as of 10/05/2020 - Review Complete 10/05/2020  Allergen Reaction Noted  . Codeine Hives, Itching, and Nausea Only 08/01/2010  . Niaspan [niacin er] Other (See Comments) 08/02/2015    Family History  Problem Relation Age of Onset  . Colon cancer Mother 79  . Colon cancer Brother 22       Rectal cancer  . Pulmonary embolism Brother        Cancer  . Colon cancer Paternal Uncle 52  . Colon cancer Paternal Uncle 85  . Throat cancer Brother 35  .  Prostate cancer Brother     Social History   Socioeconomic History  . Marital status: Married    Spouse name: Not on file  . Number of children: 2  . Years of education: Not on file  . Highest education level: Not on file  Occupational History  . Occupation: disablity    Comment: CAD  Tobacco Use  . Smoking status: Former Smoker    Packs/day: 0.10    Years: 13.00    Pack years: 1.30    Types: Cigarettes    Quit date: 05/27/1977    Years since quitting: 43.3  . Smokeless tobacco: Former Systems developer    Quit date: 09/08/1968  Substance and Sexual Activity  . Alcohol use: No  . Drug use: No  . Sexual activity: Yes    Partners: Female    Birth control/protection: None  Other Topics Concern  . Not on file  Social History Narrative  . Not on file   Social Determinants of Health   Financial Resource Strain: Not on file  Food Insecurity: Not on file  Transportation Needs: Not on file  Physical Activity: Not on file  Stress: Not on file  Social Connections: Not on file  Intimate Partner Violence: Not on file    Review of Systems: See HPI, otherwise negative ROS  Physical Exam: BP 126/70    Pulse 77   Temp (!) 97.1 F (36.2 C) (Temporal)   Ht 6' (1.829 m)   Wt 231 lb (104.8 kg)   BMI 31.33 kg/m  General:   Alert,   pleasant and cooperative in NAD Neck:  Supple; no masses or thyromegaly. No significant cervical adenopathy. Lungs:  Clear throughout to auscultation.   No wheezes, crackles, or rhonchi. No acute distress. Heart:  Regular rate and rhythm; no murmurs, clicks, rubs,  or gallops. Abdomen: Non-distended, normal bowel sounds.  Soft and nontender without appreciable mass or hepatosplenomegaly.  Pulses:  Normal pulses noted. Extremities:  Without clubbing or edema.  Impression/Plan: 75 year old gentleman with a personal history of colonic adenomas removed over time in the setting of a marked positive family history of colon cancer.  Although this gentleman has multiple comorbidities and chronically anticoagulated, he continues to have a good quality of life.  I do believe that another colonoscopy is in order.  We discussed the pros and cons of another surveillance colonoscopy at this time.  Patient is definitely interested in proceeding.  GERD fairly well controlled on pantoprazole but not as good as omeprazole.  I see no reason why we can switch back.  No alarm features.  Recommendations:  As discussed, we will set up a surveillance colonoscopy (history of colonic polyps and positive family history colon cancer) ASA 3/propofol  Patient will need to hold Coumadin for 3 days prior to the procedure.  Patient will need bridging with Lovenox.  This needs to be orchestrated through Dr. Kennon Holter office.  Stop pantoprazole/Protonix  Begin omeprazole/Prilosec 40 mg once daily-taken each morning before breakfast.  Dispense 30 with 11 refills.  Further recommendations to follow after colonoscopy has been performed.    Notice: This dictation was prepared with Dragon dictation along with smaller phrase technology. Any transcriptional errors that result from this process are  unintentional and may not be corrected upon review.

## 2020-10-06 ENCOUNTER — Telehealth: Payer: Self-pay

## 2020-10-06 NOTE — Telephone Encounter (Signed)
Dear Dr. Quay Burow, MD   We see this mutual patient [name] (DOB 11-14-1945). We are setting up a surveillance colonoscopy ASA lll/propofol due to history of colonic polyps and a positive family history of colon cancer. The patient will need to hold Coumadin for 3 days prior to the procedure. Patient will need bridging with Lovenox. This needs to be orchestrated by your office. Please advise [place] when this is possible for you since patient last saw you on 10/30/2018.   Thank you, Oleh Genin, CMA

## 2020-10-06 NOTE — Telephone Encounter (Signed)
Noted still waiting to for response

## 2020-10-07 NOTE — Telephone Encounter (Signed)
noted 

## 2020-10-07 NOTE — Telephone Encounter (Signed)
This patient is on warfarin for prior PE/DVT.  He is followed by [facility] and they have bridged him in the past.  [facility] 2020.    Please forward to [facility] in [place] (Dr. Nolon Rod office) and they can arrange the bridging.  Jossie Ng. Bob Daversa NP-C    10/07/2020, 9:03 AM [address] Office [phone] Fax [phone]

## 2020-10-07 NOTE — Telephone Encounter (Signed)
After further research I found that Dr. Gala Romney with [facility] will be performing procedure. I have found a fax and ph# for Dr. Roseanne Kaufman office. I will fax notes with recommendations to Dr. Gala Romney; ph# [phone]; fax # [phone].

## 2020-10-07 NOTE — Telephone Encounter (Signed)
[name], did you contact Dr. Nolon Rod office for clearance? Thanks

## 2020-10-07 NOTE — Telephone Encounter (Signed)
Will send message to CMA to obtain ph and fax#

## 2020-10-08 ENCOUNTER — Telehealth: Payer: Self-pay

## 2020-10-08 NOTE — Telephone Encounter (Signed)
Letter faxed to [facility] / Dr. Gerarda Fraction regarding clearance for colonoscopy.

## 2020-10-08 NOTE — Telephone Encounter (Signed)
error 

## 2020-10-08 NOTE — Telephone Encounter (Signed)
Noted  

## 2020-10-11 DIAGNOSIS — E1165 Type 2 diabetes mellitus with hyperglycemia: Secondary | ICD-10-CM | POA: Diagnosis not present

## 2020-10-11 DIAGNOSIS — Z6833 Body mass index (BMI) 33.0-33.9, adult: Secondary | ICD-10-CM | POA: Diagnosis not present

## 2020-10-11 DIAGNOSIS — M791 Myalgia, unspecified site: Secondary | ICD-10-CM | POA: Diagnosis not present

## 2020-10-11 DIAGNOSIS — E6609 Other obesity due to excess calories: Secondary | ICD-10-CM | POA: Diagnosis not present

## 2020-10-12 ENCOUNTER — Telehealth: Payer: Self-pay

## 2020-10-12 NOTE — Telephone Encounter (Signed)
Will call pt to schedule once we receive Dr. Roseanne Kaufman future procedure schedule

## 2020-10-12 NOTE — Telephone Encounter (Signed)
See prior message thread

## 2020-10-12 NOTE — Telephone Encounter (Signed)
This pt is ok to stop his Coumadin 3 days prior to colonoscopy. ( Seen 10-11-20 at his PCP-[facility]).     Will give to Manuela Schwartz to scan into the pt's chart.

## 2020-10-12 NOTE — Telephone Encounter (Signed)
Floria Raveling I, CMA 13 minutes ago (8:22 AM)     This pt is ok to stop his Coumadin 3 days prior to colonoscopy. ( Seen 10-11-20 at his PCP-[facility]).         Will give to Manuela Schwartz to scan into the pt's chart.

## 2020-10-18 ENCOUNTER — Other Ambulatory Visit: Payer: Self-pay | Admitting: Cardiovascular Disease

## 2020-10-18 DIAGNOSIS — Z9861 Coronary angioplasty status: Secondary | ICD-10-CM

## 2020-10-18 DIAGNOSIS — I2510 Atherosclerotic heart disease of native coronary artery without angina pectoris: Secondary | ICD-10-CM

## 2020-10-19 ENCOUNTER — Telehealth: Payer: Self-pay

## 2020-10-19 ENCOUNTER — Other Ambulatory Visit: Payer: Self-pay

## 2020-10-19 ENCOUNTER — Telehealth: Payer: Self-pay | Admitting: Cardiovascular Disease

## 2020-10-19 MED ORDER — PEG 3350-KCL-NA BICARB-NACL 420 G PO SOLR: 4000.0000 mL | ORAL | 0 refills | Status: DC

## 2020-10-19 MED ORDER — CLOPIDOGREL BISULFATE 75 MG PO TABS: 75.0000 mg | ORAL_TABLET | Freq: Every day | ORAL | 0 refills | Status: DC

## 2020-10-19 NOTE — Telephone Encounter (Signed)
*  STAT* If patient is at the pharmacy, call can be transferred to refill team.   1. Which medications need to be refilled? (please list name of each medication and dose if known)  clopidogrel (PLAVIX) 75 MG tablet   2. Which pharmacy/location (including street and city if local pharmacy) is medication to be sent to?  [id number] - , [place] - [address]  3. Do they need a 30 day or 90 day supply? 90 with refills   Patient is scheduled for a f/u visit 12/28/20

## 2020-10-19 NOTE — Telephone Encounter (Signed)
Called pt, TCS w/Propofol ASA 3 w/Dr. Gala Romney scheduled for 12/02/20 at 1:00pm. Advised pt to hold Coumadin for 3 days prior to procedure. He received Lovenox from PCP but no instructions. Faxed letter to PCP to inform him of TCS date and pt needs Lovenx bridge instructions. Orders entered. Rx for prep sent to pharmacy.

## 2020-10-19 NOTE — Telephone Encounter (Signed)
Let patient know that I sent in refills for his Plavix over to the pharmacy.

## 2020-10-20 NOTE — Telephone Encounter (Signed)
Pre-op appt 11/29/20. Appt letter mailed with procedure instructions.

## 2020-11-05 DIAGNOSIS — Z7901 Long term (current) use of anticoagulants: Secondary | ICD-10-CM | POA: Diagnosis not present

## 2020-11-25 NOTE — Patient Instructions (Signed)
Steven Oneal  11/25/2020     '@PREFPERIOPPHARMACY'$ @   Your procedure is scheduled on 12/02/2020.   Report to [facility] at 1130  A.M.   Call this number if you have problems the morning of surgery:  [phone]   Remember:  Follow the diet and prep instructions given to you by the office.    Take these medicines the morning of surgery with A SIP OF WATER                 amlodipine, valium, levothyroxine, gabapentin, metoprolol, omeprazole.     Do not wear jewelry, make-up or nail polish.  Do not wear lotions, powders, or perfumes, or deodorant.  Do not shave 48 hours prior to surgery.  Men may shave face and neck.  Do not bring valuables to the hospital.  [facility] is not responsible for any belongings or valuables.  Contacts, dentures or bridgework may not be worn into surgery.  Leave your suitcase in the car.  After surgery it may be brought to your room.  For patients admitted to the hospital, discharge time will be determined by your treatment team.  Patients discharged the day of surgery will not be allowed to drive home and must have someone with them for 24 hours.     Special instructions:   DO NOT smoke tobacco or vape for 24 hours before your procedure.  Please read over the following fact sheets that you were given. Anesthesia Post-op Instructions and Care and Recovery After Surgery      Colonoscopy, Adult, Care After This sheet gives you information about how to care for yourself after your procedure. Your health care provider may also give you more specific instructions. If you have problems or questions, contact your health careprovider. What can I expect after the procedure? After the procedure, it is common to have: A small amount of blood in your stool for 24 hours after the procedure. Some gas. Mild cramping or bloating of your abdomen. Follow these instructions at home: Eating and drinking  Drink enough fluid to keep your urine  pale yellow. Follow instructions from your health care provider about eating or drinking restrictions. Resume your normal diet as instructed by your health care provider. Avoid heavy or fried foods that are hard to digest.  Activity Rest as told by your health care provider. Avoid sitting for a long time without moving. Get up to take short walks every 1-2 hours. This is important to improve blood flow and breathing. Ask for help if you feel weak or unsteady. Return to your normal activities as told by your health care provider. Ask your health care provider what activities are safe for you. Managing cramping and bloating  Try walking around when you have cramps or feel bloated. Apply heat to your abdomen as told by your health care provider. Use the heat source that your health care provider recommends, such as a moist heat pack or a heating pad. Place a towel between your skin and the heat source. Leave the heat on for 20-30 minutes. Remove the heat if your skin turns bright red. This is especially important if you are unable to feel pain, heat, or cold. You may have a greater risk of getting burned.  General instructions If you were given a sedative during the procedure, it can affect you for several hours. Do not drive or operate machinery until your health care provider says that it is safe.  For the first 24 hours after the procedure: Do not sign important documents. Do not drink alcohol. Do your regular daily activities at a slower pace than normal. Eat soft foods that are easy to digest. Take over-the-counter and prescription medicines only as told by your health care provider. Keep all follow-up visits as told by your health care provider. This is important. Contact a health care provider if: You have blood in your stool 2-3 days after the procedure. Get help right away if you have: More than a small spotting of blood in your stool. Large blood clots in your stool. Swelling of  your abdomen. Nausea or vomiting. A fever. Increasing pain in your abdomen that is not relieved with medicine. Summary After the procedure, it is common to have a small amount of blood in your stool. You may also have mild cramping and bloating of your abdomen. If you were given a sedative during the procedure, it can affect you for several hours. Do not drive or operate machinery until your health care provider says that it is safe. Get help right away if you have a lot of blood in your stool, nausea or vomiting, a fever, or increased pain in your abdomen. This information is not intended to replace advice given to you by your health care provider. Make sure you discuss any questions you have with your healthcare provider. Document Revised: 04/11/2019 Document Reviewed: 11/11/2018 Elsevier Patient Education  [place] After This sheet gives you information about how to care for yourself after your procedure. Your health care provider may also give you more specific instructions. If you have problems or questions, contact your health careprovider. What can I expect after the procedure? After the procedure, it is common to have: Tiredness. Forgetfulness about what happened after the procedure. Impaired judgment for important decisions. Nausea or vomiting. Some difficulty with balance. Follow these instructions at home: For the time period you were told by your health care provider:     Rest as needed. Do not participate in activities where you could fall or become injured. Do not drive or use machinery. Do not drink alcohol. Do not take sleeping pills or medicines that cause drowsiness. Do not make important decisions or sign legal documents. Do not take care of children on your own. Eating and drinking Follow the diet that is recommended by your health care provider. Drink enough fluid to keep your urine pale yellow. If you vomit: Drink  water, juice, or soup when you can drink without vomiting. Make sure you have little or no nausea before eating solid foods. General instructions Have a responsible adult stay with you for the time you are told. It is important to have someone help care for you until you are awake and alert. Take over-the-counter and prescription medicines only as told by your health care provider. If you have sleep apnea, surgery and certain medicines can increase your risk for breathing problems. Follow instructions from your health care provider about wearing your sleep device: Anytime you are sleeping, including during daytime naps. While taking prescription pain medicines, sleeping medicines, or medicines that make you drowsy. Avoid smoking. Keep all follow-up visits as told by your health care provider. This is important. Contact a health care provider if: You keep feeling nauseous or you keep vomiting. You feel light-headed. You are still sleepy or having trouble with balance after 24 hours. You develop a rash. You have a fever. You have redness or swelling  around the IV site. Get help right away if: You have trouble breathing. You have new-onset confusion at home. Summary For several hours after your procedure, you may feel tired. You may also be forgetful and have poor judgment. Have a responsible adult stay with you for the time you are told. It is important to have someone help care for you until you are awake and alert. Rest as told. Do not drive or operate machinery. Do not drink alcohol or take sleeping pills. Get help right away if you have trouble breathing, or if you suddenly become confused. This information is not intended to replace advice given to you by your health care provider. Make sure you discuss any questions you have with your healthcare provider. Document Revised: 01/01/2020 Document Reviewed: 03/20/2019 Elsevier Patient Education  2022 [employer].

## 2020-11-29 ENCOUNTER — Encounter (HOSPITAL_COMMUNITY)
Admission: RE | Admit: 2020-11-29 | Discharge: 2020-11-29 | Disposition: A | Discharge status: Home / Self Care | Payer: Medicare HMO | Source: Ambulatory Visit | Attending: Internal Medicine | Admitting: Internal Medicine

## 2020-11-29 ENCOUNTER — Other Ambulatory Visit: Payer: Self-pay

## 2020-11-29 DIAGNOSIS — I252 Old myocardial infarction: Secondary | ICD-10-CM | POA: Diagnosis not present

## 2020-11-29 DIAGNOSIS — Z01810 Encounter for preprocedural cardiovascular examination: Secondary | ICD-10-CM | POA: Diagnosis not present

## 2020-11-29 DIAGNOSIS — I10 Essential (primary) hypertension: Secondary | ICD-10-CM | POA: Insufficient documentation

## 2020-12-02 ENCOUNTER — Ambulatory Visit (HOSPITAL_COMMUNITY)
Admission: RE | Admit: 2020-12-02 | Discharge: 2020-12-02 | Disposition: A | Discharge status: Home / Self Care | Payer: Medicare HMO | Attending: Internal Medicine | Admitting: Internal Medicine

## 2020-12-02 ENCOUNTER — Ambulatory Visit (HOSPITAL_COMMUNITY): Payer: Medicare HMO | Admitting: Anesthesiology

## 2020-12-02 ENCOUNTER — Other Ambulatory Visit: Payer: Self-pay

## 2020-12-02 ENCOUNTER — Encounter (HOSPITAL_COMMUNITY)
Admission: RE | Disposition: A | Discharge status: Home / Self Care | Payer: Self-pay | Source: Home / Self Care | Attending: Internal Medicine

## 2020-12-02 ENCOUNTER — Encounter (HOSPITAL_COMMUNITY): Payer: Self-pay | Admitting: Internal Medicine

## 2020-12-02 ENCOUNTER — Telehealth: Payer: Self-pay | Admitting: Internal Medicine

## 2020-12-02 DIAGNOSIS — Z7901 Long term (current) use of anticoagulants: Secondary | ICD-10-CM | POA: Diagnosis not present

## 2020-12-02 DIAGNOSIS — Z79890 Hormone replacement therapy (postmenopausal): Secondary | ICD-10-CM | POA: Diagnosis not present

## 2020-12-02 DIAGNOSIS — Z79899 Other long term (current) drug therapy: Secondary | ICD-10-CM | POA: Insufficient documentation

## 2020-12-02 DIAGNOSIS — Z888 Allergy status to other drugs, medicaments and biological substances status: Secondary | ICD-10-CM | POA: Insufficient documentation

## 2020-12-02 DIAGNOSIS — K5730 Diverticulosis of large intestine without perforation or abscess without bleeding: Secondary | ICD-10-CM | POA: Insufficient documentation

## 2020-12-02 DIAGNOSIS — K635 Polyp of colon: Secondary | ICD-10-CM | POA: Insufficient documentation

## 2020-12-02 DIAGNOSIS — Z800 Family history of malignant neoplasm of digestive organs: Secondary | ICD-10-CM | POA: Insufficient documentation

## 2020-12-02 DIAGNOSIS — I25119 Atherosclerotic heart disease of native coronary artery with unspecified angina pectoris: Secondary | ICD-10-CM | POA: Diagnosis not present

## 2020-12-02 DIAGNOSIS — Z885 Allergy status to narcotic agent status: Secondary | ICD-10-CM | POA: Diagnosis not present

## 2020-12-02 DIAGNOSIS — Z1211 Encounter for screening for malignant neoplasm of colon: Secondary | ICD-10-CM | POA: Diagnosis not present

## 2020-12-02 DIAGNOSIS — Z86010 Personal history of colonic polyps: Secondary | ICD-10-CM | POA: Insufficient documentation

## 2020-12-02 DIAGNOSIS — Z87891 Personal history of nicotine dependence: Secondary | ICD-10-CM | POA: Insufficient documentation

## 2020-12-02 HISTORY — PX: COLONOSCOPY WITH PROPOFOL: SHX5780

## 2020-12-02 HISTORY — PX: POLYPECTOMY: SHX5525

## 2020-12-02 SURGERY — COLONOSCOPY WITH PROPOFOL
Anesthesia: General

## 2020-12-02 MED ORDER — PHENYLEPHRINE HCL (PRESSORS) 10 MG/ML IV SOLN
INTRAVENOUS | Status: DC | PRN
  Administered 2020-12-02: 100 ug via INTRAVENOUS

## 2020-12-02 MED ORDER — PROPOFOL 10 MG/ML IV BOLUS
INTRAVENOUS | Status: DC | PRN
  Administered 2020-12-02: 100 ug/kg/min via INTRAVENOUS
  Administered 2020-12-02: 80 mg via INTRAVENOUS

## 2020-12-02 MED ORDER — LIDOCAINE HCL (CARDIAC) PF 100 MG/5ML IV SOSY
PREFILLED_SYRINGE | INTRAVENOUS | Status: DC | PRN
  Administered 2020-12-02: 50 mg via INTRAVENOUS

## 2020-12-02 MED ORDER — STERILE WATER FOR IRRIGATION IR SOLN
Status: DC | PRN
  Administered 2020-12-02: 200 mL

## 2020-12-02 MED ORDER — LACTATED RINGERS IV SOLN: INTRAVENOUS | Status: DC

## 2020-12-02 NOTE — Discharge Instructions (Signed)
.   Colonoscopy Discharge Instructions  Read the instructions outlined below and refer to this sheet in the next few weeks. These discharge instructions provide you with general information on caring for yourself after you leave the hospital. Your doctor may also give you specific instructions. While your treatment has been planned according to the most current medical practices available, unavoidable complications occasionally occur. If you have any problems or questions after discharge, call Dr. Gala Romney at [phone]. ACTIVITY You may resume your regular activity, but move at a slower pace for the next 24 hours.  Take frequent rest periods for the next 24 hours.  Walking will help get rid of the air and reduce the bloated feeling in your belly (abdomen).  No driving for 24 hours (because of the medicine (anesthesia) used during the test).   Do not sign any important legal documents or operate any machinery for 24 hours (because of the anesthesia used during the test).  NUTRITION Drink plenty of fluids.  You may resume your normal diet as instructed by your doctor.  Begin with a light meal and progress to your normal diet. Heavy or fried foods are harder to digest and may make you feel sick to your stomach (nauseated).  Avoid alcoholic beverages for 24 hours or as instructed.  MEDICATIONS You may resume your normal medications unless your doctor tells you otherwise.  WHAT YOU CAN EXPECT TODAY Some feelings of bloating in the abdomen.  Passage of more gas than usual.  Spotting of blood in your stool or on the toilet paper.  IF YOU HAD POLYPS REMOVED DURING THE COLONOSCOPY: No aspirin products for 7 days or as instructed.  No alcohol for 7 days or as instructed.  Eat a soft diet for the next 24 hours.  FINDING OUT THE RESULTS OF YOUR TEST Not all test results are available during your visit. If your test results are not back during the visit, make an appointment with your caregiver to find out  the results. Do not assume everything is normal if you have not heard from your caregiver or the medical facility. It is important for you to follow up on all of your test results.  SEEK IMMEDIATE MEDICAL ATTENTION IF: You have more than a spotting of blood in your stool.  Your belly is swollen (abdominal distention).  You are nauseated or vomiting.  You have a temperature over 101.  You have abdominal pain or discomfort that is severe or gets worse throughout the day.    1 polyp removed from your colon  Diverticulosis and polyp information provided  Resume warfarin today  Further recommendations to follow pending review of pathology report  At patient request, called Shanon Brow at [phone] -discussed findings and recommendations

## 2020-12-02 NOTE — H&P (Signed)
$'@LOGO'N$ @   Primary Care Physician:  Redmond School, MD Primary Gastroenterologist:  Dr. Gala Romney  Pre-Procedure History & Physical: HPI:  Steven Oneal is a 75 y.o. male here for surveillance colonoscopy.  History of colonic adenomas removed previously.  And positive family history colon cancer.  Past Medical History:  Diagnosis Date   Allergic rhinitis    Anticoagulated on Coumadin    CAD (coronary artery disease) cardiologist-  dr berry   DES Cypher x 2 mid & ostial RCA 2007; Repeat LHC 06/2014 angiographically minimal CAD with widely patent RCA stents EF 60-65% 08/29/17 STEMI with PCI/DES x1 to mRCA   Dyslipidemia    ED (erectile dysfunction)    vascular   Essential hypertension    First degree heart block    GERD (gastroesophageal reflux disease)    Hiatal hernia    History of adenomatous polyp of colon    tubular adenoma 08-10-2010   History of DVT (deep vein thrombosis) 11/08/2012   LLE   History of pulmonary embolus (PE) 11/08/2012   LLL   History of ST elevation myocardial infarction (STEMI) 06/15/2005   inferior wall stemi-- s/p  pci and des x2 to rca   Nocturia    PAC (premature atrial contraction)    S/P drug eluting coronary stent placement 06/15/2005   to mid and ostial RCA   Vertigo    Wears dentures    upper denture and lower partial    Past Surgical History:  Procedure Laterality Date   CARDIAC CATHETERIZATION  10/09/2005   Patent stents, medical therapy   CARDIAC CATHETERIZATION  10/22/2006   Nonobstructive disease, 60% LAD, patent RCA stents   CARDIOVASCULAR STRESS TEST  05-08-2013  dr berry   Low risk nuclear perfusion study w/ basal inferior wal nontransmural infarction and no evidence ischemia/   normal LV function and mild basal inferior wall hypokinesis,  ef 65%   CHOLECYSTECTOMY N/A 07/14/2013   Procedure: LAPAROSCOPIC CHOLECYSTECTOMY;  Surgeon: Jamesetta So, MD;  Location: [facility];  Service: General;  Laterality: N/A;   COLONOSCOPY N/A 08/03/2015    Dr. Gala Romney: diverticulosis in entire colon, one 4 mm tubular adenoma, 5 year surveillance   CORONARY ANGIOPLASTY WITH STENT PLACEMENT  06/15/2005  dr Tami Ribas   Cypher stent to mid RCA; cypher stent -ostial RCA, RESIDUAL disease LAD   CORONARY/GRAFT ACUTE MI REVASCULARIZATION N/A 08/29/2017   Procedure: Coronary/Graft Acute MI Revascularization;  Surgeon: Burnell Blanks, MD;  Location: [place];  Service: Cardiovascular;  Laterality: N/A;   ESOPHAGOGASTRODUODENOSCOPY N/A 08/03/2015   Dr. Gala Romney: small hiatal hernia, otherwise normal   LAPAROSCOPIC APPENDECTOMY  03/10/2008   LEFT HEART CATH AND CORONARY ANGIOGRAPHY N/A 08/29/2017   Procedure: LEFT HEART CATH AND CORONARY ANGIOGRAPHY;  Surgeon: Burnell Blanks, MD;  Location: [facility];  Service: Cardiovascular;  Laterality: N/A;   LEFT HEART CATHETERIZATION WITH CORONARY ANGIOGRAM N/A 06/12/2014   Procedure: LEFT HEART CATHETERIZATION WITH CORONARY ANGIOGRAM;  Surgeon: Leonie Man, MD;  Location: [facility];  Service: Cardiovascular;  Laterality: N/A;  Minimal CAD w/ widely patent RCA stents;  well-preserved LVEF w/ normal EDP    PENILE PROSTHESIS IMPLANT N/A 09/19/2016   Procedure: INSERTION SEMI-RIGID PENILE PROSTHESIS COLOPLAST;  Surgeon: Irine Seal, MD;  Location: [facility];  Service: Urology;  Laterality: N/A;    Prior to Admission medications   Medication Sig Start Date End Date Taking? Authorizing Provider  amLODipine (NORVASC) 5 MG tablet TAKE ONE TABLET BY  MOUTH ONCE DAILY Patient taking differently: Take 5 mg by mouth daily. 01/03/17  Yes Lorretta Harp, MD  Ascorbic Acid (VITAMIN C) 1000 MG tablet Take 1,000 mg by mouth daily.   Yes [provider]  atorvastatin (LIPITOR) 80 MG tablet Take 1 tablet (80 mg total) by mouth daily at 6 PM. Patient taking differently: Take 80 mg by mouth at bedtime. 08/31/17  Yes Reino Bellis B, NP  cholecalciferol (VITAMIN D3) 25 MCG (1000  UNIT) tablet Take 1,000 Units by mouth daily.   Yes [provider]  clopidogrel (PLAVIX) 75 MG tablet Take 1 tablet (75 mg total) by mouth daily. KEEP OV. 10/19/20  Yes Lorretta Harp, MD  diazepam (VALIUM) 10 MG tablet Take 10 mg by mouth in the morning, at noon, and at bedtime.   Yes [provider]  dicyclomine (BENTYL) 10 MG capsule Take 1 capsule (10 mg total) by mouth 3 (three) times daily before meals. 11/25/19  Yes Annitta Needs, NP  gabapentin (NEURONTIN) 300 MG capsule Take 300 mg by mouth 3 (three) times daily. 08/13/20  Yes [provider]  levothyroxine (SYNTHROID) 50 MCG tablet Take 50 mcg by mouth daily before breakfast. 08/18/20  Yes [provider]  losartan (COZAAR) 50 MG tablet TAKE 1 TABLET(50 MG) BY MOUTH DAILY Patient taking differently: Take 50 mg by mouth daily. 10/02/17  Yes Lorretta Harp, MD  meclizine (ANTIVERT) 25 MG tablet Take 25 mg by mouth as needed for dizziness (FOR INNER EAR).  05/13/13  Yes [provider]  metoprolol succinate (TOPROL-XL) 25 MG 24 hr tablet Take 25 mg by mouth daily. 11/08/20  Yes [provider]  Multiple Vitamin (MULTIVITAMIN WITH MINERALS) TABS tablet Take 1 tablet by mouth daily.   Yes [provider]  Omega-3 Fatty Acids (FISH OIL) 1200 MG CAPS Take 1,200 mg by mouth daily.   Yes [provider]  omeprazole (PRILOSEC) 40 MG capsule Take 1 capsule (40 mg total) by mouth daily. Patient taking differently: Take 40 mg by mouth in the morning. 10/05/20 11/23/20 Yes Carlin Mamone, [name], MD  polyethylene glycol-electrolytes (TRILYTE) 420 g solution Take 4,000 mLs by mouth as directed. 10/19/20  Yes Jearline Hirschhorn, [name], MD  warfarin (COUMADIN) 5 MG tablet Take 5 mg by mouth at bedtime.   Yes [provider]  metoprolol tartrate (LOPRESSOR) 25 MG tablet TAKE 1 TABLET BY MOUTH TWICE DAILY Patient not taking: No sig reported 10/02/17   Lorretta Harp, MD  nitroGLYCERIN (NITROSTAT)  0.4 MG SL tablet Place 1 tablet (0.4 mg total) under the tongue every 5 (five) minutes as needed. 08/31/17   Cheryln Manly, NP    Allergies as of 10/19/2020 - Review Complete 10/05/2020  Allergen Reaction Noted   Codeine Hives, Itching, and Nausea Only 08/01/2010   Niaspan [niacin er] Other (See Comments) 08/02/2015    Family History  Problem Relation Age of Onset   Colon cancer Mother 29   Colon cancer Brother 39       Rectal cancer   Pulmonary embolism Brother        Cancer   Colon cancer Paternal Uncle 66   Colon cancer Paternal Uncle 4   Throat cancer Brother 74   Prostate cancer Brother     Social History   Socioeconomic History   Marital status: Married    Spouse name: Not on file   Number of children: 2   Years of education: Not on file  Highest education level: Not on file  Occupational History   Occupation: disablity    Comment: CAD  Tobacco Use   Smoking status: Former    Packs/day: 0.10    Years: 13.00    Pack years: 1.30    Types: Cigarettes    Quit date: 05/27/1977    Years since quitting: 43.5   Smokeless tobacco: Former    Quit date: 09/08/1968  Substance and Sexual Activity   Alcohol use: No   Drug use: No   Sexual activity: Yes    Partners: Female    Birth control/protection: None  Other Topics Concern   Not on file  Social History Narrative   Not on file   Social Determinants of Health   Financial Resource Strain: Not on file  Food Insecurity: Not on file  Transportation Needs: Not on file  Physical Activity: Not on file  Stress: Not on file  Social Connections: Not on file  Intimate Partner Violence: Not on file    Review of Systems: See HPI, otherwise negative ROS  Physical Exam: BP 139/72   Resp 14   Ht 6' (1.829 m)   Wt 104.8 kg   SpO2 96%   BMI 31.33 kg/m  General:   Alert,  Well-developed, well-nourished, pleasant and cooperative in NAD S cervical adenopathy. Lungs:  Clear throughout to auscultation.   No  wheezes, crackles, or rhonchi. No acute distress. Heart:  Regular rate and rhythm; no murmurs, clicks, rubs,  or gallops. Abdomen: Non-distended, normal bowel sounds.  Soft and nontender without appreciable mass or hepatosplenomegaly.  Pulses:  Normal pulses noted. Extremities:  Without clubbing or edema.  Impression/Plan: 75 year old gentleman with history of colonic adenomas and a positive history of colon cancer in first-degree relative. Here for surveillance colonoscopy. The risks, benefits, limitations, alternatives and imponderables have been reviewed with the patient. Questions have been answered. All parties are agreeable.       Notice: This dictation was prepared with Dragon dictation along with smaller phrase technology. Any transcriptional errors that result from this process are unintentional and may not be corrected upon review.

## 2020-12-02 NOTE — Anesthesia Postprocedure Evaluation (Signed)
Anesthesia Post Note  Patient: Steven Oneal  Procedure(s) Performed: COLONOSCOPY WITH PROPOFOL POLYPECTOMY  Patient location during evaluation: Phase II Anesthesia Type: General Level of consciousness: awake and alert and oriented Pain management: pain level controlled Vital Signs Assessment: post-procedure vital signs reviewed and stable Respiratory status: spontaneous breathing and respiratory function stable Cardiovascular status: blood pressure returned to baseline and stable Postop Assessment: no apparent nausea or vomiting Anesthetic complications: no   No notable events documented.   Last Vitals:  Vitals:   12/02/20 1126 12/02/20 1332  BP: 139/72 99/87  Pulse:  77  Resp: 14 16  Temp:  36.5 C  SpO2: 96% 96%    Last Pain:  Vitals:   12/02/20 1332  TempSrc: Oral  PainSc: 0-No pain                 Sherita Decoste C Cheng Dec

## 2020-12-02 NOTE — Interval H&P Note (Signed)
History and Physical Interval Note:  12/02/2020 1:11 PM  Steven Oneal  has presented today for surgery, with the diagnosis of history of colonic polyps and positive family history colon cancer.  The various methods of treatment have been discussed with the patient and family. After consideration of risks, benefits and other options for treatment, the patient has consented to  Procedure(s) with comments: COLONOSCOPY WITH PROPOFOL (N/A) - 1:00PM as a surgical intervention.  The patient's history has been reviewed, patient examined, no change in status, stable for surgery.  I have reviewed the patient's chart and labs.  Questions were answered to the patient's satisfaction.     Manus Rudd  Last Coumadin 4 days ago per plan.  Remains on Plavix.

## 2020-12-02 NOTE — Telephone Encounter (Signed)
Pt had procedure today by Dr Gala Romney and called this afternoon saying Dr Gala Romney told him to call the office so he could speak with him. I told him Dr Gala Romney wasn't in the office today and he said he would call back Monday.

## 2020-12-02 NOTE — Anesthesia Preprocedure Evaluation (Signed)
Anesthesia Evaluation  Patient identified by MRN, date of birth, ID band Patient awake    Reviewed: Allergy & Precautions, NPO status , Patient's Chart, lab work & pertinent test results, reviewed documented beta blocker date and time   History of Anesthesia Complications Negative for: history of anesthetic complications  Airway Mallampati: II  TM Distance: >3 FB Neck ROM: Full    Dental  (+) Edentulous Upper, Edentulous Lower   Pulmonary former smoker, PE   Pulmonary exam normal breath sounds clear to auscultation       Cardiovascular Exercise Tolerance: Good hypertension, Pt. on medications and Pt. on home beta blockers + angina + CAD, + Past MI, + Cardiac Stents and + DVT  Normal cardiovascular exam+ dysrhythmias  Rhythm:Regular Rate:Normal  PACs   Neuro/Psych negative neurological ROS  negative psych ROS   GI/Hepatic Neg liver ROS, hiatal hernia, GERD  Medicated and Controlled,  Endo/Other  negative endocrine ROS  Renal/GU negative Renal ROS     Musculoskeletal  (+) Arthritis ,   Abdominal   Peds  Hematology negative hematology ROS (+)   Anesthesia Other Findings   Reproductive/Obstetrics negative OB ROS                            Anesthesia Physical Anesthesia Plan  ASA: 3  Anesthesia Plan: General   Post-op Pain Management:    Induction: Intravenous  PONV Risk Score and Plan:   Airway Management Planned: Nasal Cannula and Natural Airway  Additional Equipment:   Intra-op Plan:   Post-operative Plan:   Informed Consent: I have reviewed the patients History and Physical, chart, labs and discussed the procedure including the risks, benefits and alternatives for the proposed anesthesia with the patient or authorized representative who has indicated his/her understanding and acceptance.     Dental advisory given  Plan Discussed with: CRNA and Surgeon  Anesthesia  Plan Comments:         Anesthesia Quick Evaluation

## 2020-12-02 NOTE — Op Note (Signed)
[facility] Patient Name: Steven Oneal Procedure Date: 12/02/2020 12:50 PM MRN: [id number] Date of Birth: Apr 05, 1946 Attending MD: Norvel Richards , MD CSN: [id number] Age: 75 Admit Type: Outpatient Procedure:                Colonoscopy Indications:              High risk colon cancer surveillance: Personal                            history of colonic polyps Providers:                Norvel Richards, MD, Lurline Del, RN, Aram Candela Referring MD:              Medicines:                Propofol per Anesthesia Complications:            No immediate complications. Estimated Blood Loss:     Estimated blood loss was minimal. Procedure:                Pre-Anesthesia Assessment:                           - Prior to the procedure, a History and Physical                            was performed, and patient medications and                            allergies were reviewed. The patient's tolerance of                            previous anesthesia was also reviewed. The risks                            and benefits of the procedure and the sedation                            options and risks were discussed with the patient.                            All questions were answered, and informed consent                            was obtained. Prior Anticoagulants: The patient                            last took Coumadin (warfarin) 3 days prior to the                            procedure. ASA Grade Assessment: III - A patient  with severe systemic disease. After reviewing the                            risks and benefits, the patient was deemed in                            satisfactory condition to undergo the procedure.                           After obtaining informed consent, the colonoscope                            was passed under direct vision. Throughout the                            procedure, the patient's blood  pressure, pulse, and                            oxygen saturations were monitored continuously. The                            [id number]) scope was introduced through the                            anus and advanced to the the cecum, identified by                            appendiceal orifice and ileocecal valve. The                            colonoscopy was performed without difficulty. The                            patient tolerated the procedure well. The quality                            of the bowel preparation was adequate. Scope In: 1:15:15 PM Scope Out: 1:29:24 PM Scope Withdrawal Time: 0 hours 6 minutes 19 seconds  Total Procedure Duration: 0 hours 14 minutes 9 seconds  Findings:      The perianal and digital rectal examinations were normal.      Scattered small and large-mouthed diverticula were found in the entire       colon.      A 6 mm polyp was found in the sigmoid colon. The polyp was       semi-pedunculated. The polyp was removed with a cold snare. Resection       and retrieval were complete. Estimated blood loss was minimal.      The exam was otherwise without abnormality on direct and retroflexion       views. Impression:               - Diverticulosis in the entire examined colon.                           - One 6 mm polyp in  the sigmoid colon, removed with                            a cold snare. Resected and retrieved.                           - The examination was otherwise normal on direct                            and retroflexion views. Moderate Sedation:      Moderate (conscious) sedation was personally administered by an       anesthesia professional. The following parameters were monitored: oxygen       saturation, heart rate, blood pressure, respiratory rate, EKG, adequacy       of pulmonary ventilation, and response to care. Recommendation:           - Patient has a contact number available for                            emergencies. The  signs and symptoms of potential                            delayed complications were discussed with the                            patient. Return to normal activities tomorrow.                            Written discharge instructions were provided to the                            patient.                           - Advance diet as tolerated.                           - Repeat colonoscopy after studies are complete for                            surveillance.                           - Return to GI office (date not yet determined).                            Follow-up on pathology.                           Resume Coumadin today. Procedure Code(s):        --- Professional ---                           [phone], Colonoscopy, flexible; with removal of                            tumor(s),  polyp(s), or other lesion(s) by snare                            technique Diagnosis Code(s):        --- Professional ---                           Z86.010, Personal history of colonic polyps                           K63.5, Polyp of colon                           K57.30, Diverticulosis of large intestine without                            perforation or abscess without bleeding CPT copyright 2019 [employer]. All rights reserved. The codes documented in this report are preliminary and upon coder review may  be revised to meet current compliance requirements. [name]. Mercer Peifer, MD Norvel Richards, MD 12/02/2020 1:38:53 PM This report has been signed electronically. Number of Addenda: 0

## 2020-12-02 NOTE — Transfer of Care (Signed)
Immediate Anesthesia Transfer of Care Note  Patient: Steven Oneal  Procedure(s) Performed: COLONOSCOPY WITH PROPOFOL POLYPECTOMY  Patient Location: Short Stay  Anesthesia Type:General  Level of Consciousness: awake, alert , oriented and patient cooperative  Airway & Oxygen Therapy: Patient Spontanous Breathing  Post-op Assessment: Report given to RN, Post -op Vital signs reviewed and stable and Patient moving all extremities X 4  Post vital signs: Reviewed and stable  Last Vitals:  Vitals Value Taken Time  BP    Temp    Pulse    Resp    SpO2      Last Pain:  Vitals:   12/02/20 1126  TempSrc: Oral  PainSc: 0-No pain      Patients Stated Pain Goal: 3 ([id number])  Complications: No notable events documented.

## 2020-12-03 NOTE — Telephone Encounter (Signed)
Pt said that he read his discharge papers wrong.  He read over them again and seen that he was to call if any problems.  He denies any problems at this time.

## 2020-12-06 DIAGNOSIS — Z7901 Long term (current) use of anticoagulants: Secondary | ICD-10-CM | POA: Diagnosis not present

## 2020-12-06 LAB — SURGICAL PATHOLOGY

## 2020-12-08 ENCOUNTER — Encounter: Payer: Self-pay | Admitting: Internal Medicine

## 2020-12-10 ENCOUNTER — Encounter (HOSPITAL_COMMUNITY): Payer: Self-pay | Admitting: Internal Medicine

## 2020-12-14 ENCOUNTER — Other Ambulatory Visit: Payer: Self-pay | Admitting: Gastroenterology

## 2020-12-15 ENCOUNTER — Telehealth: Payer: Self-pay | Admitting: Internal Medicine

## 2020-12-15 NOTE — Telephone Encounter (Signed)
Spoke with pt and informed him that Vicente Males refilled his dicyclomine yesterday.

## 2020-12-15 NOTE — Telephone Encounter (Signed)
It was not me. I have not seen the patient.

## 2020-12-15 NOTE — Telephone Encounter (Signed)
Pt called asking for LSL. I told him she wasn't in the office today. He said he was returning a call from yesterday, He thinks its about his medication. Please call [phone]

## 2020-12-28 ENCOUNTER — Ambulatory Visit (INDEPENDENT_AMBULATORY_CARE_PROVIDER_SITE_OTHER): Payer: Medicare HMO | Admitting: Medical

## 2020-12-28 ENCOUNTER — Encounter: Payer: Self-pay | Admitting: Medical

## 2020-12-28 ENCOUNTER — Other Ambulatory Visit: Payer: Self-pay

## 2020-12-28 DIAGNOSIS — Z86718 Personal history of other venous thrombosis and embolism: Secondary | ICD-10-CM | POA: Diagnosis not present

## 2020-12-28 DIAGNOSIS — Z86711 Personal history of pulmonary embolism: Secondary | ICD-10-CM | POA: Diagnosis not present

## 2020-12-28 DIAGNOSIS — I10 Essential (primary) hypertension: Secondary | ICD-10-CM

## 2020-12-28 DIAGNOSIS — K219 Gastro-esophageal reflux disease without esophagitis: Secondary | ICD-10-CM | POA: Diagnosis not present

## 2020-12-28 DIAGNOSIS — E785 Hyperlipidemia, unspecified: Secondary | ICD-10-CM

## 2020-12-28 DIAGNOSIS — I2510 Atherosclerotic heart disease of native coronary artery without angina pectoris: Secondary | ICD-10-CM

## 2020-12-28 NOTE — Progress Notes (Signed)
Cardiology Office Note   Date:  12/28/2020   ID:  Auther, Chapla 12-02-1945, MRN [id number]  PCP:  Redmond School, MD  Cardiologist:  Quay Burow, MD EP: None  Chief Complaint  Patient presents with   Follow-up    CAD      History of Present Illness: Steven Oneal is a 75 y.o. male with a PMH of CAD s/p PCI/DES to RCA in 2007 and 2019, HTN, HLD, PE/DVT on coumadin, who presents for routine follow-up.  He was last evaluated by cardiology at an outpatient visit with Dr. [name] 10/2018, at which time he was doing well from a cardiac standpoint and without anginal complaints. No medication changes occurred at this visit and he was recommended to follow-up in 1 year. His cardiac history dates back to 2007 when he presented with an MI and underwent PCI/DES to RCA. He has had several repeat caths since that time., most recently in 2019 when he presented with a STEMI and was found to have 99% mRCA stenosis managed with PTCA/DES, as well as 30% ost-proxRCA stenosis, 30% ost-prox LAD stenosis, 20% m-dLAD stenosis, and 20% OM1 stenosis which was medically managed. His last echocardiogram in 2019 showed EF 55-60%, basal-mid inferior wall hypokinesis, G1DD, and mild MR/TR.   He presents today for an overdue follow-up. He has been doing fairly well from a cardiac standpoint since his last visit. He has been providing care for his wife who has dementia. She recently required surgery on her leg for a large hematoma and has been essentially bed bound since then. He is lifting her to help her to the bathroom which is fairly strenuous. He continues to do all his ADLs, household chores, and push mowing his lawn. He has occasional chronic DOE and LE edema which are unchanged. No complaints of palpitations, dizziness, lightheadedness, syncope, orthopnea, or PND. He will be celebrating his 27th anniversary with his wife 12/30/20.     Past Medical History:  Diagnosis Date   Allergic rhinitis     Anticoagulated on Coumadin    CAD (coronary artery disease) cardiologist-  dr berry   DES Cypher x 2 mid & ostial RCA 2007; Repeat LHC 06/2014 angiographically minimal CAD with widely patent RCA stents EF 60-65% 08/29/17 STEMI with PCI/DES x1 to mRCA   Dyslipidemia    ED (erectile dysfunction)    vascular   Essential hypertension    First degree heart block    GERD (gastroesophageal reflux disease)    Hiatal hernia    History of adenomatous polyp of colon    tubular adenoma 08-10-2010   History of DVT (deep vein thrombosis) 11/08/2012   LLE   History of pulmonary embolus (PE) 11/08/2012   LLL   History of ST elevation myocardial infarction (STEMI) 06/15/2005   inferior wall stemi-- s/p  pci and des x2 to rca   Nocturia    PAC (premature atrial contraction)    S/P drug eluting coronary stent placement 06/15/2005   to mid and ostial RCA   Vertigo    Wears dentures    upper denture and lower partial    Past Surgical History:  Procedure Laterality Date   CARDIAC CATHETERIZATION  10/09/2005   Patent stents, medical therapy   CARDIAC CATHETERIZATION  10/22/2006   Nonobstructive disease, 60% LAD, patent RCA stents   CARDIOVASCULAR STRESS TEST  05-08-2013  dr berry   Low risk nuclear perfusion study w/ basal inferior wal nontransmural infarction and no evidence ischemia/  normal LV function and mild basal inferior wall hypokinesis,  ef 65%   CHOLECYSTECTOMY N/A 07/14/2013   Procedure: LAPAROSCOPIC CHOLECYSTECTOMY;  Surgeon: Jamesetta So, MD;  Location: [facility];  Service: General;  Laterality: N/A;   COLONOSCOPY N/A 08/03/2015   Dr. Gala Romney: diverticulosis in entire colon, one 4 mm tubular adenoma, 5 year surveillance   COLONOSCOPY WITH PROPOFOL N/A 12/02/2020   Procedure: COLONOSCOPY WITH PROPOFOL;  Surgeon: Daneil Dolin, MD;  Location: [facility];  Service: Endoscopy;  Laterality: N/A;  1:00PM   CORONARY ANGIOPLASTY WITH STENT PLACEMENT  06/15/2005  dr Tami Ribas   Cypher stent to mid RCA;  cypher stent -ostial RCA, RESIDUAL disease LAD   CORONARY/GRAFT ACUTE MI REVASCULARIZATION N/A 08/29/2017   Procedure: Coronary/Graft Acute MI Revascularization;  Surgeon: Burnell Blanks, MD;  Location: [facility];  Service: Cardiovascular;  Laterality: N/A;   ESOPHAGOGASTRODUODENOSCOPY N/A 08/03/2015   Dr. Gala Romney: small hiatal hernia, otherwise normal   LAPAROSCOPIC APPENDECTOMY  03/10/2008   LEFT HEART CATH AND CORONARY ANGIOGRAPHY N/A 08/29/2017   Procedure: LEFT HEART CATH AND CORONARY ANGIOGRAPHY;  Surgeon: Burnell Blanks, MD;  Location: [facility];  Service: Cardiovascular;  Laterality: N/A;   LEFT HEART CATHETERIZATION WITH CORONARY ANGIOGRAM N/A 06/12/2014   Procedure: LEFT HEART CATHETERIZATION WITH CORONARY ANGIOGRAM;  Surgeon: Leonie Man, MD;  Location: [facility];  Service: Cardiovascular;  Laterality: N/A;  Minimal CAD w/ widely patent RCA stents;  well-preserved LVEF w/ normal EDP    PENILE PROSTHESIS IMPLANT N/A 09/19/2016   Procedure: INSERTION SEMI-RIGID PENILE PROSTHESIS COLOPLAST;  Surgeon: Irine Seal, MD;  Location: [facility];  Service: Urology;  Laterality: N/A;   POLYPECTOMY  12/02/2020   Procedure: POLYPECTOMY;  Surgeon: Daneil Dolin, MD;  Location: [facility];  Service: Endoscopy;;  sigmoid     Current Outpatient Medications  Medication Sig Dispense Refill   amLODipine (NORVASC) 5 MG tablet TAKE ONE TABLET BY MOUTH ONCE DAILY (Patient taking differently: Take 5 mg by mouth daily.) 90 tablet 3   Ascorbic Acid (VITAMIN C) 1000 MG tablet Take 1,000 mg by mouth daily.     atorvastatin (LIPITOR) 80 MG tablet Take 1 tablet (80 mg total) by mouth daily at 6 PM. (Patient taking differently: Take 80 mg by mouth at bedtime.) 90 tablet 1   cholecalciferol (VITAMIN D3) 25 MCG (1000 UNIT) tablet Take 1,000 Units by mouth daily.     clopidogrel (PLAVIX) 75 MG tablet Take 1 tablet (75 mg total) by mouth daily. KEEP OV. 90 tablet 0    diazepam (VALIUM) 10 MG tablet Take 10 mg by mouth in the morning, at noon, and at bedtime.     dicyclomine (BENTYL) 10 MG capsule TAKE 1 CAPSULE BY MOUTH 3 TIMES DAILY BEFORE MEALS. 270 capsule 3   gabapentin (NEURONTIN) 300 MG capsule Take 300 mg by mouth 3 (three) times daily.     levothyroxine (SYNTHROID) 50 MCG tablet Take 50 mcg by mouth daily before breakfast.     losartan (COZAAR) 50 MG tablet TAKE 1 TABLET(50 MG) BY MOUTH DAILY (Patient taking differently: Take 50 mg by mouth daily.) 90 tablet 0   meclizine (ANTIVERT) 25 MG tablet Take 25 mg by mouth as needed for dizziness (FOR INNER EAR).      metoprolol succinate (TOPROL-XL) 25 MG 24 hr tablet Take 25 mg by mouth daily.     Multiple Vitamin (MULTIVITAMIN WITH MINERALS) TABS tablet Take 1 tablet by mouth  daily.     nitroGLYCERIN (NITROSTAT) 0.4 MG SL tablet Place 1 tablet (0.4 mg total) under the tongue every 5 (five) minutes as needed. 25 tablet 2   Omega-3 Fatty Acids (FISH OIL) 1200 MG CAPS Take 1,200 mg by mouth daily.     polyethylene glycol-electrolytes (TRILYTE) 420 g solution Take 4,000 mLs by mouth as directed. 4000 mL 0   warfarin (COUMADIN) 5 MG tablet Take 5 mg by mouth at bedtime.     omeprazole (PRILOSEC) 40 MG capsule Take 1 capsule (40 mg total) by mouth daily. (Patient taking differently: Take 40 mg by mouth in the morning.) 30 capsule 11   No current facility-administered medications for this visit.    Allergies:   Codeine and Niaspan [niacin er]    Social History:  The patient  reports that he quit smoking about 43 years ago. His smoking use included cigarettes. He has a 1.30 pack-year smoking history. He quit smokeless tobacco use about 52 years ago. He reports that he does not drink alcohol and does not use drugs.   Family History:  The patient's family history includes Colon cancer (age of onset: 38) in his brother; Colon cancer (age of onset: 75) in his paternal uncle and paternal uncle; Colon cancer (age of  onset: 5) in his mother; Prostate cancer in his brother; Pulmonary embolism in his brother; Throat cancer (age of onset: 23) in his brother.    ROS:  Please see the history of present illness.   Otherwise, review of systems are positive for none.   All other systems are reviewed and negative.    PHYSICAL EXAM: VS:  BP (!) 106/54 (BP Location: Left Arm, Patient Position: Sitting, Cuff Size: Large)   Pulse 84   Ht 6' (1.829 m)   Wt 234 lb (106.1 kg)   BMI 31.74 kg/m  , BMI Body mass index is 31.74 kg/m. GEN: Well nourished, well developed, in no acute distress HEENT: sclera anicteric Neck: no JVD, carotid bruits, or masses Cardiac: RRR; no murmurs, rubs, or gallops, 1+ LE edema  Respiratory:  clear to auscultation bilaterally, normal work of breathing GI: soft, obese, nontender, nondistended, + BS MS: no deformity or atrophy Skin: warm and dry, no rash Neuro:  Strength and sensation are intact Psych: euthymic mood, full affect   EKG:  EKG is ordered today. The ekg ordered today demonstrates sinus rhythm, rate 84 bpm, PVC, no STE/D; no significant change from previous.   Recent Labs: No results found for requested labs within last 8760 hours.    Lipid Panel    Component Value Date/Time   CHOL 93 (L) 10/23/2017 1516   TRIG 178 (H) 10/23/2017 1516   HDL 31 (L) 10/23/2017 1516   CHOLHDL 3.0 10/23/2017 1516   CHOLHDL 3.4 08/12/2013 0918   VLDL 34 08/12/2013 0918   LDLCALC 26 10/23/2017 1516      Wt Readings from Last 3 Encounters:  12/28/20 234 lb (106.1 kg)  12/02/20 231 lb (104.8 kg)  11/29/20 231 lb (104.8 kg)      Other studies Reviewed: Additional studies/ records that were reviewed today include:   LHC 2019: Ost RCA to Mid RCA lesion is 30% stenosed. Mid RCA lesion is 99% stenosed. Ost 1st Mrg lesion is 20% stenosed. Prox Cx to Mid Cx lesion is 20% stenosed. Ost LAD to Prox LAD lesion is 30% stenosed. Prox LAD to Mid LAD lesion is 20% stenosed. Mid LAD  to Dist LAD lesion is 20% stenosed.  A drug-eluting stent was successfully placed using a STENT SYNERGY DES 2.75X20. Post intervention, there is a 0% residual stenosis.   1. Acute inferior STEMI 2. Sub-total occlusion of the mid RCA 3. Successful PTCA/DES x 1 mid RCA 4. Mild non-obstructive disease in the LAD and circumflex.    Recommendations: Will admit to [facility] ICU. Will continue DAPT with ASA and Brilinta for one month and then will stop ASA. (He is also on chronic coumadin. Plan would be to continue Brilinta along with coumadin for one year. ). He will be restarted on coumadin tomorrow. INR will be arranged now. If his INR is below 2.0, can consider IV heparin but this should not start before the sheath has been out of his radial artery for 8 hours. He is on coumadin for h/o DVT and PE. Will continue statin, beta blocker. Echo tomorrow.    Diagnostic Dominance: Right Intervention     Echocardiogram 2019: - Left ventricle: The cavity size was normal. Wall thickness was    increased in a pattern of mild LVH. Systolic function was normal.    The estimated ejection fraction was in the range of 55% to 60%.    Basal to mid inferior wall hypokinesis. Doppler parameters are    consistent with abnormal left ventricular relaxation (grade 1    diastolic dysfunction). The E/e&' ratio is between 8-15,    suggesting indeterminate LV filling pressure.  - Mitral valve: Mildly thickened leaflets . There was mild    regurgitation.  - Left atrium: The atrium was normal in size.  - Tricuspid valve: There was mild regurgitation.  - Pulmonary arteries: PA peak pressure: 28 mm Hg (S).  - Inferior vena cava: The vessel was normal in size. The    respirophasic diameter changes were in the normal range (>= 50%),    consistent with normal central venous pressure.   Impressions:   - LVEF 55-60%, mild LVH, basal to mid inferior hypokinesis, grade 1    DD, indeterminate LV fillling pressure, mild MR, normal  LA size,    mild TR, RVSP 28 mmHg, normal IVC.     ASSESSMENT AND PLAN:  1. CAD s/p PCI/DES to [facility] in 2007 and dRCA in 2019: no recent anginal complaints - Continue plavix and statin - Continue Bblocker  2. HTN: BP 106/54 today - Continue amlodipine, metoprolol, and losartan  3. HLD: LDL 47 09/2019 - Will check FLP at his convenience for monitoring of his cholesterol - Continue atorvastatin  4. History of PE/DVT: on coumadin which is managed by PCP  - Continue coumadin  5. GERD: transitioned from protonix to omeprazole which works better for his symptoms - Recommend he reach out to his GI doctor to consider alternative PPI given interaction between omeprazole and plavix   Current medicines are reviewed at length with the patient today.  The patient does not have concerns regarding medicines.  The following changes have been made:  As above  Labs/ tests ordered today include:   Orders Placed This Encounter  Procedures   Lipid panel   EKG 12-Lead     Disposition:   FU with Dr. [name] in 1 year  Signed, Abigail Butts, PA-C  12/28/2020 3:54 PM

## 2020-12-28 NOTE — Patient Instructions (Signed)
Medication Instructions:  No Changes *If you need a refill on your cardiac medications before your next appointment, please call your pharmacy*   Lab Work: Lipid If you have labs (blood work) drawn today and your tests are completely normal, you will receive your results only by: [place] (if you have MyChart) OR A paper copy in the mail If you have any lab test that is abnormal or we need to change your treatment, we will call you to review the results.   Testing/Procedures: No Testing   Follow-Up: At [facility], you and your health needs are our priority.  As part of our continuing mission to provide you with exceptional heart care, we have created designated Provider Care Teams.  These Care Teams include your primary Cardiologist (physician) and Advanced Practice Providers (APPs -  Physician Assistants and Nurse Practitioners) who all work together to provide you with the care you need, when you need it.  We recommend signing up for the patient portal called "MyChart".  Sign up information is provided on this After Visit Summary.  MyChart is used to connect with patients for Virtual Visits (Telemedicine).  Patients are able to view lab/test results, encounter notes, upcoming appointments, etc.  Non-urgent messages can be sent to your provider as well.   To learn more about what you can do with MyChart, go to [email or web address].    Your next appointment:   1 year(s)  The format for your next appointment:   In Person  Provider:   Quay Burow, MD   Other Instructions Please discuss with GI Doctor Alternatives to Omeprazole.

## 2020-12-29 DIAGNOSIS — I2510 Atherosclerotic heart disease of native coronary artery without angina pectoris: Secondary | ICD-10-CM | POA: Diagnosis not present

## 2020-12-29 DIAGNOSIS — E785 Hyperlipidemia, unspecified: Secondary | ICD-10-CM | POA: Diagnosis not present

## 2020-12-29 DIAGNOSIS — K219 Gastro-esophageal reflux disease without esophagitis: Secondary | ICD-10-CM | POA: Diagnosis not present

## 2020-12-29 DIAGNOSIS — I10 Essential (primary) hypertension: Secondary | ICD-10-CM | POA: Diagnosis not present

## 2020-12-29 DIAGNOSIS — Z86711 Personal history of pulmonary embolism: Secondary | ICD-10-CM | POA: Diagnosis not present

## 2020-12-29 DIAGNOSIS — Z86718 Personal history of other venous thrombosis and embolism: Secondary | ICD-10-CM | POA: Diagnosis not present

## 2020-12-30 ENCOUNTER — Other Ambulatory Visit: Payer: Self-pay

## 2020-12-30 LAB — LIPID PANEL
Chol/HDL Ratio: 3.5 ratio (ref 0.0–5.0)
Cholesterol, Total: 118 mg/dL (ref 100–199)
HDL: 34 mg/dL — ABNORMAL LOW (ref >39)
LDL Chol Calc (NIH): 45 mg/dL (ref 0–99)
Triglycerides: 247 mg/dL — ABNORMAL HIGH (ref 0–149)
VLDL Cholesterol Cal: 39 mg/dL (ref 5–40)

## 2020-12-30 MED ORDER — FENOFIBRATE 145 MG PO TABS: 145.0000 mg | ORAL_TABLET | Freq: Every day | ORAL | 0 refills | Status: DC

## 2020-12-30 NOTE — Patient Instructions (Signed)

## 2021-01-13 ENCOUNTER — Other Ambulatory Visit: Payer: Self-pay | Admitting: Medical

## 2021-01-13 ENCOUNTER — Other Ambulatory Visit: Payer: Self-pay | Admitting: Cardiovascular Disease

## 2021-02-08 DIAGNOSIS — Z7901 Long term (current) use of anticoagulants: Secondary | ICD-10-CM | POA: Diagnosis not present

## 2021-02-08 DIAGNOSIS — Z23 Encounter for immunization: Secondary | ICD-10-CM | POA: Diagnosis not present

## 2021-02-15 ENCOUNTER — Telehealth: Payer: Self-pay | Admitting: Cardiovascular Disease

## 2021-02-15 NOTE — Telephone Encounter (Signed)
Left a message for the patient to call back.  

## 2021-02-15 NOTE — Telephone Encounter (Signed)
Granddaughter returned call.  She also want to know if he should be taking amLODipine (NORVASC) 5 MG tablet.   He hasn't been taken it for the past few weeks.

## 2021-02-15 NOTE — Telephone Encounter (Signed)
Patient and daughter called and mentioned that his BP is running low (106/74). Wants to know if he should continue taking BP medication due to it running low

## 2021-02-15 NOTE — Telephone Encounter (Signed)
Returned patient's call regarding his BP. Patient stated his BP and pulse were 106/74 and 87 and felt dizzy this morning. He has not been taking amlodipine for the past two months. He is taking metoprolol 25 mg and losartan 50 mg daily. He did mention that his cholesterol medication is together with amlodipine in the same container. Received permission from the patient to speak with his granddaughter Larene Beach [phone] regarding his medications. Advised patient to check his BP and HR and record it every day. Spoke with Larene Beach. She stated the patient had mixed his amlodipine in an unlabeled bottle with cholesterol medication. She did a pill search to identify the pills and separated them. She stated it has been several weeks that he has not been taking the amlodipine. Larene Beach agreed for patient not to take the amlodipine, but to continue with other medications as ordered. Randa Ngo that she will be notified of what Roby Lofts, PA answers regarding this notation.

## 2021-02-16 MED ORDER — LOSARTAN POTASSIUM 25 MG PO TABS: 25.0000 mg | ORAL_TABLET | Freq: Every day | ORAL | 3 refills | Status: AC

## 2021-02-16 NOTE — Telephone Encounter (Signed)
Spoke with patient and granddaughter Larene Beach for patient to reduce losartan to 25 mg once a day, to monitor and log BP for the next 2 weeks, and to notify clinic if BP is persistently  above 130/80. Order for losartan 25 mg once daily ordered.

## 2021-02-23 DIAGNOSIS — Z79899 Other long term (current) drug therapy: Secondary | ICD-10-CM | POA: Diagnosis not present

## 2021-02-28 ENCOUNTER — Other Ambulatory Visit: Payer: Self-pay | Admitting: Medical

## 2021-03-01 DIAGNOSIS — E1140 Type 2 diabetes mellitus with diabetic neuropathy, unspecified: Secondary | ICD-10-CM | POA: Diagnosis not present

## 2021-03-01 DIAGNOSIS — I10 Essential (primary) hypertension: Secondary | ICD-10-CM | POA: Diagnosis not present

## 2021-03-01 DIAGNOSIS — Z13220 Encounter for screening for lipoid disorders: Secondary | ICD-10-CM | POA: Diagnosis not present

## 2021-03-01 DIAGNOSIS — Z9229 Personal history of other drug therapy: Secondary | ICD-10-CM | POA: Diagnosis not present

## 2021-03-01 DIAGNOSIS — Z125 Encounter for screening for malignant neoplasm of prostate: Secondary | ICD-10-CM | POA: Diagnosis not present

## 2021-03-01 DIAGNOSIS — E6609 Other obesity due to excess calories: Secondary | ICD-10-CM | POA: Diagnosis not present

## 2021-03-01 DIAGNOSIS — Z6830 Body mass index (BMI) 30.0-30.9, adult: Secondary | ICD-10-CM | POA: Diagnosis not present

## 2021-03-01 DIAGNOSIS — Z1331 Encounter for screening for depression: Secondary | ICD-10-CM | POA: Diagnosis not present

## 2021-03-01 DIAGNOSIS — Z0000 Encounter for general adult medical examination without abnormal findings: Secondary | ICD-10-CM | POA: Diagnosis not present

## 2021-03-01 DIAGNOSIS — E119 Type 2 diabetes mellitus without complications: Secondary | ICD-10-CM | POA: Diagnosis not present

## 2021-03-10 ENCOUNTER — Telehealth: Payer: Self-pay | Admitting: Cardiovascular Disease

## 2021-03-10 NOTE — Telephone Encounter (Signed)
*  STAT* If patient is at the pharmacy, call can be transferred to refill team.   1. Which medications need to be refilled? (please list name of each medication and dose if known) clopidogrel (PLAVIX) 75 MG tablet  2. Which pharmacy/location (including street and city if local pharmacy) is medication to be sent to?[id number] - [place], [place] - [place]  3. Do they need a 30 day or 90 day supply? 90 day

## 2021-03-11 ENCOUNTER — Telehealth: Payer: Self-pay | Admitting: Cardiovascular Disease

## 2021-03-11 MED ORDER — CLOPIDOGREL BISULFATE 75 MG PO TABS: 75.0000 mg | ORAL_TABLET | Freq: Every day | ORAL | 3 refills | Status: DC

## 2021-03-11 NOTE — Telephone Encounter (Signed)
Refilled medication plavix for 90 day supply  with 3 refills

## 2021-03-11 NOTE — Telephone Encounter (Signed)
Refilled with previous  refill message

## 2021-03-11 NOTE — Telephone Encounter (Signed)
*  STAT* If patient is at the pharmacy, call can be transferred to refill team.   1. Which medications need to be refilled? (please list name of each medication and dose if known) clopidogrel (PLAVIX) 75 MG tablet  2. Which pharmacy/location (including street and city if local pharmacy) is medication to be sent to?  [id number] - [place], [place] Phone:  [phone]  Fax:  [phone]      3. Do they need a 30 day or 90 day supply? 90 ds

## 2021-03-17 ENCOUNTER — Telehealth: Payer: Self-pay | Admitting: Cardiovascular Disease

## 2021-03-17 NOTE — Telephone Encounter (Signed)
*  STAT* If patient is at the pharmacy, call can be transferred to refill team.   1. Which medications need to be refilled? (please list name of each medication and dose if known) fenofibrate (TRICOR) 145 MG tablet  2. Which pharmacy/location (including street and city if local pharmacy) is medication to be sent to? [id number] [place], [place] - [place]  3. Do they need a 30 day or 90 day supply? 90 day supply

## 2021-04-01 DIAGNOSIS — Z79899 Other long term (current) drug therapy: Secondary | ICD-10-CM | POA: Diagnosis not present

## 2021-04-11 DIAGNOSIS — Z7901 Long term (current) use of anticoagulants: Secondary | ICD-10-CM | POA: Diagnosis not present

## 2021-05-11 DIAGNOSIS — Z7901 Long term (current) use of anticoagulants: Secondary | ICD-10-CM | POA: Diagnosis not present

## 2021-05-18 DIAGNOSIS — Z7901 Long term (current) use of anticoagulants: Secondary | ICD-10-CM | POA: Diagnosis not present

## 2021-06-14 DIAGNOSIS — Z7901 Long term (current) use of anticoagulants: Secondary | ICD-10-CM | POA: Diagnosis not present

## 2021-06-24 DIAGNOSIS — Z7901 Long term (current) use of anticoagulants: Secondary | ICD-10-CM | POA: Diagnosis not present

## 2021-07-17 ENCOUNTER — Other Ambulatory Visit: Payer: Self-pay | Admitting: Internal Medicine

## 2021-07-17 DIAGNOSIS — K219 Gastro-esophageal reflux disease without esophagitis: Secondary | ICD-10-CM

## 2021-07-18 NOTE — Telephone Encounter (Signed)
RX sent

## 2021-07-18 NOTE — Telephone Encounter (Signed)
Last office visit 10/05/20 ?

## 2021-07-27 ENCOUNTER — Telehealth: Payer: Self-pay | Admitting: Cardiovascular Disease

## 2021-07-27 MED ORDER — FENOFIBRATE 145 MG PO TABS: 145.0000 mg | ORAL_TABLET | Freq: Every day | ORAL | 3 refills | Status: DC

## 2021-07-27 MED ORDER — CLOPIDOGREL BISULFATE 75 MG PO TABS: 75.0000 mg | ORAL_TABLET | Freq: Every day | ORAL | 3 refills | Status: DC

## 2021-07-27 NOTE — Telephone Encounter (Signed)
?*  STAT* If patient is at the pharmacy, call can be transferred to refill team. ? ? ?1. Which medications need to be refilled? (please list name of each medication and dose if known)  ?clopidogrel (PLAVIX) 75 MG tablet ?fenofibrate (TRICOR) 145 MG tablet ? ?2. Which pharmacy/location (including street and city if local pharmacy) is medication to be sent to? [facility] ? ?3. Do they need a 30 day or 90 day supply? 90 day  ?

## 2021-08-01 ENCOUNTER — Telehealth: Payer: Self-pay | Admitting: Cardiovascular Disease

## 2021-08-01 NOTE — Telephone Encounter (Signed)
?*  STAT* If patient is at the pharmacy, call can be transferred to refill team. ? ? ?1. Which medications need to be refilled? (please list name of each medication and dose if known)  ? clopidogrel (PLAVIX) 75 MG tablet  ?fenofibrate (TRICOR) 145 MG tablet ? ?2. Which pharmacy/location (including street and city if local pharmacy) is medication to be sent to? ?[facility] ?[id number] ?[place] [address] ?3. Do they need a 30 day or 90 day supply? 90 day ?

## 2021-08-02 DIAGNOSIS — E6609 Other obesity due to excess calories: Secondary | ICD-10-CM | POA: Diagnosis not present

## 2021-08-02 DIAGNOSIS — R351 Nocturia: Secondary | ICD-10-CM | POA: Diagnosis not present

## 2021-08-02 DIAGNOSIS — Z6830 Body mass index (BMI) 30.0-30.9, adult: Secondary | ICD-10-CM | POA: Diagnosis not present

## 2021-08-02 DIAGNOSIS — E1140 Type 2 diabetes mellitus with diabetic neuropathy, unspecified: Secondary | ICD-10-CM | POA: Diagnosis not present

## 2021-08-02 DIAGNOSIS — H8110 Benign paroxysmal vertigo, unspecified ear: Secondary | ICD-10-CM | POA: Diagnosis not present

## 2021-08-02 NOTE — Telephone Encounter (Signed)
Medication was refilled 07/27/2021. ?

## 2021-08-10 DIAGNOSIS — Z7901 Long term (current) use of anticoagulants: Secondary | ICD-10-CM | POA: Diagnosis not present

## 2021-08-12 DIAGNOSIS — R69 Illness, unspecified: Secondary | ICD-10-CM | POA: Diagnosis not present

## 2021-08-24 DIAGNOSIS — Z79899 Other long term (current) drug therapy: Secondary | ICD-10-CM | POA: Diagnosis not present

## 2021-09-08 ENCOUNTER — Telehealth: Payer: Self-pay | Admitting: Cardiovascular Disease

## 2021-09-08 NOTE — Telephone Encounter (Signed)
?*  STAT* If patient is at the pharmacy, call can be transferred to refill team. ? ? ?1. Which medications need to be refilled? (please list name of each medication and dose if known) clopidogrel (PLAVIX) 75 MG tablet ? ?fenofibrate (TRICOR) 145 MG tablet ? ?2. Which pharmacy/location (including street and city if local pharmacy) is medication to be sent to? [place], [place] ? ?3. Do they need a 30 day or 90 day supply? 90 ? ?

## 2021-09-09 MED ORDER — CLOPIDOGREL BISULFATE 75 MG PO TABS: 75.0000 mg | ORAL_TABLET | Freq: Every day | ORAL | 3 refills | Status: DC

## 2021-09-09 MED ORDER — FENOFIBRATE 145 MG PO TABS: 145.0000 mg | ORAL_TABLET | Freq: Every day | ORAL | 3 refills | Status: DC

## 2021-09-20 DIAGNOSIS — Z79899 Other long term (current) drug therapy: Secondary | ICD-10-CM | POA: Diagnosis not present

## 2021-10-05 DIAGNOSIS — Z79899 Other long term (current) drug therapy: Secondary | ICD-10-CM | POA: Diagnosis not present

## 2021-10-13 DIAGNOSIS — Z79899 Other long term (current) drug therapy: Secondary | ICD-10-CM | POA: Diagnosis not present

## 2021-11-08 ENCOUNTER — Encounter: Payer: Self-pay | Admitting: Cardiovascular Disease

## 2021-11-08 ENCOUNTER — Ambulatory Visit: Payer: Medicare HMO | Admitting: Cardiovascular Disease

## 2021-11-08 VITALS — BP 122/71 | HR 76 | Ht 72.0 in | Wt 218.0 lb

## 2021-11-08 DIAGNOSIS — Z9861 Coronary angioplasty status: Secondary | ICD-10-CM | POA: Diagnosis not present

## 2021-11-08 DIAGNOSIS — Z7901 Long term (current) use of anticoagulants: Secondary | ICD-10-CM | POA: Diagnosis not present

## 2021-11-08 DIAGNOSIS — I10 Essential (primary) hypertension: Secondary | ICD-10-CM | POA: Diagnosis not present

## 2021-11-08 DIAGNOSIS — I2510 Atherosclerotic heart disease of native coronary artery without angina pectoris: Secondary | ICD-10-CM | POA: Diagnosis not present

## 2021-11-08 DIAGNOSIS — E785 Hyperlipidemia, unspecified: Secondary | ICD-10-CM

## 2021-11-08 NOTE — Progress Notes (Signed)
11/08/2021 Steven Oneal   03/23/1946  [id number]  Primary Physician Steven School, MD Primary Cardiologist: Steven Harp MD FACP, [place], [place], [place]  HPI:  Steven Oneal is a 76 y.o.  who has I last saw in the office   10/30/2018. He has  a history of CAD status post PCI and stenting of his RCA by Steven Oneal Feb 2007 in the setting of an MI. He was restudied in June 2008, and had patent stents in his proximal RCA and noncritical disease in his proximal LAD with normal LV function. He has not had a Myoview since and has done well from a cardiac standpoint. He did have a PE/DVT in July 2014 and is on chronic Coumadin. He recently saw Steven Oneal after he had palpitations and chest pain Thanksgiving night. The pt says his heart was racing, he was sweaty, and he had pain across his chest. He told me "thought I was having a heart attack". Steven Oneal increased his Coreg and the pt stopped his Niaspan suspecting this had something to do with his symptoms, he had been on this since 2008. Since he stopped the Niaspan he has done well. He denies any further chest pain or palpations. His Holter showed PACs, PVCs, and sinus arrythmia with transient bradycardia and sinus pauses up to 2.7 seconds.  A Myoview stress test performed 05/08/13 showed inferobasal scar with mild inferolateral hypokinesis unchanged from prior functional studies. Since I saw him last he has remained asymptomatic. He had a uncomplicated penile Implant performed by Steven. Jeffie Oneal .  He developed chest pain on 08/29/2017 and was brought emergently to the [facility] where Steven. Angelena Oneal demonstrated a patent proximal RCA stent, 99% distal RCA at the Jan you and essentially normal left system.  He placed a synergy 2.75 x 20 mm long drug-eluting stent in the distal RCA post dilating with a 3 mm balloon.  He was discharged home on Brilinta and Coumadin with the intent to transition to Plavix in 3 months.    Since I saw him in the  office 3 years ago he continues to do well.  He is taking care of his wife at home who has progressive dementia.  He remains on Coumadin anticoagulation followed by his PCP.  He denies chest pain or shortness of breath.   Current Meds  Medication Sig   amLODipine (NORVASC) 5 MG tablet TAKE ONE TABLET BY MOUTH ONCE DAILY (Patient taking differently: Take 5 mg by mouth daily.)   Ascorbic Acid (VITAMIN C) 1000 MG tablet Take 1,000 mg by mouth daily.   atorvastatin (LIPITOR) 80 MG tablet Take 1 tablet (80 mg total) by mouth daily at 6 PM. (Patient taking differently: Take 80 mg by mouth at bedtime.)   cholecalciferol (VITAMIN D3) 25 MCG (1000 UNIT) tablet Take 1,000 Units by mouth daily.   clopidogrel (PLAVIX) 75 MG tablet Take 1 tablet (75 mg total) by mouth daily. KEEP OV.   diazepam (VALIUM) 10 MG tablet Take 10 mg by mouth in the morning, at noon, and at bedtime.   dicyclomine (BENTYL) 10 MG capsule TAKE 1 CAPSULE BY MOUTH 3 TIMES DAILY BEFORE MEALS.   fenofibrate (TRICOR) 145 MG tablet Take 1 tablet (145 mg total) by mouth daily.   levothyroxine (SYNTHROID) 50 MCG tablet Take 50 mcg by mouth daily before breakfast.   losartan (COZAAR) 25 MG tablet Take 1 tablet (25 mg total) by mouth daily.   meclizine (ANTIVERT) 25  MG tablet Take 25 mg by mouth as needed for dizziness (FOR INNER EAR).    metoprolol succinate (TOPROL-XL) 25 MG 24 hr tablet Take 25 mg by mouth daily.   Multiple Vitamin (MULTIVITAMIN WITH MINERALS) TABS tablet Take 1 tablet by mouth daily.   nitroGLYCERIN (NITROSTAT) 0.4 MG SL tablet Place 1 tablet (0.4 mg total) under the tongue every 5 (five) minutes as needed.   Omega-3 Fatty Acids (FISH OIL) 1200 MG CAPS Take 1,200 mg by mouth daily.   omeprazole (PRILOSEC) 40 MG capsule Take 1 capsule (40 mg total) by mouth daily.   polyethylene glycol-electrolytes (TRILYTE) 420 g solution Take 4,000 mLs by mouth as directed.   warfarin (COUMADIN) 5 MG tablet Take 5 mg by mouth at bedtime.      Allergies  Allergen Reactions   Codeine Hives, Itching and Nausea Only   Niaspan [Niacin Er] Other (See Comments)    MAKES SKIN FLUSH EXCESSIVELY    Social History   Socioeconomic History   Marital status: Married    Spouse name: Not on file   Number of children: 2   Years of education: Not on file   Highest education level: Not on file  Occupational History   Occupation: disablity    Comment: CAD  Tobacco Use   Smoking status: Former    Packs/day: 0.10    Years: 13.00    Total pack years: 1.30    Types: Cigarettes    Quit date: 05/27/1977    Years since quitting: 44.4   Smokeless tobacco: Former    Quit date: 09/08/1968  Substance and Sexual Activity   Alcohol use: No   Drug use: No   Sexual activity: Yes    Partners: Female    Birth control/protection: None  Other Topics Concern   Not on file  Social History Narrative   Not on file   Social Determinants of Health   Financial Resource Strain: Not on file  Food Insecurity: Not on file  Transportation Needs: Not on file  Physical Activity: Not on file  Stress: Not on file  Social Connections: Not on file  Intimate Partner Violence: Not on file     Review of Systems: General: negative for chills, fever, night sweats or weight changes.  Cardiovascular: negative for chest pain, dyspnea on exertion, edema, orthopnea, palpitations, paroxysmal nocturnal dyspnea or shortness of breath Dermatological: negative for rash Respiratory: negative for cough or wheezing Urologic: negative for hematuria Abdominal: negative for nausea, vomiting, diarrhea, bright red blood per rectum, melena, or hematemesis Neurologic: negative for visual changes, syncope, or dizziness All other systems reviewed and are otherwise negative except as noted above.    Blood pressure 122/71, pulse 76, height 6' (1.829 m), weight 218 lb (98.9 kg), SpO2 97 %.  General appearance: alert and no distress Neck: no adenopathy, no carotid bruit,  no JVD, supple, symmetrical, trachea midline, and thyroid not enlarged, symmetric, no tenderness/mass/nodules Lungs: clear to auscultation bilaterally Heart: regular rate and rhythm, S1, S2 normal, no murmur, click, rub or gallop Extremities: extremities normal, atraumatic, no cyanosis or edema Pulses: 2+ and symmetric Skin: Skin color, texture, turgor normal. No rashes or lesions Neurologic: Grossly normal  EKG sinus rhythm at 76 without ST or T wave changes.  Personally reviewed this EKG.  ASSESSMENT AND PLAN:   CAD-s/p PCI History of CAD status post RCA stenting by myself February 2007 in the setting of inferior STEMI.  He was restarted in June 2008 and to have patent stents  with noncritical disease.  Because of chest pain he underwent right recatheterization by Steven. Angelena Oneal 08/29/2017 revealing a patent proximal RCA stent with 99% stenosis at the genu of the RCA.  This was stented with a 2.75 mm x 20 mm long Synergy drug-eluting stent postdilated to 3 mm.  He has had no recurrent symptoms.  Dyslipidemia History of dyslipidemia on high-dose statin therapy with lipid profile performed 03/02/2021 revealing total cholesterol 129, LDL 58 and HDL 30.  Essential hypertension History of essential hypertension a blood pressure measured today at 122/71.  He is on amlodipine, losartan and metoprolol.  Chronic anticoagulation for PE, DVT History of DVT/PE on chronic Coumadin anticoagulation followed by Steven. Gerarda Fraction .     Steven Harp MD [facility], [facility] 11/08/2021 11:17 AM

## 2021-11-08 NOTE — Assessment & Plan Note (Signed)
History of CAD status post RCA stenting by myself February 2007 in the setting of inferior STEMI.  He was restarted in June 2008 and to have patent stents with noncritical disease.  Because of chest pain he underwent right recatheterization by Dr. Angelena Form 08/29/2017 revealing a patent proximal RCA stent with 99% stenosis at the genu of the RCA.  This was stented with a 2.75 mm x 20 mm long Synergy drug-eluting stent postdilated to 3 mm.  He has had no recurrent symptoms.

## 2021-11-08 NOTE — Assessment & Plan Note (Signed)
History of dyslipidemia on high-dose statin therapy with lipid profile performed 03/02/2021 revealing total cholesterol 129, LDL 58 and HDL 30.

## 2021-11-08 NOTE — Assessment & Plan Note (Signed)
History of essential hypertension a blood pressure measured today at 122/71.  He is on amlodipine, losartan and metoprolol.

## 2021-11-08 NOTE — Patient Instructions (Signed)

## 2021-11-08 NOTE — Assessment & Plan Note (Signed)
History of DVT/PE on chronic Coumadin anticoagulation followed by Dr. Gerarda Fraction .

## 2021-11-23 DIAGNOSIS — Z0001 Encounter for general adult medical examination with abnormal findings: Secondary | ICD-10-CM | POA: Diagnosis not present

## 2021-11-23 DIAGNOSIS — E663 Overweight: Secondary | ICD-10-CM | POA: Diagnosis not present

## 2021-11-23 DIAGNOSIS — I700 Atherosclerosis of aorta: Secondary | ICD-10-CM | POA: Diagnosis not present

## 2021-11-23 DIAGNOSIS — E039 Hypothyroidism, unspecified: Secondary | ICD-10-CM | POA: Diagnosis not present

## 2021-11-23 DIAGNOSIS — Z79899 Other long term (current) drug therapy: Secondary | ICD-10-CM | POA: Diagnosis not present

## 2021-11-23 DIAGNOSIS — E538 Deficiency of other specified B group vitamins: Secondary | ICD-10-CM | POA: Diagnosis not present

## 2021-11-23 DIAGNOSIS — E559 Vitamin D deficiency, unspecified: Secondary | ICD-10-CM | POA: Diagnosis not present

## 2021-11-23 DIAGNOSIS — E1140 Type 2 diabetes mellitus with diabetic neuropathy, unspecified: Secondary | ICD-10-CM | POA: Diagnosis not present

## 2021-11-23 DIAGNOSIS — E782 Mixed hyperlipidemia: Secondary | ICD-10-CM | POA: Diagnosis not present

## 2021-11-23 DIAGNOSIS — E291 Testicular hypofunction: Secondary | ICD-10-CM | POA: Diagnosis not present

## 2021-11-23 DIAGNOSIS — Z125 Encounter for screening for malignant neoplasm of prostate: Secondary | ICD-10-CM | POA: Diagnosis not present

## 2021-11-23 DIAGNOSIS — D518 Other vitamin B12 deficiency anemias: Secondary | ICD-10-CM | POA: Diagnosis not present

## 2021-11-23 DIAGNOSIS — Z1331 Encounter for screening for depression: Secondary | ICD-10-CM | POA: Diagnosis not present

## 2021-11-23 DIAGNOSIS — I10 Essential (primary) hypertension: Secondary | ICD-10-CM | POA: Diagnosis not present

## 2021-12-08 DIAGNOSIS — Z79899 Other long term (current) drug therapy: Secondary | ICD-10-CM | POA: Diagnosis not present

## 2021-12-09 ENCOUNTER — Other Ambulatory Visit: Payer: Self-pay | Admitting: Gastroenterology

## 2021-12-16 DIAGNOSIS — Z79899 Other long term (current) drug therapy: Secondary | ICD-10-CM | POA: Diagnosis not present

## 2021-12-26 DIAGNOSIS — J1189 Influenza due to unidentified influenza virus with other manifestations: Secondary | ICD-10-CM | POA: Diagnosis not present

## 2021-12-27 DIAGNOSIS — Z79899 Other long term (current) drug therapy: Secondary | ICD-10-CM | POA: Diagnosis not present

## 2022-01-10 ENCOUNTER — Telehealth: Payer: Self-pay | Admitting: Cardiovascular Disease

## 2022-01-10 MED ORDER — ASPIRIN 81 MG PO TBEC: 81.0000 mg | DELAYED_RELEASE_TABLET | Freq: Every day | ORAL | 3 refills | Status: DC

## 2022-01-10 NOTE — Telephone Encounter (Signed)
Patient has a concern over taking plavix and warfarin. His pharmacist told him to check with cardiologist regarding the medications. Please advise.

## 2022-01-10 NOTE — Telephone Encounter (Signed)
Pt c/o medication issue:  1. Name of Medication:  Plavix and warfarin (COUMADIN) 5 MG tablet [[id number]] 2. How are you currently taking this medication (dosage and times per day)?   3. Are you having a reaction (difficulty breathing--STAT)? NA  4. What is your medication issue? Pt stated he went to the pharmacy and the pharmacist told him that these 2 meds are working against each other and he wanted to ask Dr [name] about it.   Best number [phone]

## 2022-01-10 NOTE — Telephone Encounter (Signed)
Patient informed to stop taking plavix and to start taking aspirin '81mg'$  daily. Also recommended patient contact his coumadin clinic which is [facility] to update them. Patient verbalized understanding.

## 2022-01-20 DIAGNOSIS — Z79899 Other long term (current) drug therapy: Secondary | ICD-10-CM | POA: Diagnosis not present

## 2022-01-30 DIAGNOSIS — E1165 Type 2 diabetes mellitus with hyperglycemia: Secondary | ICD-10-CM | POA: Diagnosis not present

## 2022-02-22 DIAGNOSIS — Z79899 Other long term (current) drug therapy: Secondary | ICD-10-CM | POA: Diagnosis not present

## 2022-02-22 DIAGNOSIS — Z23 Encounter for immunization: Secondary | ICD-10-CM | POA: Diagnosis not present

## 2022-03-14 DIAGNOSIS — R42 Dizziness and giddiness: Secondary | ICD-10-CM | POA: Diagnosis not present

## 2022-03-14 DIAGNOSIS — H8193 Unspecified disorder of vestibular function, bilateral: Secondary | ICD-10-CM | POA: Diagnosis not present

## 2022-03-29 DIAGNOSIS — Z79899 Other long term (current) drug therapy: Secondary | ICD-10-CM | POA: Diagnosis not present

## 2022-05-02 DIAGNOSIS — Z79899 Other long term (current) drug therapy: Secondary | ICD-10-CM | POA: Diagnosis not present

## 2022-06-01 DIAGNOSIS — Z79899 Other long term (current) drug therapy: Secondary | ICD-10-CM | POA: Diagnosis not present

## 2022-06-19 DIAGNOSIS — E6609 Other obesity due to excess calories: Secondary | ICD-10-CM | POA: Diagnosis not present

## 2022-06-19 DIAGNOSIS — I700 Atherosclerosis of aorta: Secondary | ICD-10-CM | POA: Diagnosis not present

## 2022-06-19 DIAGNOSIS — E1140 Type 2 diabetes mellitus with diabetic neuropathy, unspecified: Secondary | ICD-10-CM | POA: Diagnosis not present

## 2022-06-19 DIAGNOSIS — I10 Essential (primary) hypertension: Secondary | ICD-10-CM | POA: Diagnosis not present

## 2022-06-19 DIAGNOSIS — E1165 Type 2 diabetes mellitus with hyperglycemia: Secondary | ICD-10-CM | POA: Diagnosis not present

## 2022-06-19 DIAGNOSIS — I2699 Other pulmonary embolism without acute cor pulmonale: Secondary | ICD-10-CM | POA: Diagnosis not present

## 2022-06-19 DIAGNOSIS — Z6832 Body mass index (BMI) 32.0-32.9, adult: Secondary | ICD-10-CM | POA: Diagnosis not present

## 2022-06-19 DIAGNOSIS — T83410A Breakdown (mechanical) of penile (implanted) prosthesis, initial encounter: Secondary | ICD-10-CM | POA: Diagnosis not present

## 2022-08-03 DIAGNOSIS — Z79899 Other long term (current) drug therapy: Secondary | ICD-10-CM | POA: Diagnosis not present

## 2022-08-18 ENCOUNTER — Other Ambulatory Visit: Payer: Self-pay | Admitting: Cardiovascular Disease

## 2022-09-07 ENCOUNTER — Other Ambulatory Visit: Payer: Self-pay | Admitting: Internal Medicine

## 2022-09-07 DIAGNOSIS — K219 Gastro-esophageal reflux disease without esophagitis: Secondary | ICD-10-CM

## 2022-10-03 DIAGNOSIS — Z79899 Other long term (current) drug therapy: Secondary | ICD-10-CM | POA: Diagnosis not present

## 2022-11-06 DIAGNOSIS — Z79899 Other long term (current) drug therapy: Secondary | ICD-10-CM | POA: Diagnosis not present

## 2022-11-27 DIAGNOSIS — E1165 Type 2 diabetes mellitus with hyperglycemia: Secondary | ICD-10-CM | POA: Diagnosis not present

## 2022-11-27 DIAGNOSIS — Z1331 Encounter for screening for depression: Secondary | ICD-10-CM | POA: Diagnosis not present

## 2022-11-27 DIAGNOSIS — E291 Testicular hypofunction: Secondary | ICD-10-CM | POA: Diagnosis not present

## 2022-11-27 DIAGNOSIS — E559 Vitamin D deficiency, unspecified: Secondary | ICD-10-CM | POA: Diagnosis not present

## 2022-11-27 DIAGNOSIS — E6609 Other obesity due to excess calories: Secondary | ICD-10-CM | POA: Diagnosis not present

## 2022-11-27 DIAGNOSIS — E1140 Type 2 diabetes mellitus with diabetic neuropathy, unspecified: Secondary | ICD-10-CM | POA: Diagnosis not present

## 2022-11-27 DIAGNOSIS — I10 Essential (primary) hypertension: Secondary | ICD-10-CM | POA: Diagnosis not present

## 2022-11-27 DIAGNOSIS — I700 Atherosclerosis of aorta: Secondary | ICD-10-CM | POA: Diagnosis not present

## 2022-11-27 DIAGNOSIS — D518 Other vitamin B12 deficiency anemias: Secondary | ICD-10-CM | POA: Diagnosis not present

## 2022-11-27 DIAGNOSIS — Z0001 Encounter for general adult medical examination with abnormal findings: Secondary | ICD-10-CM | POA: Diagnosis not present

## 2022-11-27 DIAGNOSIS — G9332 Myalgic encephalomyelitis/chronic fatigue syndrome: Secondary | ICD-10-CM | POA: Diagnosis not present

## 2022-11-27 DIAGNOSIS — Z125 Encounter for screening for malignant neoplasm of prostate: Secondary | ICD-10-CM | POA: Diagnosis not present

## 2022-11-27 DIAGNOSIS — E782 Mixed hyperlipidemia: Secondary | ICD-10-CM | POA: Diagnosis not present

## 2022-12-12 DIAGNOSIS — Z79899 Other long term (current) drug therapy: Secondary | ICD-10-CM | POA: Diagnosis not present

## 2023-01-02 ENCOUNTER — Ambulatory Visit: Payer: Medicare HMO | Attending: Cardiovascular Disease | Admitting: Cardiovascular Disease

## 2023-01-02 ENCOUNTER — Encounter: Payer: Self-pay | Admitting: Cardiovascular Disease

## 2023-01-02 DIAGNOSIS — I10 Essential (primary) hypertension: Secondary | ICD-10-CM | POA: Diagnosis not present

## 2023-01-02 DIAGNOSIS — R0989 Other specified symptoms and signs involving the circulatory and respiratory systems: Secondary | ICD-10-CM

## 2023-01-02 DIAGNOSIS — Z79899 Other long term (current) drug therapy: Secondary | ICD-10-CM | POA: Diagnosis not present

## 2023-01-02 DIAGNOSIS — R079 Chest pain, unspecified: Secondary | ICD-10-CM | POA: Diagnosis not present

## 2023-01-02 DIAGNOSIS — E785 Hyperlipidemia, unspecified: Secondary | ICD-10-CM

## 2023-01-02 DIAGNOSIS — I2510 Atherosclerotic heart disease of native coronary artery without angina pectoris: Secondary | ICD-10-CM

## 2023-01-02 DIAGNOSIS — Z9861 Coronary angioplasty status: Secondary | ICD-10-CM

## 2023-01-02 DIAGNOSIS — Z7901 Long term (current) use of anticoagulants: Secondary | ICD-10-CM

## 2023-01-02 NOTE — Assessment & Plan Note (Signed)
History of dyslipidemia on high-dose atorvastatin and fibrate with lipid profile performed 11/28/2022 revealing total cholesterol 118, LDL 44 and HDL 38.

## 2023-01-02 NOTE — Patient Instructions (Addendum)
Medication Instructions:  Your physician recommends that you continue on your current medications as directed. Please refer to the Current Medication list given to you today.  *If you need a refill on your cardiac medications before your next appointment, please call your pharmacy*   Testing/Procedures: Your physician has requested that you have a carotid duplex. This test is an ultrasound of the carotid arteries in your neck. It looks at blood flow through these arteries that supply the brain with blood. Allow one hour for this exam. There are no restrictions or special instructions. This will take place at [place], [address].   Follow-Up: At [facility], you and your health needs are our priority.  As part of our continuing mission to provide you with exceptional heart care, we have created designated Provider Care Teams.  These Care Teams include your primary Cardiologist (physician) and Advanced Practice Providers (APPs -  Physician Assistants and Nurse Practitioners) who all work together to provide you with the care you need, when you need it.  We recommend signing up for the patient portal called "MyChart".  Sign up information is provided on this After Visit Summary.  MyChart is used to connect with patients for Virtual Visits (Telemedicine).  Patients are able to view lab/test results, encounter notes, upcoming appointments, etc.  Non-urgent messages can be sent to your provider as well.   To learn more about what you can do with MyChart, go to [email or web address].    Your next appointment:   12 month(s)  Provider:   Quay Burow, MD

## 2023-01-02 NOTE — Assessment & Plan Note (Signed)
History of CAD post inferior STEMI February 2007.  I performed PCI and stenting of his RCA at that time.  He is restudied June 2008 and found to have patent stents in the proximal RCA noncritical disease with proximal LAD with normal LV function.  Dr. Clifton James starting him in the setting of chest pain 08/29/2017 revealing patent proximal LAD stent, 99% distal RCA stenosis at the genu with essentially normal left system.  He underwent PCI stenting using a 2.75 mm x 20 mm long Synergy drug-eluting stent postdilated to 3 mm.  He has remained asymptomatic.

## 2023-01-02 NOTE — Assessment & Plan Note (Signed)
History of essential hypertension blood pressure measured today at 112/62.  He is on amlodipine, losartan and metoprolol.

## 2023-01-02 NOTE — Assessment & Plan Note (Signed)
History of DVT/pulmonary embolism on Coumadin anticoagulation.

## 2023-01-02 NOTE — Progress Notes (Signed)
01/02/2023 Steven Oneal   [birth year]  [id number]  Primary Physician Elfredia Nevins, MD Primary Cardiologist: Runell Gess MD FACP, [place], [place], [place]  HPI:  Steven Oneal is a 77 y.o. recently widowed (wife of 46 years passed away on Christmas Day 2023), father of 2 boys, grandfather of 78 grandchildren who has I last saw in the office 11/08/2021. He has  a history of CAD status post PCI and stenting of his RCA by Dr Allyson Sabal Feb 2007 in the setting of an MI. He was restudied in June 2008, and had patent stents in his proximal RCA and noncritical disease in his proximal LAD with normal LV function. He has not had a Myoview since and has done well from a cardiac standpoint. He did have a PE/DVT in July 2014 and is on chronic Coumadin. He recently saw Wilburt Finlay after he had palpitations and chest pain Thanksgiving night. The pt says his heart was racing, he was sweaty, and he had pain across his chest. He told me "thought I was having a heart attack". Judie Grieve increased his Coreg and the pt stopped his Niaspan suspecting this had something to do with his symptoms, he had been on this since 2008. Since he stopped the Niaspan he has done well. He denies any further chest pain or palpations. His Holter showed PACs, PVCs, and sinus arrythmia with transient bradycardia and sinus pauses up to 2.7 seconds.  A Myoview stress test performed 05/08/13 showed inferobasal scar with mild inferolateral hypokinesis unchanged from prior functional studies. Since I saw him last he has remained asymptomatic. He had a uncomplicated penile Implant performed by Dr. Annabell Howells .   He developed chest pain on 08/29/2017 and was brought emergently to the [facility] where Dr. Clifton James demonstrated a patent proximal RCA stent, 99% distal RCA at the Jan you and essentially normal left system.  He placed a synergy 2.75 x 20 mm long drug-eluting stent in the distal RCA post dilating with a 3 mm balloon.  He was  discharged home on Brilinta and Coumadin with the intent to transition to Plavix in 3 months.    Since I saw him in the office 1 year ago he continues to do well.  Unfortunately his wife passed away.  He now lives alone but is independent.  He continues on Coumadin for his prior pulmonary embolism followed by his PCP.  He is otherwise asymptomatic.   Current Meds  Medication Sig   amLODipine (NORVASC) 5 MG tablet TAKE ONE TABLET BY MOUTH ONCE DAILY (Patient taking differently: Take 5 mg by mouth daily.)   Ascorbic Acid (VITAMIN C) 1000 MG tablet Take 1,000 mg by mouth daily.   aspirin EC 81 MG tablet Take 1 tablet (81 mg total) by mouth daily. Swallow whole.   atorvastatin (LIPITOR) 80 MG tablet Take 1 tablet (80 mg total) by mouth daily at 6 PM. (Patient taking differently: Take 80 mg by mouth at bedtime.)   cholecalciferol (VITAMIN D3) 25 MCG (1000 UNIT) tablet Take 1,000 Units by mouth daily.   diazepam (VALIUM) 10 MG tablet Take 10 mg by mouth in the morning, at noon, and at bedtime.   dicyclomine (BENTYL) 10 MG capsule TAKE 1 CAPSULE BY MOUTH 3 TIMES DAILY BEFORE MEALS.   fenofibrate (TRICOR) 145 MG tablet TAKE 1 TABLET EVERY DAY   levothyroxine (SYNTHROID) 50 MCG tablet Take 50 mcg by mouth daily before breakfast.   losartan (COZAAR) 25 MG tablet  Take 1 tablet (25 mg total) by mouth daily.   metFORMIN (GLUCOPHAGE) 500 MG tablet Take 500 mg by mouth 3 (three) times daily. Take 2 tablets in morning and 1 tablet at night   metoprolol succinate (TOPROL-XL) 25 MG 24 hr tablet Take 25 mg by mouth daily.   Multiple Vitamin (MULTIVITAMIN WITH MINERALS) TABS tablet Take 1 tablet by mouth daily.   nitroGLYCERIN (NITROSTAT) 0.4 MG SL tablet Place 1 tablet (0.4 mg total) under the tongue every 5 (five) minutes as needed.   Omega-3 Fatty Acids (FISH OIL) 1200 MG CAPS Take 1,200 mg by mouth daily.   omeprazole (PRILOSEC) 40 MG capsule TAKE 1 CAPSULE (40 MG TOTAL) BY MOUTH DAILY.   warfarin (COUMADIN)  4 MG tablet Take 4 mg by mouth daily.     Allergies  Allergen Reactions   Codeine Hives, Itching and Nausea Only   Niaspan [Niacin Er] Other (See Comments)    MAKES SKIN FLUSH EXCESSIVELY    Social History   Socioeconomic History   Marital status: Married    Spouse name: Not on file   Number of children: 2   Years of education: Not on file   Highest education level: Not on file  Occupational History   Occupation: disablity    Comment: CAD  Tobacco Use   Smoking status: Former    Current packs/day: 0.00    Average packs/day: 0.1 packs/day for 13.0 years (1.3 ttl pk-yrs)    Types: Cigarettes    Start date: 05/27/1964    Quit date: 05/27/1977    Years since quitting: 45.6   Smokeless tobacco: Former    Quit date: 09/08/1968  Substance and Sexual Activity   Alcohol use: No   Drug use: No   Sexual activity: Yes    Partners: Female    Birth control/protection: None  Other Topics Concern   Not on file  Social History Narrative   Not on file   Social Determinants of Health   Financial Resource Strain: Not on file  Food Insecurity: Not on file  Transportation Needs: Not on file  Physical Activity: Not on file  Stress: Not on file  Social Connections: Not on file  Intimate Partner Violence: Not on file     Review of Systems: General: negative for chills, fever, night sweats or weight changes.  Cardiovascular: negative for chest pain, dyspnea on exertion, edema, orthopnea, palpitations, paroxysmal nocturnal dyspnea or shortness of breath Dermatological: negative for rash Respiratory: negative for cough or wheezing Urologic: negative for hematuria Abdominal: negative for nausea, vomiting, diarrhea, bright red blood per rectum, melena, or hematemesis Neurologic: negative for visual changes, syncope, or dizziness All other systems reviewed and are otherwise negative except as noted above.    Blood pressure 112/62, pulse 81, height 6' (1.829 m), weight 226 lb 6.4 oz  (102.7 kg), SpO2 95%.  General appearance: alert and no distress Neck: no adenopathy, no JVD, supple, symmetrical, trachea midline, thyroid not enlarged, symmetric, no tenderness/mass/nodules, and soft right carotid bruit Lungs: clear to auscultation bilaterally Heart: Regular rate and rhythm without murmurs gallops rubs or clicks. Extremities: extremities normal, atraumatic, no cyanosis or edema Pulses: 2+ and symmetric Skin: Skin color, texture, turgor normal. No rashes or lesions Neurologic: Grossly normal  EKG EKG Interpretation Date/Time:  Tuesday January 02 2023 11:12:34 EDT Ventricular Rate:  81 PR Interval:  214 QRS Duration:  104 QT Interval:  388 QTC Calculation: 450 R Axis:   -30  Text Interpretation: Sinus rhythm with 1st  degree A-V block Left axis deviation Minimal voltage criteria for LVH, may be normal variant ( R in aVL ) When compared with ECG of 29-Nov-2020 10:25, Premature atrial complexes are no longer Present Confirmed by Nanetta Batty [phone]) on 01/02/2023 11:20:08 AM    ASSESSMENT AND PLAN:   CAD-s/p PCI History of CAD post inferior STEMI February 2007.  I performed PCI and stenting of his RCA at that time.  He is restudied June 2008 and found to have patent stents in the proximal RCA noncritical disease with proximal LAD with normal LV function.  Dr. Clifton James starting him in the setting of chest pain 08/29/2017 revealing patent proximal LAD stent, 99% distal RCA stenosis at the genu with essentially normal left system.  He underwent PCI stenting using a 2.75 mm x 20 mm long Synergy drug-eluting stent postdilated to 3 mm.  He has remained asymptomatic.  Dyslipidemia History of dyslipidemia on high-dose atorvastatin and fibrate with lipid profile performed 11/28/2022 revealing total cholesterol 118, LDL 44 and HDL 38.  Chronic anticoagulation for PE, DVT History of DVT/pulmonary embolism on Coumadin anticoagulation.  Essential hypertension History of essential  hypertension blood pressure measured today at 112/62.  He is on amlodipine, losartan and metoprolol.     Runell Gess MD FACP,FACC,FAHA, [facility] 01/02/2023 11:27 AM

## 2023-01-16 ENCOUNTER — Ambulatory Visit (HOSPITAL_COMMUNITY)
Admission: RE | Admit: 2023-01-16 | Discharge: 2023-01-16 | Disposition: A | Discharge status: Home / Self Care | Payer: Medicare HMO | Source: Ambulatory Visit | Attending: Cardiology | Admitting: Cardiology

## 2023-01-16 DIAGNOSIS — E785 Hyperlipidemia, unspecified: Secondary | ICD-10-CM | POA: Diagnosis not present

## 2023-01-16 DIAGNOSIS — Z9861 Coronary angioplasty status: Secondary | ICD-10-CM | POA: Insufficient documentation

## 2023-01-16 DIAGNOSIS — I2510 Atherosclerotic heart disease of native coronary artery without angina pectoris: Secondary | ICD-10-CM | POA: Insufficient documentation

## 2023-01-16 DIAGNOSIS — R0989 Other specified symptoms and signs involving the circulatory and respiratory systems: Secondary | ICD-10-CM | POA: Insufficient documentation

## 2023-01-18 ENCOUNTER — Encounter: Payer: Self-pay | Admitting: Cardiovascular Disease

## 2023-01-26 ENCOUNTER — Other Ambulatory Visit: Payer: Self-pay | Admitting: Cardiovascular Disease

## 2023-02-06 DIAGNOSIS — Z79899 Other long term (current) drug therapy: Secondary | ICD-10-CM | POA: Diagnosis not present

## 2023-02-28 DIAGNOSIS — I10 Essential (primary) hypertension: Secondary | ICD-10-CM | POA: Diagnosis not present

## 2023-02-28 DIAGNOSIS — Z6831 Body mass index (BMI) 31.0-31.9, adult: Secondary | ICD-10-CM | POA: Diagnosis not present

## 2023-02-28 DIAGNOSIS — N529 Male erectile dysfunction, unspecified: Secondary | ICD-10-CM | POA: Diagnosis not present

## 2023-02-28 DIAGNOSIS — E1140 Type 2 diabetes mellitus with diabetic neuropathy, unspecified: Secondary | ICD-10-CM | POA: Diagnosis not present

## 2023-02-28 DIAGNOSIS — E1165 Type 2 diabetes mellitus with hyperglycemia: Secondary | ICD-10-CM | POA: Diagnosis not present

## 2023-02-28 DIAGNOSIS — E6609 Other obesity due to excess calories: Secondary | ICD-10-CM | POA: Diagnosis not present

## 2023-02-28 DIAGNOSIS — I700 Atherosclerosis of aorta: Secondary | ICD-10-CM | POA: Diagnosis not present

## 2023-02-28 DIAGNOSIS — Z23 Encounter for immunization: Secondary | ICD-10-CM | POA: Diagnosis not present

## 2023-03-02 DIAGNOSIS — E291 Testicular hypofunction: Secondary | ICD-10-CM | POA: Diagnosis not present

## 2023-04-02 DIAGNOSIS — E291 Testicular hypofunction: Secondary | ICD-10-CM | POA: Diagnosis not present

## 2023-04-02 DIAGNOSIS — Z79899 Other long term (current) drug therapy: Secondary | ICD-10-CM | POA: Diagnosis not present

## 2023-04-09 DIAGNOSIS — Z79899 Other long term (current) drug therapy: Secondary | ICD-10-CM | POA: Diagnosis not present

## 2023-04-16 DIAGNOSIS — Z79899 Other long term (current) drug therapy: Secondary | ICD-10-CM | POA: Diagnosis not present

## 2023-04-23 DIAGNOSIS — Z79899 Other long term (current) drug therapy: Secondary | ICD-10-CM | POA: Diagnosis not present

## 2023-04-30 DIAGNOSIS — Z79899 Other long term (current) drug therapy: Secondary | ICD-10-CM | POA: Diagnosis not present

## 2023-05-03 DIAGNOSIS — E291 Testicular hypofunction: Secondary | ICD-10-CM | POA: Diagnosis not present

## 2023-06-12 DIAGNOSIS — E291 Testicular hypofunction: Secondary | ICD-10-CM | POA: Diagnosis not present

## 2023-06-23 DIAGNOSIS — E669 Obesity, unspecified: Secondary | ICD-10-CM | POA: Diagnosis not present

## 2023-06-23 DIAGNOSIS — R030 Elevated blood-pressure reading, without diagnosis of hypertension: Secondary | ICD-10-CM | POA: Diagnosis not present

## 2023-06-23 DIAGNOSIS — E119 Type 2 diabetes mellitus without complications: Secondary | ICD-10-CM | POA: Diagnosis not present

## 2023-06-23 DIAGNOSIS — R42 Dizziness and giddiness: Secondary | ICD-10-CM | POA: Diagnosis not present

## 2023-06-23 DIAGNOSIS — Z6832 Body mass index (BMI) 32.0-32.9, adult: Secondary | ICD-10-CM | POA: Diagnosis not present

## 2023-06-26 DIAGNOSIS — Z6831 Body mass index (BMI) 31.0-31.9, adult: Secondary | ICD-10-CM | POA: Diagnosis not present

## 2023-06-26 DIAGNOSIS — H8110 Benign paroxysmal vertigo, unspecified ear: Secondary | ICD-10-CM | POA: Diagnosis not present

## 2023-06-26 DIAGNOSIS — E6609 Other obesity due to excess calories: Secondary | ICD-10-CM | POA: Diagnosis not present

## 2023-08-06 DIAGNOSIS — Z6831 Body mass index (BMI) 31.0-31.9, adult: Secondary | ICD-10-CM | POA: Diagnosis not present

## 2023-08-06 DIAGNOSIS — I700 Atherosclerosis of aorta: Secondary | ICD-10-CM | POA: Diagnosis not present

## 2023-08-06 DIAGNOSIS — E291 Testicular hypofunction: Secondary | ICD-10-CM | POA: Diagnosis not present

## 2023-08-06 DIAGNOSIS — E6609 Other obesity due to excess calories: Secondary | ICD-10-CM | POA: Diagnosis not present

## 2023-08-06 DIAGNOSIS — H8110 Benign paroxysmal vertigo, unspecified ear: Secondary | ICD-10-CM | POA: Diagnosis not present

## 2023-08-06 DIAGNOSIS — E1140 Type 2 diabetes mellitus with diabetic neuropathy, unspecified: Secondary | ICD-10-CM | POA: Diagnosis not present

## 2023-08-06 DIAGNOSIS — I10 Essential (primary) hypertension: Secondary | ICD-10-CM | POA: Diagnosis not present

## 2023-08-06 DIAGNOSIS — E1165 Type 2 diabetes mellitus with hyperglycemia: Secondary | ICD-10-CM | POA: Diagnosis not present

## 2023-08-10 DIAGNOSIS — E291 Testicular hypofunction: Secondary | ICD-10-CM | POA: Diagnosis not present

## 2023-08-30 ENCOUNTER — Other Ambulatory Visit (HOSPITAL_COMMUNITY): Payer: Self-pay | Admitting: Internal Medicine

## 2023-08-30 DIAGNOSIS — R609 Edema, unspecified: Secondary | ICD-10-CM | POA: Diagnosis not present

## 2023-08-30 DIAGNOSIS — E559 Vitamin D deficiency, unspecified: Secondary | ICD-10-CM | POA: Diagnosis not present

## 2023-08-30 DIAGNOSIS — E1140 Type 2 diabetes mellitus with diabetic neuropathy, unspecified: Secondary | ICD-10-CM | POA: Diagnosis not present

## 2023-08-30 DIAGNOSIS — I872 Venous insufficiency (chronic) (peripheral): Secondary | ICD-10-CM | POA: Diagnosis not present

## 2023-08-30 DIAGNOSIS — E1165 Type 2 diabetes mellitus with hyperglycemia: Secondary | ICD-10-CM | POA: Diagnosis not present

## 2023-08-30 DIAGNOSIS — H8110 Benign paroxysmal vertigo, unspecified ear: Secondary | ICD-10-CM

## 2023-08-30 DIAGNOSIS — Z0001 Encounter for general adult medical examination with abnormal findings: Secondary | ICD-10-CM | POA: Diagnosis not present

## 2023-08-30 DIAGNOSIS — Z9229 Personal history of other drug therapy: Secondary | ICD-10-CM | POA: Diagnosis not present

## 2023-08-30 DIAGNOSIS — I10 Essential (primary) hypertension: Secondary | ICD-10-CM | POA: Diagnosis not present

## 2023-09-09 ENCOUNTER — Ambulatory Visit (HOSPITAL_COMMUNITY)
Admission: RE | Admit: 2023-09-09 | Discharge: 2023-09-09 | Disposition: A | Discharge status: Home / Self Care | Source: Ambulatory Visit | Attending: Internal Medicine | Admitting: Internal Medicine

## 2023-09-09 DIAGNOSIS — H8110 Benign paroxysmal vertigo, unspecified ear: Secondary | ICD-10-CM | POA: Diagnosis not present

## 2023-09-09 DIAGNOSIS — R42 Dizziness and giddiness: Secondary | ICD-10-CM | POA: Diagnosis not present

## 2023-09-09 DIAGNOSIS — J329 Chronic sinusitis, unspecified: Secondary | ICD-10-CM | POA: Diagnosis not present

## 2023-09-17 DIAGNOSIS — E1140 Type 2 diabetes mellitus with diabetic neuropathy, unspecified: Secondary | ICD-10-CM | POA: Diagnosis not present

## 2023-09-17 DIAGNOSIS — Z79899 Other long term (current) drug therapy: Secondary | ICD-10-CM | POA: Diagnosis not present

## 2023-10-22 DIAGNOSIS — Z79899 Other long term (current) drug therapy: Secondary | ICD-10-CM | POA: Diagnosis not present

## 2023-11-15 DIAGNOSIS — E119 Type 2 diabetes mellitus without complications: Secondary | ICD-10-CM | POA: Diagnosis not present

## 2023-11-26 DIAGNOSIS — Z79899 Other long term (current) drug therapy: Secondary | ICD-10-CM | POA: Diagnosis not present

## 2023-11-27 ENCOUNTER — Other Ambulatory Visit: Payer: Self-pay | Admitting: Internal Medicine

## 2023-11-27 DIAGNOSIS — K219 Gastro-esophageal reflux disease without esophagitis: Secondary | ICD-10-CM

## 2024-03-31 ENCOUNTER — Other Ambulatory Visit: Payer: Self-pay

## 2024-03-31 ENCOUNTER — Emergency Department (HOSPITAL_COMMUNITY)

## 2024-03-31 ENCOUNTER — Emergency Department (HOSPITAL_COMMUNITY)
Admission: EM | Admit: 2024-03-31 | Discharge: 2024-03-31 | Disposition: A | Discharge status: Home / Self Care | Attending: Emergency Medicine | Admitting: Emergency Medicine

## 2024-03-31 ENCOUNTER — Encounter (HOSPITAL_COMMUNITY): Payer: Self-pay

## 2024-03-31 DIAGNOSIS — Z7982 Long term (current) use of aspirin: Secondary | ICD-10-CM | POA: Insufficient documentation

## 2024-03-31 DIAGNOSIS — R109 Unspecified abdominal pain: Secondary | ICD-10-CM | POA: Insufficient documentation

## 2024-03-31 DIAGNOSIS — Z7901 Long term (current) use of anticoagulants: Secondary | ICD-10-CM | POA: Diagnosis not present

## 2024-03-31 DIAGNOSIS — R195 Other fecal abnormalities: Secondary | ICD-10-CM

## 2024-03-31 DIAGNOSIS — R197 Diarrhea, unspecified: Secondary | ICD-10-CM | POA: Diagnosis present

## 2024-03-31 LAB — COMPREHENSIVE METABOLIC PANEL WITH GFR
ALT: 20 U/L (ref 0–44)
AST: 21 U/L (ref 15–41)
Albumin: 4.4 g/dL (ref 3.5–5.0)
Alkaline Phosphatase: 25 U/L — ABNORMAL LOW (ref 38–126)
Anion gap: 14 (ref 5–15)
BUN: 12 mg/dL (ref 8–23)
CO2: 23 mmol/L (ref 22–32)
Calcium: 9.4 mg/dL (ref 8.9–10.3)
Chloride: 100 mmol/L (ref 98–111)
Creatinine, Ser: 0.91 mg/dL (ref 0.61–1.24)
GFR, Estimated: >60 mL/min (ref >60)
Glucose, Bld: 255 mg/dL — ABNORMAL HIGH (ref 70–99)
Potassium: 3.6 mmol/L (ref 3.5–5.1)
Sodium: 137 mmol/L (ref 135–145)
Total Bilirubin: 0.3 mg/dL (ref 0.0–1.2)
Total Protein: 6.8 g/dL (ref 6.5–8.1)

## 2024-03-31 LAB — CBC
HCT: 40 % (ref 39.0–52.0)
Hemoglobin: 13.6 g/dL (ref 13.0–17.0)
MCH: 29.4 pg (ref 26.0–34.0)
MCHC: 34 g/dL (ref 30.0–36.0)
MCV: 86.4 fL (ref 80.0–100.0)
Platelets: 279 K/uL (ref 150–400)
RBC: 4.63 MIL/uL (ref 4.22–5.81)
RDW: 13.3 % (ref 11.5–15.5)
WBC: 9.6 K/uL (ref 4.0–10.5)
nRBC: 0 % (ref 0.0–0.2)

## 2024-03-31 LAB — URINALYSIS, ROUTINE W REFLEX MICROSCOPIC
Bacteria, UA: NONE SEEN
Bilirubin Urine: NEGATIVE
Glucose, UA: >=500 mg/dL — AB
Hgb urine dipstick: NEGATIVE
Ketones, ur: NEGATIVE mg/dL
Leukocytes,Ua: NEGATIVE
Nitrite: NEGATIVE
Protein, ur: NEGATIVE mg/dL
Specific Gravity, Urine: 1.021 (ref 1.005–1.030)
pH: 5 (ref 5.0–8.0)

## 2024-03-31 LAB — LIPASE, BLOOD: Lipase: 57 U/L — ABNORMAL HIGH (ref 11–51)

## 2024-03-31 MED ORDER — AMOXICILLIN-POT CLAVULANATE 875-125 MG PO TABS
1.0000 | ORAL_TABLET | Freq: Once | ORAL | Status: AC
  Administered 2024-03-31: 1 via ORAL
  Filled 2024-03-31: qty 1

## 2024-03-31 MED ORDER — LOPERAMIDE HCL 2 MG PO CAPS: 2.0000 mg | ORAL_CAPSULE | Freq: Four times a day (QID) | ORAL | 0 refills | Status: AC | PRN

## 2024-03-31 NOTE — ED Triage Notes (Signed)
 Pt reports he was advised to come to the ED for treatment and evaluation due to possible C-Diff and have blood work and fluids.

## 2024-03-31 NOTE — ED Provider Notes (Signed)
 [place] EMERGENCY DEPARTMENT AT [facility] Provider Note  CSN: [id number] Arrival date & time: 03/31/24  1620     Patient presents with: Diarrhea   Steven Oneal is a 78 y.o. male.   HPI Patient presents concerned of loose stool. Patient states that since an episode of the shingles in October of this year, he has had loose stool with all bowel movements.  Initially denies any abdominal pain.  He does state that he had abdominal pain earlier today, after a rapid transit like episode of loose stool.  Currently no complaints at all and in general he has no nausea, anorexia, fever.  Today after speaking with practitioners at his clinic he was sent here for evaluation for concern of frequent loose stool. Patient notes no recent colonoscopy as his gastroenterologist informed that he is beyond the age of this test.    Prior to Admission medications   Medication Sig Start Date End Date Taking? Authorizing Provider  loperamide (IMODIUM) 2 MG capsule Take 1 capsule (2 mg total) by mouth 4 (four) times daily as needed for diarrhea or loose stools. 03/31/24  Yes Garrick Charleston, MD  amLODipine  (NORVASC ) 5 MG tablet TAKE ONE TABLET BY MOUTH ONCE DAILY Patient taking differently: Take 5 mg by mouth daily. 01/03/17   Court Dorn PARAS, MD  Ascorbic Acid (VITAMIN C) 1000 MG tablet Take 1,000 mg by mouth daily.    [provider]  aspirin  EC 81 MG tablet Take 1 tablet (81 mg total) by mouth daily. Swallow whole. 01/10/22   Court Dorn PARAS, MD  atorvastatin  (LIPITOR ) 80 MG tablet Take 1 tablet (80 mg total) by mouth daily at 6 PM. Patient taking differently: Take 80 mg by mouth at bedtime. 08/31/17   Henry Manuelita NOVAK, NP  cholecalciferol (VITAMIN D3) 25 MCG (1000 UNIT) tablet Take 1,000 Units by mouth daily.    [provider]  diazepam  (VALIUM ) 10 MG tablet Take 10 mg by mouth in the morning, at noon, and at bedtime.    [provider]  dicyclomine  (BENTYL )  10 MG capsule TAKE 1 CAPSULE BY MOUTH 3 TIMES DAILY BEFORE MEALS. 12/09/21   Shaaron Charleston HERO, MD  fenofibrate  (TRICOR ) 145 MG tablet TAKE 1 TABLET EVERY DAY 01/26/23   Berry, Jonathan J, MD  levothyroxine (SYNTHROID) 50 MCG tablet Take 50 mcg by mouth daily before breakfast. 08/18/20   [provider]  losartan  (COZAAR ) 25 MG tablet Take 1 tablet (25 mg total) by mouth daily. 02/16/21   Kroeger, Krista M., PA-C  meclizine (ANTIVERT) 25 MG tablet Take 25 mg by mouth as needed for dizziness (FOR INNER EAR).  Patient not taking: Reported on 01/02/2023 05/13/13   [provider]  metFORMIN (GLUCOPHAGE) 500 MG tablet Take 500 mg by mouth 3 (three) times daily. Take 2 tablets in morning and 1 tablet at night 12/05/22   [provider]  metoprolol  succinate (TOPROL -XL) 25 MG 24 hr tablet Take 25 mg by mouth daily. 11/08/20   [provider]  Multiple Vitamin (MULTIVITAMIN WITH MINERALS) TABS tablet Take 1 tablet by mouth daily.    [provider]  nitroGLYCERIN  (NITROSTAT ) 0.4 MG SL tablet Place 1 tablet (0.4 mg total) under the tongue every 5 (five) minutes as needed. 08/31/17   Henry Manuelita NOVAK, NP  Omega-3 Fatty Acids  (FISH OIL) 1200 MG CAPS Take 1,200 mg by mouth daily.    [provider]  omeprazole  (PRILOSEC) 40 MG capsule TAKE 1 CAPSULE (40  MG TOTAL) BY MOUTH DAILY. 09/07/22   Rourk, Lamar HERO, MD  warfarin (COUMADIN ) 4 MG tablet Take 4 mg by mouth daily. 11/08/22   [provider]    Allergies: Codeine and Niaspan  [niacin  er (antihyperlipidemic)]    Review of Systems  Updated Vital Signs BP (!) 147/66 (BP Location: Right Arm)   Pulse 92   Temp 98.5 F (36.9 C) (Oral)   Resp 17   Ht 1.829 m (6')   Wt 102.7 kg   SpO2 94%   BMI 30.71 kg/m   Physical Exam Vitals and nursing note reviewed.  Constitutional:      General: He is not in acute distress.    Appearance: He is well-developed.  HENT:     Head: Normocephalic and atraumatic.   Eyes:     Conjunctiva/sclera: Conjunctivae normal.  Cardiovascular:     Rate and Rhythm: Normal rate and regular rhythm.  Pulmonary:     Effort: Pulmonary effort is normal. No respiratory distress.     Breath sounds: No stridor.  Abdominal:     General: There is no distension.     Tenderness: There is no abdominal tenderness. There is guarding.  Skin:    General: Skin is warm and dry.  Neurological:     Mental Status: He is alert and oriented to person, place, and time.     (all labs ordered are listed, but only abnormal results are displayed) Labs Reviewed  LIPASE, BLOOD - Abnormal; Notable for the following components:      Result Value   Lipase 57 (*)    All other components within normal limits  COMPREHENSIVE METABOLIC PANEL WITH GFR - Abnormal; Notable for the following components:   Glucose, Bld 255 (*)    Alkaline Phosphatase 25 (*)    All other components within normal limits  URINALYSIS, ROUTINE W REFLEX MICROSCOPIC - Abnormal; Notable for the following components:   Glucose, UA >=500 (*)    All other components within normal limits  CBC    EKG: None  Radiology: CT ABDOMEN PELVIS WO CONTRAST Result Date: 03/31/2024 CLINICAL DATA:  Provided history: Diverticulitis, complication suspected Patient reports frequent diarrhea and abnormal lab work. EXAM: CT ABDOMEN AND PELVIS WITHOUT CONTRAST TECHNIQUE: Multidetector CT imaging of the abdomen and pelvis was performed following the standard protocol without IV contrast. RADIATION DOSE REDUCTION: This exam was performed according to the departmental dose-optimization program which includes automated exposure control, adjustment of the mA and/or kV according to patient size and/or use of iterative reconstruction technique. COMPARISON:  CT 03/26/2018 FINDINGS: Lower chest: Subsegmental atelectasis in the right lower lobe. Hepatobiliary: Diffuse hepatic steatosis. The liver is enlarged spanning 19.3 cm cranial caudal. No  evidence of focal liver abnormality on this unenhanced exam. No biliary dilatation post cholecystectomy. Pancreas: No ductal dilatation or inflammation. Spleen: Normal in size without focal abnormality. Adrenals/Urinary Tract: Small low-density right adrenal nodule needs no further imaging follow-up. The left adrenal gland is normal. Mild bilateral renal parenchymal thinning. Chronic bilateral perinephric stranding, chronic. No hydronephrosis. No renal calculi. No suspicious renal lesion. No urolithiasis. No bladder wall thickening. Stomach/Bowel: Unremarkable appearance of the stomach. No bowel obstruction or inflammatory change. Diffuse colonic diverticulosis without diverticulitis. Formed stool in the colon. No colonic wall thickening. The appendix is absent. Vascular/Lymphatic: Aortic atherosclerosis. No aortic aneurysm. Periportal nodes are stable from prior exam, typically reactive. Reproductive: Prostate is unremarkable. Other: No free air, free fluid, or intra-abdominal fluid collection. Small fat containing umbilical hernia. Minimal fat  in the inguinal canals. Musculoskeletal: Multilevel degenerative change in the spine. There are no acute or suspicious osseous abnormalities. IMPRESSION: 1. No acute abnormality in the abdomen/pelvis. 2. Colonic diverticulosis without diverticulitis. 3. Hepatomegaly and hepatic steatosis. Aortic Atherosclerosis (ICD10-I70.0). Electronically Signed   By: Andrea Gasman M.D.   On: 03/31/2024 22:15     Procedures   Medications Ordered in the ED  amoxicillin-clavulanate (AUGMENTIN) 875-125 MG per tablet 1 tablet (has no administration in time range)                                    Medical Decision Making Elderly male presents with loose stool with all bowel movements, now for almost 2 months. Patient is awake, alert, afebrile, hemodynamically unremarkable, has no current complaints including no abdominal pain.  Broad differential including effects from his  episode of shingles, diverticulitis given change in bowel movements, patient has no fever, systemic signs suggesting with reassuring labs aside from trace abnormal lipase value, patient will have CT    Amount and/or Complexity of Data Reviewed External Data Reviewed: notes. Labs: ordered. Decision-making details documented in ED Course. Radiology: ordered and independent interpretation performed. Decision-making details documented in ED Course.  Risk Prescription drug management. Decision regarding hospitalization.   Update: Patient in no distress.  Awake, alert we reviewed CT imaging negative for acute findings, patient has had abdominal pain, with change in bowel movements, some consideration for clinical diverticulitis.  Patient will follow-up with gastroenterology.     Final diagnoses:  Loose stools    ED Discharge Orders          Ordered    loperamide (IMODIUM) 2 MG capsule  4 times daily PRN        03/31/24 2248               Garrick Charleston, MD 03/31/24 2248

## 2024-03-31 NOTE — Discharge Instructions (Addendum)
 As discussed, today's evaluation has been generally reassuring.  Your loose stools, and abdominal pain may be due to diverticulitis.  Follow-up with your physician or our gastroenterology colleagues. Return here for concerning changes in your condition.

## 2024-03-31 NOTE — ED Triage Notes (Signed)
 Pt arrived via POV c/o abnormal lab work. Pt reports having labs drawn recently due to having frequent diarrhea. Pt denies abdominal pain.

## 2024-04-02 ENCOUNTER — Encounter: Payer: Self-pay | Admitting: Gastroenterology

## 2024-05-27 ENCOUNTER — Ambulatory Visit: Admitting: Gastroenterology

## 2024-05-28 ENCOUNTER — Emergency Department (HOSPITAL_COMMUNITY)

## 2024-05-28 ENCOUNTER — Other Ambulatory Visit: Payer: Self-pay

## 2024-05-28 ENCOUNTER — Observation Stay (HOSPITAL_COMMUNITY)
Admission: EM | Admit: 2024-05-28 | Discharge: 2024-05-29 | Disposition: A | Discharge status: Home / Self Care | Attending: Internal Medicine | Admitting: Internal Medicine

## 2024-05-28 ENCOUNTER — Encounter (HOSPITAL_COMMUNITY): Payer: Self-pay

## 2024-05-28 DIAGNOSIS — S065XAA Traumatic subdural hemorrhage with loss of consciousness status unknown, initial encounter: Secondary | ICD-10-CM | POA: Diagnosis not present

## 2024-05-28 DIAGNOSIS — Z79899 Other long term (current) drug therapy: Secondary | ICD-10-CM | POA: Diagnosis not present

## 2024-05-28 DIAGNOSIS — Z7984 Long term (current) use of oral hypoglycemic drugs: Secondary | ICD-10-CM | POA: Diagnosis not present

## 2024-05-28 DIAGNOSIS — R531 Weakness: Secondary | ICD-10-CM | POA: Insufficient documentation

## 2024-05-28 DIAGNOSIS — I2510 Atherosclerotic heart disease of native coronary artery without angina pectoris: Secondary | ICD-10-CM | POA: Diagnosis not present

## 2024-05-28 DIAGNOSIS — Z86711 Personal history of pulmonary embolism: Secondary | ICD-10-CM | POA: Insufficient documentation

## 2024-05-28 DIAGNOSIS — S066X0A Traumatic subarachnoid hemorrhage without loss of consciousness, initial encounter: Secondary | ICD-10-CM | POA: Insufficient documentation

## 2024-05-28 DIAGNOSIS — R2689 Other abnormalities of gait and mobility: Secondary | ICD-10-CM | POA: Insufficient documentation

## 2024-05-28 DIAGNOSIS — Z87891 Personal history of nicotine dependence: Secondary | ICD-10-CM | POA: Insufficient documentation

## 2024-05-28 DIAGNOSIS — Z86718 Personal history of other venous thrombosis and embolism: Secondary | ICD-10-CM | POA: Insufficient documentation

## 2024-05-28 DIAGNOSIS — E66811 Obesity, class 1: Secondary | ICD-10-CM | POA: Insufficient documentation

## 2024-05-28 DIAGNOSIS — I10 Essential (primary) hypertension: Secondary | ICD-10-CM | POA: Diagnosis not present

## 2024-05-28 DIAGNOSIS — W19XXXA Unspecified fall, initial encounter: Secondary | ICD-10-CM

## 2024-05-28 DIAGNOSIS — E1165 Type 2 diabetes mellitus with hyperglycemia: Secondary | ICD-10-CM | POA: Diagnosis not present

## 2024-05-28 DIAGNOSIS — Z6831 Body mass index (BMI) 31.0-31.9, adult: Secondary | ICD-10-CM | POA: Insufficient documentation

## 2024-05-28 DIAGNOSIS — Z7982 Long term (current) use of aspirin: Secondary | ICD-10-CM | POA: Diagnosis not present

## 2024-05-28 DIAGNOSIS — S0990XA Unspecified injury of head, initial encounter: Secondary | ICD-10-CM | POA: Diagnosis present

## 2024-05-28 DIAGNOSIS — Z9181 History of falling: Secondary | ICD-10-CM | POA: Insufficient documentation

## 2024-05-28 DIAGNOSIS — M6281 Muscle weakness (generalized): Secondary | ICD-10-CM | POA: Diagnosis not present

## 2024-05-28 DIAGNOSIS — W009XXA Unspecified fall due to ice and snow, initial encounter: Secondary | ICD-10-CM | POA: Diagnosis not present

## 2024-05-28 DIAGNOSIS — E039 Hypothyroidism, unspecified: Secondary | ICD-10-CM | POA: Insufficient documentation

## 2024-05-28 DIAGNOSIS — R2681 Unsteadiness on feet: Secondary | ICD-10-CM | POA: Insufficient documentation

## 2024-05-28 DIAGNOSIS — Z955 Presence of coronary angioplasty implant and graft: Secondary | ICD-10-CM | POA: Insufficient documentation

## 2024-05-28 DIAGNOSIS — S065X0A Traumatic subdural hemorrhage without loss of consciousness, initial encounter: Secondary | ICD-10-CM | POA: Diagnosis not present

## 2024-05-28 DIAGNOSIS — D72829 Elevated white blood cell count, unspecified: Secondary | ICD-10-CM | POA: Diagnosis not present

## 2024-05-28 DIAGNOSIS — Z7901 Long term (current) use of anticoagulants: Secondary | ICD-10-CM | POA: Diagnosis not present

## 2024-05-28 LAB — CBC WITH DIFFERENTIAL/PLATELET
Abs Immature Granulocytes: 0.04 10*3/uL (ref 0.00–0.07)
Basophils Absolute: 0.1 10*3/uL (ref 0.0–0.1)
Basophils Relative: 1 %
Eosinophils Absolute: 0.1 10*3/uL (ref 0.0–0.5)
Eosinophils Relative: 1 %
HCT: 40.1 % (ref 39.0–52.0)
Hemoglobin: 13.5 g/dL (ref 13.0–17.0)
Immature Granulocytes: 0 %
Lymphocytes Relative: 14 %
Lymphs Abs: 1.7 10*3/uL (ref 0.7–4.0)
MCH: 29.7 pg (ref 26.0–34.0)
MCHC: 33.7 g/dL (ref 30.0–36.0)
MCV: 88.3 fL (ref 80.0–100.0)
Monocytes Absolute: 0.7 10*3/uL (ref 0.1–1.0)
Monocytes Relative: 6 %
Neutro Abs: 9.6 10*3/uL — ABNORMAL HIGH (ref 1.7–7.7)
Neutrophils Relative %: 78 %
Platelets: 250 10*3/uL (ref 150–400)
RBC: 4.54 MIL/uL (ref 4.22–5.81)
RDW: 13.5 % (ref 11.5–15.5)
WBC: 12.2 10*3/uL — ABNORMAL HIGH (ref 4.0–10.5)
nRBC: 0 % (ref 0.0–0.2)

## 2024-05-28 LAB — TYPE AND SCREEN
ABO/RH(D): A NEG
Antibody Screen: NEGATIVE

## 2024-05-28 LAB — PROTIME-INR
INR: 2.5 — ABNORMAL HIGH (ref 0.8–1.2)
Prothrombin Time: 28.6 s — ABNORMAL HIGH (ref 11.4–15.2)

## 2024-05-28 LAB — BASIC METABOLIC PANEL WITH GFR
Anion gap: 18 — ABNORMAL HIGH (ref 5–15)
BUN: 10 mg/dL (ref 8–23)
CO2: 21 mmol/L — ABNORMAL LOW (ref 22–32)
Calcium: 9.1 mg/dL (ref 8.9–10.3)
Chloride: 99 mmol/L (ref 98–111)
Creatinine, Ser: 0.69 mg/dL (ref 0.61–1.24)
GFR, Estimated: >60 mL/min
Glucose, Bld: 168 mg/dL — ABNORMAL HIGH (ref 70–99)
Potassium: 4.3 mmol/L (ref 3.5–5.1)
Sodium: 139 mmol/L (ref 135–145)

## 2024-05-28 MED ORDER — POLYETHYLENE GLYCOL 3350 17 G PO PACK: 17.0000 g | PACK | Freq: Every day | ORAL | Status: DC | PRN

## 2024-05-28 MED ORDER — AMLODIPINE BESYLATE 5 MG PO TABS
5.0000 mg | ORAL_TABLET | Freq: Every day | ORAL | Status: DC
  Administered 2024-05-29 (×2): 5 mg via ORAL
  Filled 2024-05-28 (×2): qty 1

## 2024-05-28 MED ORDER — MELATONIN 3 MG PO TABS: 6.0000 mg | ORAL_TABLET | Freq: Every evening | ORAL | Status: DC | PRN

## 2024-05-28 MED ORDER — PROCHLORPERAZINE EDISYLATE 10 MG/2ML IJ SOLN: 5.0000 mg | Freq: Four times a day (QID) | INTRAMUSCULAR | Status: DC | PRN

## 2024-05-28 MED ORDER — LEVOTHYROXINE SODIUM 50 MCG PO TABS
50.0000 ug | ORAL_TABLET | Freq: Every day | ORAL | Status: DC
  Administered 2024-05-29: 50 ug via ORAL
  Filled 2024-05-28: qty 1

## 2024-05-28 MED ORDER — ACETAMINOPHEN 325 MG PO TABS
650.0000 mg | ORAL_TABLET | Freq: Four times a day (QID) | ORAL | Status: DC | PRN
  Administered 2024-05-29: 650 mg via ORAL
  Filled 2024-05-28: qty 2

## 2024-05-28 NOTE — ED Notes (Signed)
The patient has returned from CT. 

## 2024-05-28 NOTE — ED Provider Notes (Signed)
 " [place] EMERGENCY DEPARTMENT AT [facility] Provider Note   CSN: [id number] Arrival date & time: 05/28/24  1739     Patient presents with: Steven Oneal is a 79 y.o. male.  He fell earlier today outside of his house when he slipped on the ice.  Not sure if he hit his head.  Family members noticed he seemed a little more confused.  Takes Coumadin .  Feels dizzy and has had some vomiting.  Last dose of Coumadin  was yesterday.  {Add pertinent medical, surgical, social history, OB history to YEP:67052} The history is provided by the patient and a relative.  Fall This is a new problem. The current episode started 3 to 5 hours ago. The problem has not changed since onset.Associated symptoms include headaches. Pertinent negatives include no chest pain, no abdominal pain and no shortness of breath. Nothing aggravates the symptoms. Nothing relieves the symptoms. He has tried nothing for the symptoms. The treatment provided no relief.       Prior to Admission medications  Medication Sig Start Date End Date Taking? Authorizing Provider  amLODipine  (NORVASC ) 5 MG tablet TAKE ONE TABLET BY MOUTH ONCE DAILY Patient taking differently: Take 5 mg by mouth daily. 01/03/17   Court Dorn PARAS, MD  Ascorbic Acid (VITAMIN C) 1000 MG tablet Take 1,000 mg by mouth daily.    [provider]  aspirin  EC 81 MG tablet Take 1 tablet (81 mg total) by mouth daily. Swallow whole. 01/10/22   Court Dorn PARAS, MD  atorvastatin  (LIPITOR ) 80 MG tablet Take 1 tablet (80 mg total) by mouth daily at 6 PM. Patient taking differently: Take 80 mg by mouth at bedtime. 08/31/17   Henry Manuelita NOVAK, NP  cholecalciferol (VITAMIN D3) 25 MCG (1000 UNIT) tablet Take 1,000 Units by mouth daily.    [provider]  diazepam  (VALIUM ) 10 MG tablet Take 10 mg by mouth in the morning, at noon, and at bedtime.    [provider]  dicyclomine  (BENTYL ) 10 MG capsule TAKE 1 CAPSULE BY MOUTH 3  TIMES DAILY BEFORE MEALS. 12/09/21   Shaaron Lamar HERO, MD  fenofibrate  (TRICOR ) 145 MG tablet TAKE 1 TABLET EVERY DAY 01/26/23   Court Dorn PARAS, MD  levothyroxine  (SYNTHROID ) 50 MCG tablet Take 50 mcg by mouth daily before breakfast. 08/18/20   [provider]  loperamide  (IMODIUM ) 2 MG capsule Take 1 capsule (2 mg total) by mouth 4 (four) times daily as needed for diarrhea or loose stools. 03/31/24   Garrick Lamar, MD  losartan  (COZAAR ) 25 MG tablet Take 1 tablet (25 mg total) by mouth daily. 02/16/21   Kroeger, Krista M., PA-C  meclizine (ANTIVERT) 25 MG tablet Take 25 mg by mouth as needed for dizziness (FOR INNER EAR).  Patient not taking: Reported on 01/02/2023 05/13/13   [provider]  metFORMIN (GLUCOPHAGE) 500 MG tablet Take 500 mg by mouth 3 (three) times daily. Take 2 tablets in morning and 1 tablet at night 12/05/22   [provider]  metoprolol  succinate (TOPROL -XL) 25 MG 24 hr tablet Take 25 mg by mouth daily. 11/08/20   [provider]  Multiple Vitamin (MULTIVITAMIN WITH MINERALS) TABS tablet Take 1 tablet by mouth daily.    [provider]  nitroGLYCERIN  (NITROSTAT ) 0.4 MG SL tablet Place 1 tablet (0.4 mg total) under the tongue every 5 (five) minutes as needed. 08/31/17   Henry Manuelita NOVAK, NP  Omega-3 Fatty Acids  (FISH OIL) 1200  MG CAPS Take 1,200 mg by mouth daily.    [provider]  omeprazole  (PRILOSEC) 40 MG capsule TAKE 1 CAPSULE (40 MG TOTAL) BY MOUTH DAILY. 09/07/22   Rourk, Lamar HERO, MD  warfarin (COUMADIN ) 4 MG tablet Take 4 mg by mouth daily. 11/08/22   [provider]    Allergies: Codeine and Niaspan  [niacin  er (antihyperlipidemic)]    Review of Systems  Constitutional:  Negative for fever.  Eyes:  Negative for visual disturbance.  Respiratory:  Negative for shortness of breath.   Cardiovascular:  Negative for chest pain.  Gastrointestinal:  Positive for nausea and vomiting. Negative for abdominal pain.   Musculoskeletal:  Positive for back pain.  Neurological:  Positive for dizziness and headaches.    Updated Vital Signs BP (!) 143/69 (BP Location: Right Arm)   Pulse (!) 102   Temp 98.2 F (36.8 C)   Resp 18   Wt 104.3 kg   SpO2 95%   BMI 31.19 kg/m   Physical Exam Vitals and nursing note reviewed.  Constitutional:      General: He is not in acute distress.    Appearance: Normal appearance. He is well-developed.  HENT:     Head: Normocephalic and atraumatic.  Eyes:     Conjunctiva/sclera: Conjunctivae normal.  Cardiovascular:     Rate and Rhythm: Normal rate and regular rhythm.     Heart sounds: No murmur heard. Pulmonary:     Effort: Pulmonary effort is normal. No respiratory distress.     Breath sounds: Normal breath sounds.  Abdominal:     Palpations: Abdomen is soft.     Tenderness: There is no abdominal tenderness. There is no guarding or rebound.  Musculoskeletal:        General: No deformity. Normal range of motion.     Cervical back: Neck supple.  Skin:    General: Skin is warm and dry.     Capillary Refill: Capillary refill takes less than 2 seconds.  Neurological:     General: No focal deficit present.     Mental Status: He is alert.     Cranial Nerves: No cranial nerve deficit.     Sensory: No sensory deficit.     Motor: No weakness.     (all labs ordered are listed, but only abnormal results are displayed) Labs Reviewed  BASIC METABOLIC PANEL WITH GFR - Abnormal; Notable for the following components:      Result Value   CO2 21 (*)    Glucose, Bld 168 (*)    Anion gap 18 (*)    All other components within normal limits  CBC WITH DIFFERENTIAL/PLATELET - Abnormal; Notable for the following components:   WBC 12.2 (*)    Neutro Abs 9.6 (*)    All other components within normal limits  PROTIME-INR - Abnormal; Notable for the following components:   Prothrombin Time 28.6 (*)    INR 2.5 (*)    All other components within normal limits  TYPE AND  SCREEN    EKG: None  Radiology: DG HIP UNILAT WITH PELVIS 2-3 VIEWS RIGHT Result Date: 05/28/2024 EXAM: 2 or 3 VIEW(S) XRAY OF THE PELVIS AND RIGHT HIP 05/28/2024 09:20:00 PM COMPARISON: None available. CLINICAL HISTORY: Fall. FINDINGS: BONES AND JOINTS: SI joints are symmetric. No acute fracture. Bilateral hips demonstrate normal alignment. SOFT TISSUES: Vascular calcifications. IMPRESSION: 1. No acute fracture or dislocation. Electronically signed by: Elsie Gravely MD 05/28/2024 09:30 PM EST RP Workstation: [id number]   DG HIP  UNILAT WITH PELVIS 2-3 VIEWS LEFT Result Date: 05/28/2024 EXAM: 2 OR 3 VIEW(S) XRAY OF THE UNILATERAL HIP 05/28/2024 09:20:00 PM COMPARISON: None available. CLINICAL HISTORY: Fall. FINDINGS: BONES AND JOINTS: No acute fracture. No malalignment. SOFT TISSUES: Partially seen penile prosthesis in place. Mild vascular calcifications. IMPRESSION: 1. No acute findings. Electronically signed by: Elsie Gravely MD 05/28/2024 09:30 PM EST RP Workstation: [id number]   DG Lumbar Spine Complete Result Date: 05/28/2024 EXAM: 4 OR MORE VIEW(S) XRAY OF THE LUMBAR SPINE 05/28/2024 09:20:00 PM COMPARISON: CT abdomen and pelvis 03/31/2024. CLINICAL HISTORY: Fall back pain. Fall and back pain. Slip and fall on ice. FINDINGS: LUMBAR SPINE: BONES: 5 lumbar type vertebral bodies. Vertebral body heights are maintained. Alignment is normal. Mild lumbar scoliosis convex towards the right. No anterior subluxation. Normal alignment of the facet joints. No vertebral compression deformities. No focal bone lesion or bone destruction. Bone cortex appears intact. The sacrum appears intact. DISCS AND DEGENERATIVE CHANGES: Diffuse degenerative changes with intervertebral disc space narrowing and endplate osteophyte formation throughout. SOFT TISSUES: Vascular calcifications are visualized. Surgical clips are present in the right upper quadrant. No acute abnormality. IMPRESSION: 1. No evidence of acute  traumatic injury. 2. Diffuse degenerative changes with intervertebral disc space narrowing and endplate osteophyte formation throughout. 3. Mild lumbar scoliosis convex towards the right. Electronically signed by: Elsie Gravely MD 05/28/2024 09:29 PM EST RP Workstation: [id number]   CT Head Wo Contrast Result Date: 05/28/2024 CLINICAL DATA:  Hit head on Coumadin  fell on ice EXAM: CT HEAD WITHOUT CONTRAST CT CERVICAL SPINE WITHOUT CONTRAST TECHNIQUE: Multidetector CT imaging of the head and cervical spine was performed following the standard protocol without intravenous contrast. Multiplanar CT image reconstructions of the cervical spine were also generated. RADIATION DOSE REDUCTION: This exam was performed according to the departmental dose-optimization program which includes automated exposure control, adjustment of the mA and/or kV according to patient size and/or use of iterative reconstruction technique. COMPARISON:  CT 09/09/2023 FINDINGS: CT HEAD FINDINGS Brain: No acute territorial infarction or intracranial mass is visualized. Positive 4 acute right convexity subdural hematoma, best seen on coronal series, measures 4 mm maximum thickness on series 7, image 51. Trace subdural hematoma along the inter hemispheric falx and the anterior falx, this measures about 2 mm maximum thickness. Trace focus of subarachnoid blood at the right parasagittal frontal lobe, series 3, image 27 and sagittal series 9, image 29. There is also trace focus of subdural blood along the left tentorium, series 3, image 17 and sagittal series 9, image 35. There is atrophy and mild chronic small vessel ischemic changes of the white matter. The ventricles are stable in size. Vascular: No hyperdense vessels.  Carotid vascular calcification Skull: No fracture Sinuses/Orbits: No acute finding. Other: None small left posterior scalp hematoma Traumatic Brain Injury Risk Stratification Skull Fracture: No - Low/mBIG 1 Subdural Hematoma (SDH):  4mm to <49mm - mBIG 2 Subarachnoid Hemorrhage [facility]): trace (up to 2 sulci) - Low/mBIG 1 Epidural Hematoma (EDH): No - Low/mBIG 1 Cerebral contusion, intra-axial, intraparenchymal Hemorrhage (IPH): No Intraventricular Hemorrhage (IVH): No - Low/mBIG 1 Midline Shift > 1mm or Edema/effacement of sulci/vents: No - Low/mBIG 1 ---------------------------------------------------- CT CERVICAL SPINE FINDINGS Alignment: Mild exaggeration of cervical lordosis. No subluxation. Facet alignment is within normal limits. Skull base and vertebrae: No acute fracture. No primary bone lesion or focal pathologic process. Probable accessory ossicle inferior to the anterior arch of C1. Soft tissues and spinal canal: No prevertebral fluid or swelling. No visible canal hematoma. Disc  levels: Mild multilevel degenerative osteophytes. No high-grade canal stenosis. Multilevel facet degenerative changes with foraminal narrowing, most evident at C3-C4 Upper chest: Negative. Other: None IMPRESSION: 1. Acute right convexity subdural hematoma measuring up to 4 mm maximum thickness. Trace subdural hematoma along the inter hemispheric and anterior falx and left tentorium. Trace focus of subarachnoid blood at the right parasagittal frontal lobe. No midline shift. No mass effect 2. Atrophy and chronic small vessel ischemic changes of the white matter. 3. No CT evidence for acute osseous abnormality of the cervical spine. Critical Value/emergent results were called by telephone at the time of interpretation on 05/28/2024 at 7:16 pm to provider [facility] , who verbally acknowledged these results. Electronically Signed   By: Luke Bun M.D.   On: 05/28/2024 19:16   CT Cervical Spine Wo Contrast Result Date: 05/28/2024 CLINICAL DATA:  Hit head on Coumadin  fell on ice EXAM: CT HEAD WITHOUT CONTRAST CT CERVICAL SPINE WITHOUT CONTRAST TECHNIQUE: Multidetector CT imaging of the head and cervical spine was performed following the standard protocol  without intravenous contrast. Multiplanar CT image reconstructions of the cervical spine were also generated. RADIATION DOSE REDUCTION: This exam was performed according to the departmental dose-optimization program which includes automated exposure control, adjustment of the mA and/or kV according to patient size and/or use of iterative reconstruction technique. COMPARISON:  CT 09/09/2023 FINDINGS: CT HEAD FINDINGS Brain: No acute territorial infarction or intracranial mass is visualized. Positive 4 acute right convexity subdural hematoma, best seen on coronal series, measures 4 mm maximum thickness on series 7, image 51. Trace subdural hematoma along the inter hemispheric falx and the anterior falx, this measures about 2 mm maximum thickness. Trace focus of subarachnoid blood at the right parasagittal frontal lobe, series 3, image 27 and sagittal series 9, image 29. There is also trace focus of subdural blood along the left tentorium, series 3, image 17 and sagittal series 9, image 35. There is atrophy and mild chronic small vessel ischemic changes of the white matter. The ventricles are stable in size. Vascular: No hyperdense vessels.  Carotid vascular calcification Skull: No fracture Sinuses/Orbits: No acute finding. Other: None small left posterior scalp hematoma Traumatic Brain Injury Risk Stratification Skull Fracture: No - Low/mBIG 1 Subdural Hematoma (SDH): 4mm to <61mm - mBIG 2 Subarachnoid Hemorrhage [facility]): trace (up to 2 sulci) - Low/mBIG 1 Epidural Hematoma (EDH): No - Low/mBIG 1 Cerebral contusion, intra-axial, intraparenchymal Hemorrhage (IPH): No Intraventricular Hemorrhage (IVH): No - Low/mBIG 1 Midline Shift > 1mm or Edema/effacement of sulci/vents: No - Low/mBIG 1 ---------------------------------------------------- CT CERVICAL SPINE FINDINGS Alignment: Mild exaggeration of cervical lordosis. No subluxation. Facet alignment is within normal limits. Skull base and vertebrae: No acute fracture. No  primary bone lesion or focal pathologic process. Probable accessory ossicle inferior to the anterior arch of C1. Soft tissues and spinal canal: No prevertebral fluid or swelling. No visible canal hematoma. Disc levels: Mild multilevel degenerative osteophytes. No high-grade canal stenosis. Multilevel facet degenerative changes with foraminal narrowing, most evident at C3-C4 Upper chest: Negative. Other: None IMPRESSION: 1. Acute right convexity subdural hematoma measuring up to 4 mm maximum thickness. Trace subdural hematoma along the inter hemispheric and anterior falx and left tentorium. Trace focus of subarachnoid blood at the right parasagittal frontal lobe. No midline shift. No mass effect 2. Atrophy and chronic small vessel ischemic changes of the white matter. 3. No CT evidence for acute osseous abnormality of the cervical spine. Critical Value/emergent results were called by telephone at the time  of interpretation on 05/28/2024 at 7:16 pm to provider [facility] , who verbally acknowledged these results. Electronically Signed   By: Luke Bun M.D.   On: 05/28/2024 19:16    {Document cardiac monitor, telemetry assessment procedure when appropriate:32947} Procedures   Medications Ordered in the ED - No data to display  Clinical Course as of 05/28/24 2150  Wed May 28, 2024  1920 Received a call from radiology that the patient has subdural and subarachnoid.  Updated patient and family.  Will place a call to neurosurgery.  Getting labs to check INR. [MB]  2017 discussed with PA Meyran covering Dr. Onetha.  She reviewed images and said they are fairly small.  Recommending medical admission to [facility] and repeat head CT in the morning.  Hold Coumadin  for 2 weeks.  Does not need reversal at this time. [MB]  2024 Is on Coumadin  for prior PE [MB]  2148 Discussed with Dr. Shona [facility] hospitalist who will evaluate for admission. [MB]    Clinical Course User Index [MB] Towana Ozell BROCKS, MD    {Click here for ABCD2, HEART and other calculators REFRESH Note before signing:1}                              Medical Decision Making Amount and/or Complexity of Data Reviewed Labs: ordered. Radiology: ordered.  Risk Decision regarding hospitalization.   This patient complains of ***; this involves an extensive number of treatment Options and is a complaint that carries with it a high risk of complications and morbidity. The differential includes ***  I ordered, reviewed and interpreted labs, which included *** I ordered medication *** and reviewed PMP when indicated. I ordered imaging studies which included *** and I independently    visualized and interpreted imaging which showed *** Additional history obtained from *** Previous records obtained and reviewed *** I consulted *** and discussed lab and imaging findings and discussed disposition.  Cardiac monitoring reviewed, *** Social determinants considered, *** Critical Interventions: ***  After the interventions stated above, I reevaluated the patient and found *** Admission and further testing considered, ***   {Document critical care time when appropriate  Document review of labs and clinical decision tools ie [id number], etc  Document your independent review of radiology images and any outside records  Document your discussion with family members, caretakers and with consultants  Document social determinants of health affecting pt's care  Document your decision making why or why not admission, treatments were needed:32947:::1}   Final diagnoses:  None    ED Discharge Orders     None        "

## 2024-05-28 NOTE — H&P (Incomplete)
 " History and Physical  Steven Oneal FMW:[id number] DOB: 06-24-1945 DOA: 05/28/2024  Referring physician: Dr. Towana, EDP. PCP: Nsumanganyi, Kalombo Cesar, NP  Outpatient Specialists: Cardiology. Patient coming from: Home.  Chief Complaint: Fall on ice.  HPI: ADVIK Oneal is a 79 y.o. male with medical history significant for PE/DVT on Coumadin , hypertension, hypothyroidism, type 2 diabetes, obesity, who presents to the ER after a fall on ice.  He hit his head and was very confused afterwards.  His neighbor saw him on the ground and assisted him back to his house.  The patient was in his usual state of health prior to this.  Denies feeling dizzy, having palpitations, or chest pain prior to the fall.  His granddaughter who is a nurse was called and made aware of his fall and had him call 911 due to being on blood thinners.  In the ER, the patient is back to his baseline mentation.  A noncontrast head CT revealed stable 4 mm right convexity subdural hematoma and trace subarachnoid blood at the right parietal sagittal frontal lobe.  Stable left posterior scalp hematoma.  Chronic microvascular ischemic changes.  EDP discussed the case with neurosurgery who recommended repeating noncontrast head CT.  No need to reverse Coumadin  at this time per on-call neurosurgery.  Hold Coumadin  for the next 2 weeks.  However, if repeat CT head shows worsening hematoma then reverse Coumadin  and reconsult neurosurgery.  Admitted by [facility], hospitalist service.  At the time of the visit, the patient is alert and oriented x 3.  Has no new complaints, pain is controlled.  His granddaughter is present at bedside.  The patient is interested in switching Coumadin  to Eliquis however he is worried about high co-pays.  TOC consulted to check co-pays.  ED Course: Temperature 98.3.  BP 133/59, pulse 84, respiratory 19, saturation 95% on room air.  Review of Systems: Review of systems as noted in the HPI. All other  systems reviewed and are negative.   Past Medical History:  Diagnosis Date   Allergic rhinitis    Anticoagulated on Coumadin     CAD (coronary artery disease) cardiologist-  Steven Oneal   DES Cypher x 2 mid & ostial RCA 2007; Repeat LHC 06/2014 angiographically minimal CAD with widely patent RCA stents EF 60-65% 08/29/17 STEMI with PCI/DES x1 to mRCA   Dyslipidemia    ED (erectile dysfunction)    vascular   Essential hypertension    First degree heart block    GERD (gastroesophageal reflux disease)    Hiatal hernia    History of adenomatous polyp of colon    tubular adenoma 08-10-2010   History of DVT (deep vein thrombosis) 11/08/2012   LLE   History of pulmonary embolus (PE) 11/08/2012   LLL   History of ST elevation myocardial infarction (STEMI) 06/15/2005   inferior wall stemi-- s/p  pci and des x2 to rca   Nocturia    PAC (premature atrial contraction)    S/P drug eluting coronary stent placement 06/15/2005   to mid and ostial RCA   Vertigo    Wears dentures    upper denture and lower partial   Past Surgical History:  Procedure Laterality Date   CARDIAC CATHETERIZATION  10/09/2005   Patent stents, medical therapy   CARDIAC CATHETERIZATION  10/22/2006   Nonobstructive disease, 60% LAD, patent RCA stents   CARDIOVASCULAR STRESS TEST  05-08-2013  Steven Oneal   Low risk nuclear perfusion study w/ basal inferior wal nontransmural infarction and  no evidence ischemia/   normal LV function and mild basal inferior wall hypokinesis,  ef 65%   CHOLECYSTECTOMY N/A 07/14/2013   Procedure: LAPAROSCOPIC CHOLECYSTECTOMY;  Surgeon: Steven DELENA Budge, MD;  Location: [facility] ORS;  Service: General;  Laterality: N/A;   COLONOSCOPY N/A 08/03/2015   Steven. Shaaron: diverticulosis in entire colon, one 4 mm tubular adenoma, 5 year surveillance   COLONOSCOPY WITH PROPOFOL  N/A 12/02/2020   Procedure: COLONOSCOPY WITH PROPOFOL ;  Surgeon: Steven Lamar HERO, MD;  Location: [facility] ENDO [facility];  Service: Endoscopy;  Laterality: N/A;   1:00PM   CORONARY ANGIOPLASTY WITH STENT PLACEMENT  06/15/2005  Steven Oneal   Cypher stent to mid RCA; cypher stent -ostial RCA, RESIDUAL disease LAD   CORONARY/GRAFT ACUTE MI REVASCULARIZATION N/A 08/29/2017   Procedure: Coronary/Graft Acute MI Revascularization;  Surgeon: Steven Lonni BIRCH, MD;  Location: [facility] INVASIVE CV LAB;  Service: Cardiovascular;  Laterality: N/A;   ESOPHAGOGASTRODUODENOSCOPY N/A 08/03/2015   Steven. Shaaron: small hiatal hernia, otherwise normal   LAPAROSCOPIC APPENDECTOMY  03/10/2008   LEFT HEART CATH AND CORONARY ANGIOGRAPHY N/A 08/29/2017   Procedure: LEFT HEART CATH AND CORONARY ANGIOGRAPHY;  Surgeon: Steven Lonni BIRCH, MD;  Location: [facility] INVASIVE CV LAB;  Service: Cardiovascular;  Laterality: N/A;   LEFT HEART CATHETERIZATION WITH CORONARY ANGIOGRAM N/A 06/12/2014   Procedure: LEFT HEART CATHETERIZATION WITH CORONARY ANGIOGRAM;  Surgeon: Steven LELON Clay, MD;  Location[facility] CATH LAB;  Service: Cardiovascular;  Laterality: N/A;  Minimal CAD w/ widely patent RCA stents;  well-preserved LVEF w/ normal EDP    PENILE PROSTHESIS IMPLANT N/A 09/19/2016   Procedure: INSERTION SEMI-RIGID PENILE PROSTHESIS COLOPLAST;  Surgeon: Steven Rush, MD;  Location: [facility];  Service: Urology;  Laterality: N/A;   POLYPECTOMY  12/02/2020   Procedure: POLYPECTOMY;  Surgeon: Steven Lamar HERO, MD;  Location: [facility];  Service: Endoscopy;;  sigmoid    Social History:  reports that he quit smoking about 47 years ago. His smoking use included cigarettes. He started smoking about 60 years ago. He has a 1.3 pack-year smoking history. He has been exposed to tobacco smoke. He quit smokeless tobacco use about 55 years ago. He reports that he does not drink alcohol and does not use drugs.   Allergies[1]  Family History  Problem Relation Age of Onset   Colon cancer Mother 7   Colon cancer Brother 66       Rectal cancer   Pulmonary embolism Brother        Cancer   Colon cancer  Paternal Uncle 47   Colon cancer Paternal Uncle 60   Throat cancer Brother 9   Prostate cancer Brother       Prior to Admission medications  Medication Sig Start Date End Date Taking? Authorizing Provider  amLODipine  (NORVASC ) 5 MG tablet TAKE ONE TABLET BY MOUTH ONCE DAILY Patient taking differently: Take 5 mg by mouth daily. 01/03/17   Court Dorn PARAS, MD  Ascorbic Acid (VITAMIN C) 1000 MG tablet Take 1,000 mg by mouth daily.    [provider]  aspirin  EC 81 MG tablet Take 1 tablet (81 mg total) by mouth daily. Swallow whole. 01/10/22   Court Dorn PARAS, MD  atorvastatin  (LIPITOR ) 80 MG tablet Take 1 tablet (80 mg total) by mouth daily at 6 PM. Patient taking differently: Take 80 mg by mouth at bedtime. 08/31/17   Henry Manuelita NOVAK, NP  cholecalciferol (VITAMIN D3) 25 MCG (1000 UNIT) tablet Take 1,000 Units by mouth daily.  [provider]  diazepam  (VALIUM ) 10 MG tablet Take 10 mg by mouth in the morning, at noon, and at bedtime.    [provider]  dicyclomine  (BENTYL ) 10 MG capsule TAKE 1 CAPSULE BY MOUTH 3 TIMES DAILY BEFORE MEALS. 12/09/21   Steven Lamar HERO, MD  fenofibrate  (TRICOR ) 145 MG tablet TAKE 1 TABLET EVERY DAY 01/26/23   Court Dorn PARAS, MD  levothyroxine  (SYNTHROID ) 50 MCG tablet Take 50 mcg by mouth daily before breakfast. 08/18/20   [provider]  loperamide  (IMODIUM ) 2 MG capsule Take 1 capsule (2 mg total) by mouth 4 (four) times daily as needed for diarrhea or loose stools. 03/31/24   Garrick Lamar, MD  losartan  (COZAAR ) 25 MG tablet Take 1 tablet (25 mg total) by mouth daily. 02/16/21   Kroeger, Krista M., PA-C  meclizine (ANTIVERT) 25 MG tablet Take 25 mg by mouth as needed for dizziness (FOR INNER EAR).  Patient not taking: Reported on 01/02/2023 05/13/13   [provider]  metFORMIN (GLUCOPHAGE) 500 MG tablet Take 500 mg by mouth 3 (three) times daily. Take 2 tablets in morning and 1 tablet at night 12/05/22   [provider]  metoprolol  succinate (TOPROL -XL) 25 MG 24 hr tablet Take 25 mg by mouth daily. 11/08/20   [provider]  Multiple Vitamin (MULTIVITAMIN WITH MINERALS) TABS tablet Take 1 tablet by mouth daily.    [provider]  nitroGLYCERIN  (NITROSTAT ) 0.4 MG SL tablet Place 1 tablet (0.4 mg total) under the tongue every 5 (five) minutes as needed. 08/31/17   Henry Manuelita NOVAK, NP  Omega-3 Fatty Acids  (FISH OIL) 1200 MG CAPS Take 1,200 mg by mouth daily.    [provider]  omeprazole  (PRILOSEC) 40 MG capsule TAKE 1 CAPSULE (40 MG TOTAL) BY MOUTH DAILY. 09/07/22   Rourk, Lamar HERO, MD  warfarin (COUMADIN ) 4 MG tablet Take 4 mg by mouth daily. 11/08/22   [provider]    Physical Exam: BP 130/70   Pulse 84   Temp 98.2 F (36.8 C)   Resp 20   Wt 104.3 kg   SpO2 93%   BMI 31.19 kg/m   General: 79 y.o. year-old male weak appearing in no acute distress.  Alert and oriented x3. Cardiovascular: Regular rate and rhythm with no rubs or gallops.  No thyromegaly or JVD noted.  No lower extremity edema. 2/4 pulses in all 4 extremities. Respiratory: Clear to auscultation with no wheezes or rales. Good inspiratory effort. Abdomen: Soft nontender nondistended with normal bowel sounds x4 quadrants. Muskuloskeletal: No cyanosis, clubbing or edema noted bilaterally Neuro: CN II-XII intact, strength, sensation, reflexes Skin: No ulcerative lesions noted or rashes.  Left posterior scalp hematoma. Psychiatry: Judgement and insight appear normal. Mood is appropriate for condition and setting          Labs on Admission:  Basic Metabolic Panel: Recent Labs  Lab 05/28/24 1921  NA 139  K 4.3  CL 99  CO2 21*  GLUCOSE 168*  BUN 10  CREATININE 0.69  CALCIUM  9.1   Liver Function Tests: No results for input(s): AST, ALT, ALKPHOS, BILITOT, PROT, ALBUMIN in the last 168 hours. No results for input(s): LIPASE, AMYLASE in the last 168 hours. No results  for input(s): AMMONIA in the last 168 hours. CBC: Recent Labs  Lab 05/28/24 1921  WBC 12.2*  NEUTROABS 9.6*  HGB 13.5  HCT 40.1  MCV 88.3  PLT 250   Cardiac Enzymes: No results for input(s):  CKTOTAL, CKMB, CKMBINDEX, TROPONINI in the last 168 hours.  BNP (last 3 results) No results for input(s): BNP in the last 8760 hours.  ProBNP (last 3 results) No results for input(s): PROBNP in the last 8760 hours.  CBG: No results for input(s): GLUCAP in the last 168 hours.  Radiological Exams on Admission: DG HIP UNILAT WITH PELVIS 2-3 VIEWS RIGHT Result Date: 05/28/2024 EXAM: 2 or 3 VIEW(S) XRAY OF THE PELVIS AND RIGHT HIP 05/28/2024 09:20:00 PM COMPARISON: None available. CLINICAL HISTORY: Fall. FINDINGS: BONES AND JOINTS: SI joints are symmetric. No acute fracture. Bilateral hips demonstrate normal alignment. SOFT TISSUES: Vascular calcifications. IMPRESSION: 1. No acute fracture or dislocation. Electronically signed by: Elsie Gravely MD 05/28/2024 09:30 PM EST RP Workstation: [id number]   DG HIP UNILAT WITH PELVIS 2-3 VIEWS LEFT Result Date: 05/28/2024 EXAM: 2 OR 3 VIEW(S) XRAY OF THE UNILATERAL HIP 05/28/2024 09:20:00 PM COMPARISON: None available. CLINICAL HISTORY: Fall. FINDINGS: BONES AND JOINTS: No acute fracture. No malalignment. SOFT TISSUES: Partially seen penile prosthesis in place. Mild vascular calcifications. IMPRESSION: 1. No acute findings. Electronically signed by: Elsie Gravely MD 05/28/2024 09:30 PM EST RP Workstation: [id number]   DG Lumbar Spine Complete Result Date: 05/28/2024 EXAM: 4 OR MORE VIEW(S) XRAY OF THE LUMBAR SPINE 05/28/2024 09:20:00 PM COMPARISON: CT abdomen and pelvis 03/31/2024. CLINICAL HISTORY: Fall back pain. Fall and back pain. Slip and fall on ice. FINDINGS: LUMBAR SPINE: BONES: 5 lumbar type vertebral bodies. Vertebral body heights are maintained. Alignment is normal. Mild lumbar scoliosis convex towards the right. No anterior  subluxation. Normal alignment of the facet joints. No vertebral compression deformities. No focal bone lesion or bone destruction. Bone cortex appears intact. The sacrum appears intact. DISCS AND DEGENERATIVE CHANGES: Diffuse degenerative changes with intervertebral disc space narrowing and endplate osteophyte formation throughout. SOFT TISSUES: Vascular calcifications are visualized. Surgical clips are present in the right upper quadrant. No acute abnormality. IMPRESSION: 1. No evidence of acute traumatic injury. 2. Diffuse degenerative changes with intervertebral disc space narrowing and endplate osteophyte formation throughout. 3. Mild lumbar scoliosis convex towards the right. Electronically signed by: Elsie Gravely MD 05/28/2024 09:29 PM EST RP Workstation: [id number]   CT Head Wo Contrast Result Date: 05/28/2024 CLINICAL DATA:  Hit head on Coumadin  fell on ice EXAM: CT HEAD WITHOUT CONTRAST CT CERVICAL SPINE WITHOUT CONTRAST TECHNIQUE: Multidetector CT imaging of the head and cervical spine was performed following the standard protocol without intravenous contrast. Multiplanar CT image reconstructions of the cervical spine were also generated. RADIATION DOSE REDUCTION: This exam was performed according to the departmental dose-optimization program which includes automated exposure control, adjustment of the mA and/or kV according to patient size and/or use of iterative reconstruction technique. COMPARISON:  CT 09/09/2023 FINDINGS: CT HEAD FINDINGS Brain: No acute territorial infarction or intracranial mass is visualized. Positive 4 acute right convexity subdural hematoma, best seen on coronal series, measures 4 mm maximum thickness on series 7, image 51. Trace subdural hematoma along the inter hemispheric falx and the anterior falx, this measures about 2 mm maximum thickness. Trace focus of subarachnoid blood at the right parasagittal frontal lobe, series 3, image 27 and sagittal series 9, image 29. There  is also trace focus of subdural blood along the left tentorium, series 3, image 17 and sagittal series 9, image 35. There is atrophy and mild chronic small vessel ischemic changes of the white matter. The ventricles are stable in size. Vascular: No hyperdense vessels.  Carotid vascular calcification Skull: No fracture Sinuses/Orbits: No  acute finding. Other: None small left posterior scalp hematoma Traumatic Brain Injury Risk Stratification Skull Fracture: No - Low/mBIG 1 Subdural Hematoma (SDH): 4mm to <81mm - mBIG 2 Subarachnoid Hemorrhage [facility]): trace (up to 2 sulci) - Low/mBIG 1 Epidural Hematoma (EDH): No - Low/mBIG 1 Cerebral contusion, intra-axial, intraparenchymal Hemorrhage (IPH): No Intraventricular Hemorrhage (IVH): No - Low/mBIG 1 Midline Shift > 1mm or Edema/effacement of sulci/vents: No - Low/mBIG 1 ---------------------------------------------------- CT CERVICAL SPINE FINDINGS Alignment: Mild exaggeration of cervical lordosis. No subluxation. Facet alignment is within normal limits. Skull base and vertebrae: No acute fracture. No primary bone lesion or focal pathologic process. Probable accessory ossicle inferior to the anterior arch of C1. Soft tissues and spinal canal: No prevertebral fluid or swelling. No visible canal hematoma. Disc levels: Mild multilevel degenerative osteophytes. No high-grade canal stenosis. Multilevel facet degenerative changes with foraminal narrowing, most evident at C3-C4 Upper chest: Negative. Other: None IMPRESSION: 1. Acute right convexity subdural hematoma measuring up to 4 mm maximum thickness. Trace subdural hematoma along the inter hemispheric and anterior falx and left tentorium. Trace focus of subarachnoid blood at the right parasagittal frontal lobe. No midline shift. No mass effect 2. Atrophy and chronic small vessel ischemic changes of the white matter. 3. No CT evidence for acute osseous abnormality of the cervical spine. Critical Value/emergent results were  called by telephone at the time of interpretation on 05/28/2024 at 7:16 pm to provider [facility] , who verbally acknowledged these results. Electronically Signed   By: Luke Bun M.D.   On: 05/28/2024 19:16   CT Cervical Spine Wo Contrast Result Date: 05/28/2024 CLINICAL DATA:  Hit head on Coumadin  fell on ice EXAM: CT HEAD WITHOUT CONTRAST CT CERVICAL SPINE WITHOUT CONTRAST TECHNIQUE: Multidetector CT imaging of the head and cervical spine was performed following the standard protocol without intravenous contrast. Multiplanar CT image reconstructions of the cervical spine were also generated. RADIATION DOSE REDUCTION: This exam was performed according to the departmental dose-optimization program which includes automated exposure control, adjustment of the mA and/or kV according to patient size and/or use of iterative reconstruction technique. COMPARISON:  CT 09/09/2023 FINDINGS: CT HEAD FINDINGS Brain: No acute territorial infarction or intracranial mass is visualized. Positive 4 acute right convexity subdural hematoma, best seen on coronal series, measures 4 mm maximum thickness on series 7, image 51. Trace subdural hematoma along the inter hemispheric falx and the anterior falx, this measures about 2 mm maximum thickness. Trace focus of subarachnoid blood at the right parasagittal frontal lobe, series 3, image 27 and sagittal series 9, image 29. There is also trace focus of subdural blood along the left tentorium, series 3, image 17 and sagittal series 9, image 35. There is atrophy and mild chronic small vessel ischemic changes of the white matter. The ventricles are stable in size. Vascular: No hyperdense vessels.  Carotid vascular calcification Skull: No fracture Sinuses/Orbits: No acute finding. Other: None small left posterior scalp hematoma Traumatic Brain Injury Risk Stratification Skull Fracture: No - Low/mBIG 1 Subdural Hematoma (SDH): 4mm to <39mm - mBIG 2 Subarachnoid Hemorrhage [facility]): trace  (up to 2 sulci) - Low/mBIG 1 Epidural Hematoma (EDH): No - Low/mBIG 1 Cerebral contusion, intra-axial, intraparenchymal Hemorrhage (IPH): No Intraventricular Hemorrhage (IVH): No - Low/mBIG 1 Midline Shift > 1mm or Edema/effacement of sulci/vents: No - Low/mBIG 1 ---------------------------------------------------- CT CERVICAL SPINE FINDINGS Alignment: Mild exaggeration of cervical lordosis. No subluxation. Facet alignment is within normal limits. Skull base and vertebrae: No acute fracture. No primary bone lesion  or focal pathologic process. Probable accessory ossicle inferior to the anterior arch of C1. Soft tissues and spinal canal: No prevertebral fluid or swelling. No visible canal hematoma. Disc levels: Mild multilevel degenerative osteophytes. No high-grade canal stenosis. Multilevel facet degenerative changes with foraminal narrowing, most evident at C3-C4 Upper chest: Negative. Other: None IMPRESSION: 1. Acute right convexity subdural hematoma measuring up to 4 mm maximum thickness. Trace subdural hematoma along the inter hemispheric and anterior falx and left tentorium. Trace focus of subarachnoid blood at the right parasagittal frontal lobe. No midline shift. No mass effect 2. Atrophy and chronic small vessel ischemic changes of the white matter. 3. No CT evidence for acute osseous abnormality of the cervical spine. Critical Value/emergent results were called by telephone at the time of interpretation on 05/28/2024 at 7:16 pm to provider [facility] , who verbally acknowledged these results. Electronically Signed   By: Luke Bun M.D.   On: 05/28/2024 19:16    EKG: I independently viewed the EKG done and my findings are as followed: None available at the time of the visit.  Assessment/Plan Present on Admission:  Subdural hematoma (HCC)  Principal Problem:   Subdural hematoma (HCC)  Subdural hematoma, subarachnoid hemorrhage, seen on CT scan, post fall on ice, POA Follow repeat  noncontrast head CT No need to reverse Coumadin  at this time per on-call neurosurgery.  Hold Coumadin  for the next 2 weeks.  However, if repeat CT head shows worsening hematoma then reverse Coumadin  and reconsult neurosurgery. Blood pressure goal normotensive. Check with neurosurgery if the patient needs to be on AEDs during those 2 weeks.  History of PE and DVT on Coumadin  Continue to hold off home Coumadin  for the next 2 weeks as recommended by neurosurgery. The patient and his granddaughter who is a engineer, civil (consulting) were made aware of this.  Hypertension Resume home oral antihypertensives Closely monitor vital sign Goal blood pressure normotensive.  Hypothyroidism Resume home levothyroxine   Obesity BMI 31 Recommend weight loss outpatient regular physical activity and healthy diet.  Type 2 diabetes with hyperglycemia Serum glucose 168 Heart healthy carb modified diet Insulin coverage  Leukocytosis, reactive, secondary to trauma, fall and intracranial hemorrhage Presented with WBC of 12.2. Afebrile Nonseptic appearing Repeat CBC  Generalized weakness PT OT evaluation Fall precautions   Time: 75 minutes.   DVT prophylaxis: SCDs.  Code Status: Full code.  Family Communication: The patient's granddaughter at bedside.  Disposition Plan: Admitted to telemetry unit.  Consults called: EDP discussed the case with neurosurgery.  Admission status: Observation status.   Status is: Observation    Terry LOISE Hurst MD [facility] Pager [phone]  If 7PM-7AM, please contact night-coverage [email or web address] Password [email or web address]  05/28/2024, 9:58 PM      [1]  Allergies Allergen Reactions   Codeine Hives, Itching and Nausea Only   Niaspan  [Niacin  Er (Antihyperlipidemic)] Other (See Comments)    MAKES SKIN FLUSH EXCESSIVELY   "

## 2024-05-28 NOTE — ED Notes (Signed)
The patient has returned from xray

## 2024-05-28 NOTE — ED Notes (Signed)
 Patient transported to X-ray

## 2024-05-28 NOTE — ED Notes (Signed)
 CCMD has been notified of cardiac monitoring.

## 2024-05-28 NOTE — ED Triage Notes (Signed)
 Pt slipped on the ice outside and is unsure if he hit his head but family is concerned because he takes coumadin  and seem to be a little off.

## 2024-05-29 ENCOUNTER — Telehealth (HOSPITAL_COMMUNITY): Payer: Self-pay

## 2024-05-29 ENCOUNTER — Other Ambulatory Visit (HOSPITAL_COMMUNITY): Payer: Self-pay

## 2024-05-29 ENCOUNTER — Observation Stay (HOSPITAL_COMMUNITY)

## 2024-05-29 DIAGNOSIS — S065XAA Traumatic subdural hemorrhage with loss of consciousness status unknown, initial encounter: Secondary | ICD-10-CM | POA: Diagnosis not present

## 2024-05-29 LAB — CBC WITH DIFFERENTIAL/PLATELET
Abs Immature Granulocytes: 0.04 10*3/uL (ref 0.00–0.07)
Basophils Absolute: 0 10*3/uL (ref 0.0–0.1)
Basophils Relative: 0 %
Eosinophils Absolute: 0.2 10*3/uL (ref 0.0–0.5)
Eosinophils Relative: 2 %
HCT: 38.5 % — ABNORMAL LOW (ref 39.0–52.0)
Hemoglobin: 13 g/dL (ref 13.0–17.0)
Immature Granulocytes: 0 %
Lymphocytes Relative: 25 %
Lymphs Abs: 2.5 10*3/uL (ref 0.7–4.0)
MCH: 29.4 pg (ref 26.0–34.0)
MCHC: 33.8 g/dL (ref 30.0–36.0)
MCV: 87.1 fL (ref 80.0–100.0)
Monocytes Absolute: 0.8 10*3/uL (ref 0.1–1.0)
Monocytes Relative: 8 %
Neutro Abs: 6.5 10*3/uL (ref 1.7–7.7)
Neutrophils Relative %: 65 %
Platelets: 223 10*3/uL (ref 150–400)
RBC: 4.42 MIL/uL (ref 4.22–5.81)
RDW: 13.3 % (ref 11.5–15.5)
WBC: 10.1 10*3/uL (ref 4.0–10.5)
nRBC: 0 % (ref 0.0–0.2)

## 2024-05-29 LAB — BASIC METABOLIC PANEL WITH GFR
Anion gap: 12 (ref 5–15)
BUN: 6 mg/dL — ABNORMAL LOW (ref 8–23)
CO2: 28 mmol/L (ref 22–32)
Calcium: 9.1 mg/dL (ref 8.9–10.3)
Chloride: 102 mmol/L (ref 98–111)
Creatinine, Ser: 0.59 mg/dL — ABNORMAL LOW (ref 0.61–1.24)
GFR, Estimated: >60 mL/min
Glucose, Bld: 131 mg/dL — ABNORMAL HIGH (ref 70–99)
Potassium: 3.9 mmol/L (ref 3.5–5.1)
Sodium: 143 mmol/L (ref 135–145)

## 2024-05-29 MED ORDER — WARFARIN SODIUM 4 MG PO TABS: 4.0000 mg | ORAL_TABLET | Freq: Every day | ORAL | Status: AC

## 2024-05-29 MED ORDER — WARFARIN SODIUM 4 MG PO TABS: 4.0000 mg | ORAL_TABLET | Freq: Every day | ORAL | Status: DC

## 2024-05-29 NOTE — Evaluation (Signed)
 Physical Therapy Evaluation Patient Details Name: Steven Oneal MRN: [id number] DOB: 1945/10/02 Today's Date: 05/29/2024  History of Present Illness  Steven Oneal is a 79 y.o. male with medical history significant for PE/DVT on Coumadin , hypertension, hypothyroidism, type 2 diabetes, obesity, who presents to the ER after a fall on ice.  He hit his head and was very confused afterwards.  His neighbor saw him on the ground and assisted him back to his house.  The patient was in his usual state of health prior to this.  Denies feeling dizzy, having palpitations, or chest pain prior to the fall.  His granddaughter who is a nurse was called and made aware of his fall and had him call 911 due to being on blood thinners. (per DO)   Clinical Impression  Patient near baseline other requiring increased time for sitting up at bedside and having to walk slower during gait training, but no loss of balance or need for an AD. Patient tolerated sitting up bedside  after therapy - RN notified. Patient will benefit from continued skilled physical therapy in hospital and recommended venue below to increase strength, balance, endurance for safe ADLs and gait.          If plan is discharge home, recommend the following: A little help with walking and/or transfers;A little help with bathing/dressing/bathroom;Assistance with cooking/housework;Assist for transportation;Help with stairs or ramp for entrance   Can travel by private vehicle        Equipment Recommendations None recommended by PT  Recommendations for Other Services       Functional Status Assessment Patient has had a recent decline in their functional status and demonstrates the ability to make significant improvements in function in a reasonable and predictable amount of time.     Precautions / Restrictions Precautions Precautions: Fall Recall of Precautions/Restrictions: Intact Restrictions Weight Bearing Restrictions Per Provider  Order: No      Mobility  Bed Mobility Overal bed mobility: Modified Independent             General bed mobility comments: increased time, labord movement, had to pull self to sitting using bed sheets    Transfers Overall transfer level: Needs assistance   Transfers: Sit to/from Stand, Bed to chair/wheelchair/BSC Sit to Stand: Supervision   Step pivot transfers: Supervision       General transfer comment: slightly labored movement without need for an AD    Ambulation/Gait Ambulation/Gait assistance: Supervision, Contact guard assist Gait Distance (Feet): 100 Feet Assistive device: None Gait Pattern/deviations: Decreased step length - right, Decreased step length - left, Decreased stride length Gait velocity: decreased     General Gait Details: slightly labored movement without losss of balance or need for an AD, limited mostly due to fatigue  Stairs            Wheelchair Mobility     Tilt Bed    Modified Rankin (Stroke Patients Only)       Balance Overall balance assessment: Needs assistance Sitting-balance support: Feet supported, No upper extremity supported Sitting balance-Leahy Scale: Good Sitting balance - Comments: seated at EOB   Standing balance support: No upper extremity supported, During functional activity Standing balance-Leahy Scale: Fair Standing balance comment: without AD                             Pertinent Vitals/Pain Pain Assessment Pain Assessment: No/denies pain    Home Living Family/patient expects to be  discharged to:: Private residence Living Arrangements: Alone Available Help at Discharge: Family;Neighbor;Available PRN/intermittently Type of Home: House Home Access: Stairs to enter Entrance Stairs-Rails: None Entrance Stairs-Number of Steps: 1   Home Layout: Two level;Able to live on main level with bedroom/bathroom Home Equipment: Rolling Walker (2 wheels);Cane - single point;Wheelchair -  manual;Other (comment) (Bariatric w/c.) Additional Comments: Pt reports someone would be able to come stay with him a couple days.    Prior Function Prior Level of Function : Independent/Modified Independent;Driving             Mobility Comments: Independent ambulation. Drives. ADLs Comments: Independent.     Extremity/Trunk Assessment   Upper Extremity Assessment Upper Extremity Assessment: Defer to OT evaluation    Lower Extremity Assessment Lower Extremity Assessment: Generalized weakness    Cervical / Trunk Assessment Cervical / Trunk Assessment: Normal  Communication   Communication Communication: No apparent difficulties    Cognition Arousal: Alert Behavior During Therapy: WFL for tasks assessed/performed                             Following commands: Intact       Cueing Cueing Techniques: Verbal cues, Tactile cues     General Comments General comments (skin integrity, edema, etc.): Pt O2 saturation >90% on room air. Left on room air.    Exercises     Assessment/Plan    PT Assessment Patient needs continued PT services  PT Problem List Decreased strength;Decreased activity tolerance;Decreased balance;Decreased mobility       PT Treatment Interventions DME instruction;Gait training;Stair training;Functional mobility training;Therapeutic activities;Therapeutic exercise;Balance training;Patient/family education    PT Goals (Current goals can be found in the Care Plan section)  Acute Rehab PT Goals Patient Stated Goal: return home with family to assist PT Goal Formulation: With patient Time For Goal Achievement: 06/01/24    Frequency Min 3X/week     Co-evaluation PT/OT/SLP Co-Evaluation/Treatment: Yes Reason for Co-Treatment: To address functional/ADL transfers PT goals addressed during session: Mobility/safety with mobility;Balance OT goals addressed during session: ADL's and self-care       AM-PAC PT 6 Clicks Mobility   Outcome Measure Help needed turning from your back to your side while in a flat bed without using bedrails?: None Help needed moving from lying on your back to sitting on the side of a flat bed without using bedrails?: A Little Help needed moving to and from a bed to a chair (including a wheelchair)?: A Little Help needed standing up from a chair using your arms (e.g., wheelchair or bedside chair)?: None Help needed to walk in hospital room?: A Little Help needed climbing 3-5 steps with a railing? : A Little 6 Click Score: 20    End of Session   Activity Tolerance: Patient tolerated treatment well;Patient limited by fatigue Patient left: in bed;with call bell/phone within reach Nurse Communication: Mobility status PT Visit Diagnosis: Unsteadiness on feet (R26.81);Other abnormalities of gait and mobility (R26.89);Muscle weakness (generalized) (M62.81);History of falling (Z91.81)    Time: 8879-8864 PT Time Calculation (min) (ACUTE ONLY): 15 min   Charges:   PT Evaluation $PT Eval Low Complexity: 1 Low PT Treatments $Therapeutic Activity: 8-22 mins PT General Charges $$ ACUTE PT VISIT: 1 Visit         1:59 PM, 05/29/24 Lynwood Music, MPT Physical Therapist with [facility] [phone] office [phone] mobile phone

## 2024-05-29 NOTE — Plan of Care (Signed)
" °  Problem: Acute Rehab PT Goals(only PT should resolve) Goal: Pt Will Go Supine/Side To Sit Outcome: Progressing Flowsheets (Taken 05/29/2024 1400) Pt will go Supine/Side to Sit:  Independently  with modified independence Goal: Patient Will Transfer Sit To/From Stand Outcome: Progressing Flowsheets (Taken 05/29/2024 1400) Patient will transfer sit to/from stand:  Independently  with modified independence Goal: Pt Will Transfer Bed To Chair/Chair To Bed Outcome: Progressing Flowsheets (Taken 05/29/2024 1400) Pt will Transfer Bed to Chair/Chair to Bed:  Independently  with modified independence Goal: Pt Will Ambulate Outcome: Progressing Flowsheets (Taken 05/29/2024 1400) Pt will Ambulate:  > 125 feet  with modified independence  Independently   2:01 PM, 05/29/24 Lynwood Music, MPT Physical Therapist with [facility] [phone] office [phone] mobile phone  "

## 2024-05-29 NOTE — Discharge Summary (Addendum)
 " Physician Discharge Summary   Patient: Steven Oneal MRN: [id number] DOB: 1946-04-15  Admit date:     05/28/2024  Discharge date: 05/29/24  Discharge Physician: Eric Nunnery   PCP: Steven Raina Steven Oneal   Recommendations at discharge:  Repeat CBC to follow hemoglobin trend/stability Repeat basic metabolic panel to follow electrolytes and renal function Reassess blood pressure and adjust medication as needed Patient to resume Coumadin  and aspirin  on June 13, 2024 as per recommendation by neurosurgery service.  Discharge Diagnoses: Principal Problem:   Subdural hematoma (HCC) Coronary artery disease Hypothyroidism Class I obesity Hypertension History of pulmonary embolism and DVT on chronic Coumadin  Type 2 diabetes with hyperglycemia Stress demargination leukocytosis  Brief Oneal admission narrative: Steven Oneal is a 79 y.o. male with medical history significant for PE/DVT on Coumadin , hypertension, hypothyroidism, type 2 diabetes, obesity, who presents to the ER after a fall on ice.  He hit his head and was very confused afterwards.  His neighbor saw him on the ground and assisted him back to his house.  The patient was in his usual state of health prior to this.  Denies feeling dizzy, having palpitations, or chest pain prior to the fall.  His granddaughter who is a nurse was called and made aware of his fall and had him call 911 due to being on blood thinners.   In the ER, the patient is back to his baseline mentation.  A noncontrast head CT revealed stable 4 mm right convexity subdural hematoma and trace subarachnoid blood at the right parietal sagittal frontal lobe.  Stable left posterior scalp hematoma.  Chronic microvascular ischemic changes.   EDP discussed the case with neurosurgery who recommended repeating noncontrast head CT.  No need to reverse Coumadin  at this time per on-call neurosurgery.  Hold Coumadin  for the next 2 weeks.  However, if repeat  CT head shows worsening hematoma then reverse Coumadin  and reconsult neurosurgery.   Admitted by Steven Oneal, hospitalist service.   At the time of the visit, the patient is alert and oriented x 3.  Has no new complaints, pain is controlled.  His granddaughter is present at bedside.  The patient is interested in switching Coumadin  to Eliquis however he is worried about high co-pays.  TOC consulted to check co-pays.   ED Course: Temperature 98.3.  BP 133/59, pulse 84, respiratory 19, saturation 95% on room air.  Assessment and Plan: 1-subdural hematoma - Following mechanical fall and head trauma - Patient chronically on Coumadin  and aspirin  - Neurosurgery service was consulted recommended repeat CT scan and in the absence of worsening findings 2 weeks of anticoagulation without need for Coumadin  reversion recommended. - Patient will follow-up with PCP in 2 weeks - PT/OT has seen patient and found to be safe for discharge.  Consultants: Neurosurgery Procedures performed: See below for x-ray report. Disposition: Home Diet recommendation: Heart healthy/modify carbohydrates and low calorie diet.  DISCHARGE MEDICATION: Allergies as of 05/29/2024       Reactions   Codeine Hives, Itching, Nausea Only   Niaspan  [niacin  Er (antihyperlipidemic)] Other (See Comments)   MAKES SKIN FLUSH EXCESSIVELY        Medication List     STOP taking these medications    aspirin  EC 81 MG tablet   diazepam  10 MG tablet Commonly known as: VALIUM        TAKE these medications    acetaminophen  500 MG tablet Commonly known as: TYLENOL  Take 500 mg by mouth every 6 (six) hours as needed  for moderate pain (pain score 4-6).   amLODipine  5 MG tablet Commonly known as: NORVASC  TAKE ONE TABLET BY MOUTH ONCE DAILY   atorvastatin  80 MG tablet Commonly known as: LIPITOR  Take 1 tablet (80 mg total) by mouth daily at 6 PM. What changed: when to take this   cholecalciferol 25 MCG (1000 UNIT) tablet Commonly  known as: VITAMIN D3 Take 1,000 Units by mouth daily.   dicyclomine  10 MG capsule Commonly known as: BENTYL  TAKE 1 CAPSULE BY MOUTH 3 TIMES DAILY BEFORE MEALS.   fenofibrate  145 MG tablet Commonly known as: TRICOR  TAKE 1 TABLET EVERY DAY   Fish Oil 1200 MG Caps Take 1,200 mg by mouth daily.   levothyroxine  50 MCG tablet Commonly known as: SYNTHROID  Take 50 mcg by mouth daily before breakfast.   loperamide  2 MG capsule Commonly known as: IMODIUM  Take 1 capsule (2 mg total) by mouth 4 (four) times daily as needed for diarrhea or loose stools.   losartan  25 MG tablet Commonly known as: COZAAR  Take 1 tablet (25 mg total) by mouth daily.   meclizine 25 MG tablet Commonly known as: ANTIVERT Take 25 mg by mouth as needed for dizziness (FOR INNER EAR).   metFORMIN 500 MG tablet Commonly known as: GLUCOPHAGE Take 1,000 mg by mouth 2 (two) times daily with a meal.   metoprolol  succinate 25 MG 24 hr tablet Commonly known as: TOPROL -XL Take 25 mg by mouth daily.   multivitamin with minerals Tabs tablet Take 1 tablet by mouth daily.   nitroGLYCERIN  0.4 MG SL tablet Commonly known as: Nitrostat  Place 1 tablet (0.4 mg total) under the tongue every 5 (five) minutes as needed.   omeprazole  40 MG capsule Commonly known as: PRILOSEC TAKE 1 CAPSULE (40 MG TOTAL) BY MOUTH DAILY.   vitamin C 1000 MG tablet Take 1,000 mg by mouth daily.   warfarin 4 MG tablet Commonly known as: COUMADIN  Take 1 tablet (4 mg total) by mouth daily. Hold this medication for the next 2 weeks. Start taking on: June 13, 2024 What changed:  additional instructions These instructions start on June 13, 2024. If you are unsure what to do until then, ask your doctor or other care provider. Another medication with the same name was removed. Continue taking this medication, and follow the directions you see here.        Follow-up Information     [place], [place], Oneal. Schedule an  appointment as soon as possible for a visit in 10 day(s).   Contact information: [address] [place] Steven Oneal [place] [address] [phone]                Discharge Exam: Steven Oneal   05/28/24 1759  Weight: 104.3 kg   General exam: Alert, awake, oriented x 3 Respiratory system: Good saturation on room air. Cardiovascular system:RRR.  No rubs, no gallops, no JVD. Gastrointestinal system: Abdomen is obese, nondistended, soft and nontender. No organomegaly or masses felt. Normal bowel sounds heard. Central nervous system: No focal neurological deficits. Extremities: No cyanosis or clubbing. Skin: No petechiae. Psychiatry: Judgement and insight appear normal. Mood & affect appropriate.    Condition at discharge: Stable and improved.  The results of significant diagnostics from this hospitalization (including imaging, microbiology, ancillary and laboratory) are listed below for reference.   Imaging Studies: CT HEAD WO CONTRAST ( ) Result Date: 05/29/2024 EXAM: CT HEAD WITHOUT CONTRAST 05/29/2024 04:45:08 AM TECHNIQUE: CT of the head was performed without the administration of intravenous  contrast. Automated exposure control, iterative reconstruction, and/or weight based adjustment of the mA/kV was utilized to reduce the radiation dose to as low as reasonably achievable. COMPARISON: 05/28/2024 CLINICAL HISTORY: Subarachnoid hemorrhage; Subdural hematoma. FINDINGS: BRAIN AND VENTRICLES: Stable 4 mm right convexity subdural hematoma. Trace subarachnoid blood at right parasagittal frontal lobe. Periventricular white matter hypodensities consistent with chronic microvascular ischemic disease sequela. Vascular calcifications. No evidence of acute infarct. No hydrocephalus. No mass effect or midline shift. ORBITS: No acute abnormality. SINUSES: No acute abnormality. SOFT TISSUES AND SKULL: Stable left posterior scalp hematoma. No skull fracture. IMPRESSION: 1. Stable 4 mm right  convexity subdural hematoma and trace subarachnoid blood at the right parasagittal frontal lobe. 2. Stable left posterior scalp hematoma. 3. Chronic microvascular ischemic changes. Electronically signed by: Evalene Coho MD 05/29/2024 05:02 AM EST RP Workstation: [id number]   DG HIP UNILAT WITH PELVIS 2-3 VIEWS RIGHT Result Date: 05/28/2024 EXAM: 2 or 3 VIEW(S) XRAY OF THE PELVIS AND RIGHT HIP 05/28/2024 09:20:00 PM COMPARISON: None available. CLINICAL HISTORY: Fall. FINDINGS: BONES AND JOINTS: SI joints are symmetric. No acute fracture. Bilateral hips demonstrate normal alignment. SOFT TISSUES: Vascular calcifications. IMPRESSION: 1. No acute fracture or dislocation. Electronically signed by: Elsie Gravely MD 05/28/2024 09:30 PM EST RP Workstation: [id number]   DG HIP UNILAT WITH PELVIS 2-3 VIEWS LEFT Result Date: 05/28/2024 EXAM: 2 OR 3 VIEW(S) XRAY OF THE UNILATERAL HIP 05/28/2024 09:20:00 PM COMPARISON: None available. CLINICAL HISTORY: Fall. FINDINGS: BONES AND JOINTS: No acute fracture. No malalignment. SOFT TISSUES: Partially seen penile prosthesis in place. Mild vascular calcifications. IMPRESSION: 1. No acute findings. Electronically signed by: Elsie Gravely MD 05/28/2024 09:30 PM EST RP Workstation: [id number]   DG Lumbar Spine Complete Result Date: 05/28/2024 EXAM: 4 OR MORE VIEW(S) XRAY OF THE LUMBAR SPINE 05/28/2024 09:20:00 PM COMPARISON: CT abdomen and pelvis 03/31/2024. CLINICAL HISTORY: Fall back pain. Fall and back pain. Slip and fall on ice. FINDINGS: LUMBAR SPINE: BONES: 5 lumbar type vertebral bodies. Vertebral body heights are maintained. Alignment is normal. Mild lumbar scoliosis convex towards the right. No anterior subluxation. Normal alignment of the facet joints. No vertebral compression deformities. No focal bone lesion or bone destruction. Bone cortex appears intact. The sacrum appears intact. DISCS AND DEGENERATIVE CHANGES: Diffuse degenerative changes with  intervertebral disc space narrowing and endplate osteophyte formation throughout. SOFT TISSUES: Vascular calcifications are visualized. Surgical clips are present in the right upper quadrant. No acute abnormality. IMPRESSION: 1. No evidence of acute traumatic injury. 2. Diffuse degenerative changes with intervertebral disc space narrowing and endplate osteophyte formation throughout. 3. Mild lumbar scoliosis convex towards the right. Electronically signed by: Elsie Gravely MD 05/28/2024 09:29 PM EST RP Workstation: [id number]   CT Head Wo Contrast Result Date: 05/28/2024 CLINICAL DATA:  Hit head on Coumadin  fell on ice EXAM: CT HEAD WITHOUT CONTRAST CT CERVICAL SPINE WITHOUT CONTRAST TECHNIQUE: Multidetector CT imaging of the head and cervical spine was performed following the standard protocol without intravenous contrast. Multiplanar CT image reconstructions of the cervical spine were also generated. RADIATION DOSE REDUCTION: This exam was performed according to the departmental dose-optimization program which includes automated exposure control, adjustment of the mA and/or kV according to patient size and/or use of iterative reconstruction technique. COMPARISON:  CT 09/09/2023 FINDINGS: CT HEAD FINDINGS Brain: No acute territorial infarction or intracranial mass is visualized. Positive 4 acute right convexity subdural hematoma, best seen on coronal series, measures 4 mm maximum thickness on series 7, image 51. Trace subdural hematoma along the inter hemispheric  falx and the anterior falx, this measures about 2 mm maximum thickness. Trace focus of subarachnoid blood at the right parasagittal frontal lobe, series 3, image 27 and sagittal series 9, image 29. There is also trace focus of subdural blood along the left tentorium, series 3, image 17 and sagittal series 9, image 35. There is atrophy and mild chronic small vessel ischemic changes of the white matter. The ventricles are stable in size. Vascular: No  hyperdense vessels.  Carotid vascular calcification Skull: No fracture Sinuses/Orbits: No acute finding. Other: None small left posterior scalp hematoma Traumatic Brain Injury Risk Stratification Skull Fracture: No - Low/mBIG 1 Subdural Hematoma (SDH): 4mm to <78mm - mBIG 2 Subarachnoid Hemorrhage [facility]): trace (up to 2 sulci) - Low/mBIG 1 Epidural Hematoma (EDH): No - Low/mBIG 1 Cerebral contusion, intra-axial, intraparenchymal Hemorrhage (IPH): No Intraventricular Hemorrhage (IVH): No - Low/mBIG 1 Midline Shift > 1mm or Edema/effacement of sulci/vents: No - Low/mBIG 1 ---------------------------------------------------- CT CERVICAL SPINE FINDINGS Alignment: Mild exaggeration of cervical lordosis. No subluxation. Facet alignment is within normal limits. Skull base and vertebrae: No acute fracture. No primary bone lesion or focal pathologic process. Probable accessory ossicle inferior to the anterior arch of C1. Soft tissues and spinal canal: No prevertebral fluid or swelling. No visible canal hematoma. Disc levels: Mild multilevel degenerative osteophytes. No high-grade canal stenosis. Multilevel facet degenerative changes with foraminal narrowing, most evident at C3-C4 Upper chest: Negative. Other: None IMPRESSION: 1. Acute right convexity subdural hematoma measuring up to 4 mm maximum thickness. Trace subdural hematoma along the inter hemispheric and anterior falx and left tentorium. Trace focus of subarachnoid blood at the right parasagittal frontal lobe. No midline shift. No mass effect 2. Atrophy and chronic small vessel ischemic changes of the white matter. 3. No CT evidence for acute osseous abnormality of the cervical spine. Critical Value/emergent results were called by telephone at the time of interpretation on 05/28/2024 at 7:16 pm to provider [facility] , who verbally acknowledged these results. Electronically Signed   By: Luke Bun M.D.   On: 05/28/2024 19:16   CT Cervical Spine Wo  Contrast Result Date: 05/28/2024 CLINICAL DATA:  Hit head on Coumadin  fell on ice EXAM: CT HEAD WITHOUT CONTRAST CT CERVICAL SPINE WITHOUT CONTRAST TECHNIQUE: Multidetector CT imaging of the head and cervical spine was performed following the standard protocol without intravenous contrast. Multiplanar CT image reconstructions of the cervical spine were also generated. RADIATION DOSE REDUCTION: This exam was performed according to the departmental dose-optimization program which includes automated exposure control, adjustment of the mA and/or kV according to patient size and/or use of iterative reconstruction technique. COMPARISON:  CT 09/09/2023 FINDINGS: CT HEAD FINDINGS Brain: No acute territorial infarction or intracranial mass is visualized. Positive 4 acute right convexity subdural hematoma, best seen on coronal series, measures 4 mm maximum thickness on series 7, image 51. Trace subdural hematoma along the inter hemispheric falx and the anterior falx, this measures about 2 mm maximum thickness. Trace focus of subarachnoid blood at the right parasagittal frontal lobe, series 3, image 27 and sagittal series 9, image 29. There is also trace focus of subdural blood along the left tentorium, series 3, image 17 and sagittal series 9, image 35. There is atrophy and mild chronic small vessel ischemic changes of the white matter. The ventricles are stable in size. Vascular: No hyperdense vessels.  Carotid vascular calcification Skull: No fracture Sinuses/Orbits: No acute finding. Other: None small left posterior scalp hematoma Traumatic Brain Injury Risk Stratification Skull  Fracture: No - Low/mBIG 1 Subdural Hematoma (SDH): 4mm to <24mm - mBIG 2 Subarachnoid Hemorrhage [facility]): trace (up to 2 sulci) - Low/mBIG 1 Epidural Hematoma (EDH): No - Low/mBIG 1 Cerebral contusion, intra-axial, intraparenchymal Hemorrhage (IPH): No Intraventricular Hemorrhage (IVH): No - Low/mBIG 1 Midline Shift > 1mm or Edema/effacement of  sulci/vents: No - Low/mBIG 1 ---------------------------------------------------- CT CERVICAL SPINE FINDINGS Alignment: Mild exaggeration of cervical lordosis. No subluxation. Facet alignment is within normal limits. Skull base and vertebrae: No acute fracture. No primary bone lesion or focal pathologic process. Probable accessory ossicle inferior to the anterior arch of C1. Soft tissues and spinal canal: No prevertebral fluid or swelling. No visible canal hematoma. Disc levels: Mild multilevel degenerative osteophytes. No high-grade canal stenosis. Multilevel facet degenerative changes with foraminal narrowing, most evident at C3-C4 Upper chest: Negative. Other: None IMPRESSION: 1. Acute right convexity subdural hematoma measuring up to 4 mm maximum thickness. Trace subdural hematoma along the inter hemispheric and anterior falx and left tentorium. Trace focus of subarachnoid blood at the right parasagittal frontal lobe. No midline shift. No mass effect 2. Atrophy and chronic small vessel ischemic changes of the white matter. 3. No CT evidence for acute osseous abnormality of the cervical spine. Critical Value/emergent results were called by telephone at the time of interpretation on 05/28/2024 at 7:16 pm to provider [facility] , who verbally acknowledged these results. Electronically Signed   By: Luke Bun M.D.   On: 05/28/2024 19:16    Microbiology: Results for orders placed or performed during the Oneal encounter of 08/29/17  MRSA PCR Screening     Status: None   Collection Time: 08/29/17  6:42 PM   Specimen: Nasal Mucosa; Nasopharyngeal  Result Value Ref Range Status   MRSA by PCR NEGATIVE NEGATIVE Final    Comment:        The GeneXpert MRSA Assay (FDA approved for NASAL specimens only), is one component of a comprehensive MRSA colonization surveillance program. It is not intended to diagnose MRSA infection nor to guide or monitor treatment for MRSA infections. Performed at [facility], [address] [place], [place] [address]     Labs: CBC: Recent Labs  Lab 05/28/24 1921 05/29/24 0639  WBC 12.2* 10.1  NEUTROABS 9.6* 6.5  HGB 13.5 13.0  HCT 40.1 38.5*  MCV 88.3 87.1  PLT 250 223   Basic Metabolic Panel: Recent Labs  Lab 05/28/24 1921 05/29/24 0639  NA 139 143  K 4.3 3.9  CL 99 102  CO2 21* 28  GLUCOSE 168* 131*  BUN 10 6*  CREATININE 0.69 0.59*  CALCIUM  9.1 9.1     Discharge time spent:  35 minutes.  Signed: Eric Nunnery, MD [facility] 05/29/2024 "

## 2024-05-29 NOTE — Evaluation (Signed)
 Occupational Therapy Evaluation Patient Details Name: Steven Oneal MRN: [id number] DOB: January 08, 1946 Today's Date: 05/29/2024   History of Present Illness   Steven Oneal is a 79 y.o. male with medical history significant for PE/DVT on Coumadin , hypertension, hypothyroidism, type 2 diabetes, obesity, who presents to the ER after a fall on ice.  He hit his head and was very confused afterwards.  His neighbor saw him on the ground and assisted him back to his house.  The patient was in his usual state of health prior to this.  Denies feeling dizzy, having palpitations, or chest pain prior to the fall.  His granddaughter who is a nurse was called and made aware of his fall and had him call 911 due to being on blood thinners. (per DO)     Clinical Impressions Pt agreeable to OT and PT co-evaluation. Pt reportedly alert and oriented according to RN. Pt followed commands well and demonstrated no need for physical assist for bed mobility or ambulation without AD. Pt did require supervision for safety when standing/ambulating due to pt felling unsteady. Pt demonstrates ability to complete seated ADL's well. Pt reports he has someone who cane stay with him a couple days at home. Pt left in the bed with call bell within reach. Pt is not recommended for any further acute OT services and will be discharged to care of nursing staff for remaining length of stay.       If plan is discharge home, recommend the following:   A little help with walking and/or transfers;Help with stairs or ramp for entrance     Functional Status Assessment   Patient has had a recent decline in their functional status and demonstrates the ability to make significant improvements in function in a reasonable and predictable amount of time. (Deferring to PT for balance work.)     Equipment Recommendations   None recommended by OT              Precautions/Restrictions   Precautions Precautions:  Fall Recall of Precautions/Restrictions: Intact Restrictions [employer] Bearing Restrictions Per Provider Order: No     Mobility Bed Mobility Overal bed mobility: Modified Independent             General bed mobility comments: labored effort.    Transfers Overall transfer level: Needs assistance   Transfers: Sit to/from Stand, Bed to chair/wheelchair/BSC Sit to Stand: Supervision           General transfer comment: No AD needed for sit to stand followed by ambulation. SPV for safety due to pt feeling unsteady.      Balance Overall balance assessment: Mild deficits observed, not formally tested                                         ADL either performed or assessed with clinical judgement   ADL Overall ADL's : Modified independent                                       General ADL Comments: Supervision for ambulation and standing ADL tasks. No physical assist for seated ADL's.     Vision Baseline Vision/History: 1 Wears glasses Ability to See in Adequate Light: 0 Adequate Patient Visual Report: No change from baseline Vision Assessment?: No apparent visual deficits  Perception Perception: Not tested       Praxis Praxis: Not tested       Pertinent Vitals/Pain Pain Assessment Pain Assessment: No/denies pain     Extremity/Trunk Assessment Upper Extremity Assessment Upper Extremity Assessment: Overall WFL for tasks assessed   Lower Extremity Assessment Lower Extremity Assessment: Defer to PT evaluation   Cervical / Trunk Assessment Cervical / Trunk Assessment: Normal   Communication Communication Communication: No apparent difficulties   Cognition Arousal: Alert Behavior During Therapy: WFL for tasks assessed/performed Cognition: No apparent impairments                               Following commands: Intact       Cueing  General Comments   Cueing Techniques: Verbal cues;Tactile cues  Pt  O2 saturation >90% on room air. Left on room air.              Home Living Family/patient expects to be discharged to:: Private residence Living Arrangements: Alone Available Help at Discharge: Family;Neighbor;Available PRN/intermittently Type of Home: House Home Access: Stairs to enter [employer] of Steps: 1 Entrance Stairs-Rails: None Home Layout: Two level;Able to live on main level with bedroom/bathroom     Bathroom Shower/Tub: Producer, Television/film/video: Handicapped height Bathroom Accessibility: Yes How Accessible: Accessible via wheelchair;Accessible via walker Home Equipment: Rolling Walker (2 wheels);Cane - single point;Wheelchair - manual;Other (comment) (Bariatric w/c.)   Additional Comments: Pt reports someone would be able to come stay with him a couple days.      Prior Functioning/Environment Prior Level of Function : Independent/Modified Independent;Driving             Mobility Comments: Independent ambulation. Drives. ADLs Comments: Independent.                            Co-evaluation PT/OT/SLP Co-Evaluation/Treatment: Yes Reason for Co-Treatment: To address functional/ADL transfers   OT goals addressed during session: ADL's and self-care      AM-PAC OT 6 Clicks Daily Activity     Outcome Measure Help from another person eating meals?: None Help from another person taking care of personal grooming?: None Help from another person toileting, which includes using toliet, bedpan, or urinal?: None Help from another person bathing (including washing, rinsing, drying)?: None Help from another person to put on and taking off regular upper body clothing?: None Help from another person to put on and taking off regular lower body clothing?: None 6 Click Score: 24   End of Session    Activity Tolerance: Patient tolerated treatment well Patient left: in bed;with call bell/phone within reach  OT Visit Diagnosis:  Unsteadiness on feet (R26.81);Other abnormalities of gait and mobility (R26.89);Muscle weakness (generalized) (M62.81);History of falling (Z91.81)                Time: 8879-8863 OT Time Calculation (min): 16 min Charges:  OT General Charges $OT Visit: 1 Visit OT Evaluation $OT Eval Low Complexity: 1 Low  [name] OT, MOT  [name] 05/29/2024, 12:34 PM

## 2024-05-29 NOTE — TOC Transition Note (Addendum)
 Transition of Care [facility]) - Discharge Note   Patient Details  Name: Steven Oneal MRN: [id number] Date of Birth: Nov 06, 1945  Transition of [facility] CM/SW Contact:  Sharlyne Stabs, RN Phone Number: 05/29/2024, 1:56 PM   Clinical Narrative:   Patient discharging home, PT is recommending HHPT, not accepting agencies. Could not reach grand daughter. Out patient PT orders added for them call and discuss options with patient and family. Patient being discharged from ED.    Final next level of care: Home/Self Care Barriers to Discharge: No Home Care Agency will accept this patient   Patient Goals and CMS Choice Patient states their goals for this hospitalization and ongoing recovery are:: return home CMS Medicare.gov Compare Post Acute Care list provided to:: Patient Choice offered to / list presented to : Patient [place] ownership interest in [facility].provided to:: Patient    Discharge Placement          Patient and family notified of of transfer: 05/29/24  Discharge Plan and Services Additional resources added to the After Visit Summary for        Social Drivers of Health (SDOH) Interventions SDOH Screenings   Tobacco Use: Medium Risk (05/28/2024)

## 2024-05-29 NOTE — ED Notes (Signed)
 Pt placed on 2L NC at this time.

## 2024-05-29 NOTE — ED Notes (Signed)
 The patient has been wheeled to the restroom.

## 2024-05-29 NOTE — ED Notes (Signed)
 PT at bedside.

## 2024-05-29 NOTE — Telephone Encounter (Signed)
 Pharmacy Patient Advocate Encounter  Insurance verification completed.    The patient is insured through [employer]. Patient has Medicare and is not eligible for a copay card, but may be able to apply for patient assistance or Medicare RX Payment Plan (Patient Must reach out to their plan, if eligible for payment plan), if available.    Ran test claim for Eliquis  5mg  tablet and the current 30 day co-pay is $248.46 due to deductible.   This test claim was processed through [facility]- copay amounts may vary at other pharmacies due to pharmacy/plan contracts, or as the patient moves through the different stages of their insurance plan.
